# Patient Record
Sex: Female | Born: 1990 | Race: White | Hispanic: No | Marital: Single | Smoking: Current every day smoker
Health system: Southern US, Community
[De-identification: ages and names within clinical notes are randomized; demographics above are authoritative.]

## PROBLEM LIST (undated history)

## (undated) DIAGNOSIS — Z8489 Family history of other specified conditions: Secondary | ICD-10-CM

## (undated) DIAGNOSIS — Z789 Other specified health status: Secondary | ICD-10-CM

---
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
## 2015-03-14 ENCOUNTER — Inpatient Hospital Stay (HOSPITAL_COMMUNITY)
Admission: AD | Admit: 2015-03-14 | Discharge: 2015-03-17 | DRG: 885 | Disposition: A | Discharge status: Home / Self Care | Payer: BLUE CROSS/BLUE SHIELD | Attending: Psychiatry | Admitting: Psychiatry

## 2015-03-14 DIAGNOSIS — F17210 Nicotine dependence, cigarettes, uncomplicated: Secondary | ICD-10-CM | POA: Diagnosis present

## 2015-03-14 DIAGNOSIS — Z818 Family history of other mental and behavioral disorders: Secondary | ICD-10-CM | POA: Diagnosis not present

## 2015-03-14 DIAGNOSIS — F339 Major depressive disorder, recurrent, unspecified: Principal | ICD-10-CM | POA: Diagnosis present

## 2015-03-14 DIAGNOSIS — R45851 Suicidal ideations: Secondary | ICD-10-CM | POA: Diagnosis present

## 2015-03-14 DIAGNOSIS — F329 Major depressive disorder, single episode, unspecified: Secondary | ICD-10-CM | POA: Diagnosis present

## 2015-03-15 ENCOUNTER — Encounter (HOSPITAL_COMMUNITY): Payer: Self-pay

## 2015-03-15 DIAGNOSIS — F339 Major depressive disorder, recurrent, unspecified: Secondary | ICD-10-CM | POA: Diagnosis present

## 2015-03-15 LAB — COMPREHENSIVE METABOLIC PANEL
ALBUMIN: 3.8 g/dL (ref 3.5–5.0)
ALT: 13 U/L — ABNORMAL LOW (ref 14–54)
ANION GAP: 9 (ref 5–15)
AST: 17 U/L (ref 15–41)
Alkaline Phosphatase: 59 U/L (ref 38–126)
BILIRUBIN TOTAL: 0.9 mg/dL (ref 0.3–1.2)
BUN: 13 mg/dL (ref 6–20)
CHLORIDE: 107 mmol/L (ref 101–111)
CO2: 27 mmol/L (ref 22–32)
Calcium: 8.9 mg/dL (ref 8.9–10.3)
Creatinine, Ser: 0.77 mg/dL (ref 0.44–1.00)
GFR calc Af Amer: >60 mL/min (ref >60)
GFR calc non Af Amer: >60 mL/min (ref >60)
GLUCOSE: 92 mg/dL (ref 65–99)
POTASSIUM: 3.9 mmol/L (ref 3.5–5.1)
SODIUM: 143 mmol/L (ref 135–145)
Total Protein: 6.4 g/dL — ABNORMAL LOW (ref 6.5–8.1)

## 2015-03-15 LAB — TSH: TSH: 1.411 u[IU]/mL (ref 0.350–4.500)

## 2015-03-15 LAB — CBC
HEMATOCRIT: 36.2 % (ref 36.0–46.0)
Hemoglobin: 12.2 g/dL (ref 12.0–15.0)
MCH: 30.6 pg (ref 26.0–34.0)
MCHC: 33.7 g/dL (ref 30.0–36.0)
MCV: 90.7 fL (ref 78.0–100.0)
Platelets: 241 10*3/uL (ref 150–400)
RBC: 3.99 MIL/uL (ref 3.87–5.11)
RDW: 12.7 % (ref 11.5–15.5)
WBC: 7.7 10*3/uL (ref 4.0–10.5)

## 2015-03-15 LAB — LIPID PANEL
CHOL/HDL RATIO: 2.7 ratio
Cholesterol: 126 mg/dL (ref 0–200)
HDL: 46 mg/dL (ref >40)
LDL CALC: 66 mg/dL (ref 0–99)
Triglycerides: 71 mg/dL (ref <150)
VLDL: 14 mg/dL (ref 0–40)

## 2015-03-15 LAB — PREGNANCY, URINE: PREG TEST UR: NEGATIVE

## 2015-03-15 LAB — RAPID URINE DRUG SCREEN, HOSP PERFORMED
AMPHETAMINES: NOT DETECTED
Barbiturates: NOT DETECTED
Benzodiazepines: NOT DETECTED
Cocaine: NOT DETECTED
OPIATES: NOT DETECTED
Tetrahydrocannabinol: NOT DETECTED

## 2015-03-15 MED ORDER — SERTRALINE HCL 25 MG PO TABS
25.0000 mg | ORAL_TABLET | Freq: Every day | ORAL | Status: DC
  Administered 2015-03-15: 25 mg via ORAL
  Filled 2015-03-15 (×4): qty 1

## 2015-03-15 MED ORDER — ACETAMINOPHEN 325 MG PO TABS
650.0000 mg | ORAL_TABLET | Freq: Four times a day (QID) | ORAL | Status: DC | PRN
Start: 2015-03-15 — End: 2015-03-17

## 2015-03-15 MED ORDER — NICOTINE 21 MG/24HR TD PT24
21.0000 mg | MEDICATED_PATCH | Freq: Every day | TRANSDERMAL | Status: DC
  Administered 2015-03-15: 21 mg via TRANSDERMAL
  Filled 2015-03-15 (×4): qty 1

## 2015-03-15 MED ORDER — TRAZODONE HCL 50 MG PO TABS
50.0000 mg | ORAL_TABLET | Freq: Every evening | ORAL | Status: DC | PRN
  Administered 2015-03-15 – 2015-03-16 (×2): 50 mg via ORAL
  Filled 2015-03-15 (×2): qty 1

## 2015-03-15 MED ORDER — MAGNESIUM HYDROXIDE 400 MG/5ML PO SUSP
30.0000 mL | Freq: Every day | ORAL | Status: DC | PRN
Start: 2015-03-15 — End: 2015-03-17

## 2015-03-15 MED ORDER — ALUM & MAG HYDROXIDE-SIMETH 200-200-20 MG/5ML PO SUSP: 30.0000 mL | ORAL | Status: DC | PRN

## 2015-03-15 MED ORDER — ARIPIPRAZOLE 2 MG PO TABS
2.0000 mg | ORAL_TABLET | Freq: Every day | ORAL | Status: DC
  Administered 2015-03-15 – 2015-03-16 (×2): 2 mg via ORAL
  Filled 2015-03-15 (×4): qty 1

## 2015-03-15 NOTE — [facility] Group Notes (Signed)
[facility] LCSW Group Therapy 03/15/2015  1:15 pm  Type of Therapy: Group Therapy Participation Level: Active  Participation Quality: Attentive, Sharing and Supportive  Affect: Appropriate  Cognitive: Alert and Oriented  Insight: Developing/Improving and Engaged  Engagement in Therapy: Developing/Improving and Engaged  Modes of Intervention: Clarification, Confrontation, Discussion, Education, Exploration,  Limit-setting, Orientation, Problem-solving, Rapport Building, Dance movement psychotherapist, Socialization and Support  Summary of Progress/Problems: Pt identified obstacles faced currently and processed barriers involved in overcoming these obstacles. Pt identified steps necessary for overcoming these obstacles and explored motivation (internal and external) for facing these difficulties head on. Pt further identified one area of concern in their lives and chose a goal to focus on for today. Patient entered group late. She discussed the importance of artistic expression in coping with obstacles but shared that she has lost interest recently due to her depression. CSW and other group members provided patient with emotional support and encouragement.   Samuella Bruin, LCSW Clinical Social Worker [facility] [phone]

## 2015-03-15 NOTE — Progress Notes (Signed)
Patient ID: Jennifer York, female   DOB: 1990-06-28, 25 y.o.   MRN: [id number] Admission note: D:Patient is a voluntary admission in no acute distress for depression, anxiety and suicide ideation. Pt is a bisexual, Dietitian reports she was prescribed Prozac 20 mg 4 months ago. Pt report she didn't think the medication has been effective because she has been having increase suicidal ideation for the past 2 weeks. Pt stopped taking her medication on 03/12/2015. Pt reports past hx of cutting. Pt endorses passive ideation without a plan, contracts to come to staff. A: Pt admitted to unit per protocol, skin assessment and belonging search done. Pt has Multiple tattoos and piercing. Consent signed by pt. Pt educated on therapeutic milieu rules. Pt was introduced to milieu by nursing staff. Fall risk safety plan explained to the patient. 15 minutes checks started for safety. R: Pt was receptive to education. Writer offered support.

## 2015-03-15 NOTE — H&P (Signed)
Psychiatric Admission Assessment Adult  Patient Identification: Jennifer York MRN:  [id number] Date of Evaluation:  03/15/2015 Chief Complaint:  mdd Principal Diagnosis: Major depressive disorder, recurrent, unspecified ([place]) Diagnosis:   Patient Active Problem List   Diagnosis Date Noted  . Major depressive disorder, recurrent, unspecified ([place]) [F33.9] 03/15/2015   History of Present Illness:Jennifer York is a Caucasian, employed, single 25 y.o. 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 as a walk-in accompanied by 2 friends. She c/o worsening depression with SI (no plan or intent), stating she found herself searching for a gun in her friend's home earlier today. She says she didn't know what she planned to do if she were to actually locate a gun, but it scared her that she had gotten to the point of even seeking one out. She says she wouldn't "rule out" the possibility that she would've ended her life, but she does say that she doesn't think she would've been able to do it. Luckily, the pt was unable to find a gun and instead came to [facility] for help. Pt reports struggling with depression since about age 11 and SI short after. She endorses a hx of self-harm via cutting, beginning at age 34. She reports the last time she cut was about 6 months ago. Pt endorses depressive sx including hopelessness, fluctuating appetite, lack of motivation, fatigue, insomnia, feelings of guilt, loss of interest in usual pleasures, increased irritability, and social isolation. She also c/o anxiety, feeling on edge, and nervous tics (e.g. muscle spasms, facial tics, etc). In addition, she endorses OCD-type sx including obsessive, ruminating thoughts about SI and self-harm, which she worries "may never go away". Compulsive bx include a desire to make things "even" or "symmetrical", but it is unclear at this point if the behaviors are severe enough to warrant OCD dx and will need further evaluation.  Pt  has been under the care of psychiatrist Rosezella Rumpf at [facility] and therapist Leretha Dykes at [facility] for the past year. She has been taking Prozac for the past 4-5 months and feels that it is not helping to control her sx. She has not taken it the past 2 days for this reason but is usually med compliant. In the past, she received outpatient counseling at age 51 for 7 months, but she cannot recall the name of the agency or clinicians. Pt is a Paramedic at [employer] and reports some stressors related to school but nothing out of the ordinary. Other stressors include her belief that her medications are not helping to control her mental health symptoms. She denies any hx of suicide attempt and reports no previous hospitalizations. No hx of violent behavior or legal problems. Pt denies A/VH, HI, and SA. She reports binge drinking at parties every 3-4 months on average with last use last night of an unknown quantity. Family hx is positive for depression, SA, and alcoholism. Pt denies hx of any abuse or trauma. Pt hesitated when asked if she can contract for safety and says she is unsure if she could promise not to act on her thoughts to harm herself. She does feel that inpt tx could be beneficial and is open to it. Pt presents with pleasant mood and level affect. She is smiling and polite throughout assessment and appears to be a good historian. She is oriented x4 and cooperative and forthcoming with information. She maintains good eye-contact and concentration, speech is of normal tone and rate, and thought process is logical and relevant  with no indication of delusional content. Pt does not appear to be responding to internal stimuli and no evidence of psychosis noted.  On Evaluation: Patient is awake, alert,and oriented *4 , found resting in bedroom.  Denies suicidal or homicidal ideation. Denies auditory or visual hallucination and does not appear to be responding to internal stimuli. Patient reports she was  medication compliant with Prozac, however this medication is no longer helping her.  States is has bouts of  Depression and feels like her depression  2/10. Patient reports her last inpatient hospitalization was a few years ago. Support, encouragement and reassurance was provided.   Associated Signs/Symptoms: Depression Symptoms:  depressed mood, difficulty concentrating, hopelessness, suicidal thoughts with specific plan, anxiety, (Hypo) Manic Symptoms:  Irritable Mood, Anxiety Symptoms:  Excessive Worry, Psychotic Symptoms:  Hallucinations: None PTSD Symptoms: Avoidance:  Decreased Interest/Participation Total Time spent with patient: 30 minutes  Past Psychiatric History: See Above  Is the patient at risk to self? No.  Has the patient been a risk to self in the past 6 months? No.  Has the patient been a risk to self within the distant past? No.  Is the patient a risk to others? No.  Has the patient been a risk to others in the past 6 months? No.  Has the patient been a risk to others within the distant past? No.   Prior Inpatient Therapy: Prior Inpatient Therapy: No Prior Therapy Dates: na Prior Therapy Facilty/Provider(s): na Reason for Treatment: na Prior Outpatient Therapy: Prior Outpatient Therapy: Yes Prior Therapy Dates: 2012/2013 (7 months) Prior Therapy Facilty/Provider(s): Unable to recall name(s) - Received counseling Reason for Treatment: Depression Does patient have an ACCT team?: No Does patient have Intensive In-House Services?  : No Does patient have [facility] services? : No Does patient have P4CC services?: No  Alcohol Screening: 1. How often do you have a drink containing alcohol?: Never 9. Have you or someone else been injured as a result of your drinking?: No 10. Has a relative or friend or a doctor or another health worker been concerned about your drinking or suggested you cut down?: No Alcohol Use Disorder Identification Test Final Score (AUDIT): 0 Brief  Intervention: AUDIT score less than 7 or less-screening does not suggest unhealthy drinking-brief intervention not indicated Substance Abuse History in the last 12 months:  No. Consequences of Substance Abuse: NA Previous Psychotropic Medications: NO Psychological Evaluations: YES Past Medical History: History reviewed. No pertinent past medical history. History reviewed. No pertinent past surgical history. Family History:  Family History  Problem Relation Age of Onset  . Depression Mother   . Depression Father   . Depression Brother    Family Psychiatric  History:Unknown Tobacco Screening: @FLOW [phone]::1)@ Social History:  History  Alcohol Use: Not on file     History  Drug Use Not on file    Additional Social History: Marital status: Single    Pain Medications: None, per pt Prescriptions: Prozac Over the Counter: None, per pt History of alcohol / drug use?: No history of alcohol / drug abuse Longest period of sobriety (when/how long): Pt drinking socially once every 3-4 months                    Allergies:  Not on File Lab Results:  Results for orders placed or performed during the hospital encounter of 03/14/15 (from the past 48 hour(s))  CBC     Status: None   Collection Time: 03/15/15  6:41 AM  Result  Value Ref Range   WBC 7.7 4.0 - 10.5 K/uL   RBC 3.99 3.87 - 5.11 MIL/uL   Hemoglobin 12.2 12.0 - 15.0 g/dL   HCT 36.2 36.0 - 46.0 %   MCV 90.7 78.0 - 100.0 fL   MCH 30.6 26.0 - 34.0 pg   MCHC 33.7 30.0 - 36.0 g/dL   RDW 12.7 11.5 - 15.5 %   Platelets 241 150 - 400 K/uL    Comment: Performed at [facility]  Comprehensive metabolic panel     Status: Abnormal   Collection Time: 03/15/15  6:41 AM  Result Value Ref Range   Sodium 143 135 - 145 mmol/L   Potassium 3.9 3.5 - 5.1 mmol/L   Chloride 107 101 - 111 mmol/L   CO2 27 22 - 32 mmol/L   Glucose, Bld 92 65 - 99 mg/dL   BUN 13 6 - 20 mg/dL   Creatinine, Ser 0.77 0.44 - 1.00  mg/dL   Calcium 8.9 8.9 - 10.3 mg/dL   Total Protein 6.4 (L) 6.5 - 8.1 g/dL   Albumin 3.8 3.5 - 5.0 g/dL   AST 17 15 - 41 U/L   ALT 13 (L) 14 - 54 U/L   Alkaline Phosphatase 59 38 - 126 U/L   Total Bilirubin 0.9 0.3 - 1.2 mg/dL   GFR calc non Af Amer >60 >60 mL/min   GFR calc Af Amer >60 >60 mL/min    Comment: (NOTE) The eGFR has been calculated using the CKD EPI equation. This calculation has not been validated in all clinical situations. eGFR's persistently <60 mL/min signify possible Chronic Kidney Disease.    Anion gap 9 5 - 15    Comment: Performed at [facility]  Lipid panel, fasting     Status: None   Collection Time: 03/15/15  6:41 AM  Result Value Ref Range   Cholesterol 126 0 - 200 mg/dL   Triglycerides 71 <150 mg/dL   HDL 46 >40 mg/dL   Total CHOL/HDL Ratio 2.7 RATIO   VLDL 14 0 - 40 mg/dL   LDL Cholesterol 66 0 - 99 mg/dL    Comment:        Total Cholesterol/HDL:CHD Risk Coronary Heart Disease Risk Table                     Men   Women  1/2 Average Risk   3.4   3.3  Average Risk       5.0   4.4  2 X Average Risk   9.6   7.1  3 X Average Risk  23.4   11.0        Use the calculated Patient Ratio above and the CHD Risk Table to determine the patient's CHD Risk.        ATP III CLASSIFICATION (LDL):  <100     mg/dL   Optimal  100-129  mg/dL   Near or Above                    Optimal  130-159  mg/dL   Borderline  160-189  mg/dL   High  >190     mg/dL   Very High Performed at [facility]   TSH     Status: None   Collection Time: 03/15/15  6:41 AM  Result Value Ref Range   TSH 1.411 0.350 - 4.500 uIU/mL    Comment: Performed at [facility]  Blood Alcohol level:  No results found for: [facility]  Metabolic Disorder Labs:  No results found for: HGBA1C, MPG No results found for: PROLACTIN Lab Results  Component Value Date   CHOL 126 03/15/2015   TRIG 71 03/15/2015   HDL 46 03/15/2015   CHOLHDL 2.7 03/15/2015    VLDL 14 03/15/2015   LDLCALC 66 03/15/2015    Current Medications: Current Facility-Administered Medications  Medication Dose Route Frequency Provider Last Rate Last Dose  . acetaminophen (TYLENOL) tablet 650 mg  650 mg Oral Q6H PRN Harriet Butte, NP      . alum & mag hydroxide-simeth (MAALOX/MYLANTA) 200-200-20 MG/5ML suspension 30 mL  30 mL Oral Q4H PRN Harriet Butte, NP      . ARIPiprazole (ABILIFY) tablet 2 mg  2 mg Oral QHS Saramma Eappen, MD      . magnesium hydroxide (MILK OF MAGNESIA) suspension 30 mL  30 mL Oral Daily PRN Harriet Butte, NP      . nicotine (NICODERM CQ - dosed in mg/24 hours) patch 21 mg  21 mg Transdermal Daily Encarnacion Slates, NP   21 mg at 03/15/15 1358  . sertraline (ZOLOFT) tablet 25 mg  25 mg Oral Daily Saramma Eappen, MD   25 mg at 03/15/15 1315  . traZODone (DESYREL) tablet 50 mg  50 mg Oral QHS PRN Harriet Butte, NP   50 mg at 03/15/15 0100   PTA Medications: Prescriptions prior to admission  Medication Sig Dispense Refill Last Dose  . FLUoxetine (PROZAC) 20 MG capsule Take 20 mg by mouth daily.   03/12/2015    Musculoskeletal: Strength & Muscle Tone: within normal limits Gait & Station: normal Patient leans: N/A  Psychiatric Specialty Exam: Physical Exam  Vitals reviewed. Constitutional: She is oriented to person, place, and time. She appears well-developed.  Cardiovascular: Normal rate.   Musculoskeletal: Normal range of motion.  Neurological: She is alert and oriented to person, place, and time.  Skin: Skin is dry.  Psychiatric: She has a normal mood and affect. Her behavior is normal.    Review of Systems  Psychiatric/Behavioral: Positive for depression and suicidal ideas. The patient is nervous/anxious.   All other systems reviewed and are negative.   Blood pressure 95/62, pulse 90, temperature 98.6 F (37 C), temperature source Oral, resp. rate 16, height 5' 5"  (1.651 m), weight 66.225 kg (146 lb).Body mass index is 24.3  kg/(m^2).  General Appearance: Casual  Eye Contact::  Good  Speech:  Clear and Coherent  Volume:  Normal  Mood:  Anxious  Affect:  Congruent  Thought Process:  Coherent, Intact and Logical  Orientation:  Full (Time, Place, and Person)  Thought Content:  Hallucinations: None  Suicidal Thoughts:  No denies ideations today  Homicidal Thoughts:  No  Memory:  Immediate;   Fair Recent;   Fair Remote;   Fair  Judgement:  Fair  Insight:  Fair  Psychomotor Activity:  Normal  Concentration:  Fair  Recall:  [employer] of Knowledge:Fair  Language: Fair  Akathisia:  No  Handed:  Right  AIMS (if indicated):     Assets:  Desire for Improvement Social Support  ADL's:  Intact  Cognition: WNL  Sleep:  Number of Hours: 3.25     MDTreatment Plan Summary: Daily contact with patient to assess and evaluate symptoms and progress in treatment and Medication management  Patient will benefit from inpatient treatment and stabilization.  Estimated length of stay is 5-7 days.  Reviewed past medical records,treatment plan.  Will DC Prozac for lack of efficacy as well as patient feels it made her more suicidal. Will start a trial of Zoloft 25 mg po daily for affective sx. Will add Abilify 2 mg po qhs to augment the effect of Zoloft as well as for mood sx. Could titrate higher as needed. Will make available PRN medications for anxiety /agitation.  Observation Level/Precautions:  15 minute checks  Laboratory:  CBC Chemistry Profile UDS UA- Lipid, pregnancy, prolactin , TSH, EKG pending results  Psychotherapy:  Individual and group session  Medications:  See above  Consultations:  Psychiatry  Discharge Concerns:  Safety, stabilization, and risk of access to medication and medication stabilization   Estimated LOS: 5-7 days  Other:     I certify that inpatient services furnished can reasonably be expected to improve the patient's condition.    Derrill Center, NP 2/20/20173:37 PM

## 2015-03-15 NOTE — [facility] Suicide Risk Assessment (Signed)
[facility] Admission Suicide Risk Assessment   Nursing information obtained from:  Patient, Review of record Demographic factors:  Adolescent or young adult, Caucasian, Gay, lesbian, or bisexual orientation Current Mental Status:  Self-harm thoughts, Suicidal ideation indicated by patient, Intention to act on suicide plan Loss Factors:  NA Historical Factors:  Prior suicide attempts Risk Reduction Factors:  Employed  Total Time spent with patient: 30 minutes Principal Problem: Major depressive disorder, recurrent, unspecified (HCC); R/O Bipolar type II versus Borderline personality do  Diagnosis:   Patient Active Problem List   Diagnosis Date Noted  . Major depressive disorder, recurrent, unspecified (HCC) [F33.9] 03/15/2015   Subjective Data: Patient states " I am always on the edge , I have a lot of anxiety sx. I also have periods when I am having a burst of energy and I am more functional and also periods when I feel down and depressed. I am chronically suicidal all the time , but that has been worsening to the point that I felt I needed to find a gun and shoot myself. I think Prozac was making my depression worse , but I was on the same dose for a long time.'   Continued Clinical Symptoms:  Alcohol Use Disorder Identification Test Final Score (AUDIT): 0 The "Alcohol Use Disorders Identification Test", Guidelines for Use in Primary Care, Second Edition.  World Science writer [facility]). Score between 0-7:  no or low risk or alcohol related problems. Score between 8-15:  moderate risk of alcohol related problems. Score between 16-19:  high risk of alcohol related problems. Score 20 or above:  warrants further diagnostic evaluation for alcohol dependence and treatment.   CLINICAL FACTORS:   Severe Anxiety and/or Agitation Alcohol/Substance Abuse/Dependencies Unstable or Poor Therapeutic Relationship   Musculoskeletal: Strength & Muscle Tone: within normal limits Gait & Station:  normal Patient leans: N/A  Psychiatric Specialty Exam: Review of Systems  Psychiatric/Behavioral: Positive for depression. The patient is nervous/anxious.   All other systems reviewed and are negative.   Blood pressure 95/62, pulse 90, temperature 98.6 F (37 C), temperature source Oral, resp. rate 16, height  (1.651 m), weight 66.225 kg (146 lb).Body mass index is 24.3 kg/(m^2).  General Appearance: Casual  Eye Contact::  Fair  Speech:  Clear and Coherent  Volume:  Normal  Mood:  Anxious and Depressed  Affect:  Appropriate  Thought Process:  Coherent  Orientation:  Full (Time, Place, and Person)  Thought Content:  Rumination  Suicidal Thoughts:  yes , chronic SI , had plan to shoot self prior to admission  Homicidal Thoughts:  No  Memory:  Immediate;   Fair Recent;   Fair Remote;   Fair  Judgement:  Fair  Insight:  Fair  Psychomotor Activity:  Normal  Concentration:  Fair  Recall:  [employer] of Knowledge:Fair  Language: Fair  Akathisia:  No    AIMS (if indicated):     Assets:  Desire for Improvement  Sleep:  Number of Hours: 3.25  Cognition: WNL  ADL's:  Intact    COGNITIVE FEATURES THAT CONTRIBUTE TO RISK:  Closed-mindedness, Polarized thinking and Thought constriction (tunnel vision)    SUICIDE RISK:   Moderate:  Frequent suicidal ideation with limited intensity, and duration, some specificity in terms of plans, no associated intent, good self-control, limited dysphoria/symptomatology, some risk factors present, and identifiable protective factors, including available and accessible social support.  PLAN OF CARE: Patient with chronic suicidality , some self injurious behavior in the past ,  mood sx - has periods of depression and ? Hypomania ( she is able to function at school and work during those times) - binges on alcohol on and off , and has anxiety sx, always feels as though she is on edge.   Hence will R/O Bipolar disorder type II, Versus Borderline  personality do/traits.    Patient will benefit from inpatient treatment and stabilization.  Estimated length of stay is 5-7 days.  Reviewed past medical records,treatment plan.  Will DC Prozac for lack of efficacy as well as patient feels it made her more suicidal. Will start a trial of Zoloft 25 mg po daily for affective sx. Will add Abilify 2 mg po qhs to augment the effect of Zoloft as well as for mood sx. Could titrate higher as needed. Will make available PRN medications for anxiety /agitation. Case discussed with Alcario Drought NP - please also see H&P for plan.    I certify that inpatient services furnished can reasonably be expected to improve the patient's condition.   Amelda Hapke, MD 03/15/2015, 12:45 PM

## 2015-03-15 NOTE — Plan of Care (Signed)
Problem: Ineffective individual coping Goal: STG: Patient will remain free from self harm Outcome: Progressing Remains free at this time, denies SI

## 2015-03-15 NOTE — B[facility] Counselor (Signed)
Disposition: Per Hulan Fess, NP, Pt meet inpt tx criteria. Per Binnie Rail, AC, Pt accepted to [facility] bed 404-2 under the care of Dr Jama Flavors. Pt informed of disposition and is in agreement. Voluntary paperwork completed.

## 2015-03-15 NOTE — Tx Team (Signed)
Initial Interdisciplinary Treatment Plan   PATIENT STRESSORS: Educational concerns Medication change or noncompliance   PATIENT STRENGTHS: Ability for insight Average or above average intelligence Capable of independent living Communication skills General fund of knowledge Motivation for treatment/growth   PROBLEM LIST: Problem List/Patient Goals Date to be addressed Date deferred Reason deferred Estimated date of resolution  depression 03/15/2015     Risk for suicide 03/15/2015     Medication noncompliance 03/15/2015     anxiety 03/15/2015     "learn better coping skills" 03/15/2015                              DISCHARGE CRITERIA:  Ability to meet basic life and health needs Improved stabilization in mood, thinking, and/or behavior Need for constant or close observation no longer present  PRELIMINARY DISCHARGE PLAN: Return to previous living arrangement Return to previous work or school arrangements  PATIENT/FAMIILY INVOLVEMENT: This treatment plan has been presented to and reviewed with the patient, Jennifer York,  The patient and family have been given the opportunity to ask questions and make suggestions.  [name], [name] 03/15/2015, 2:29 AM

## 2015-03-15 NOTE — [facility] Group Notes (Signed)
[facility] LCSW Aftercare Discharge Planning Group Note  03/15/2015  8:45 AM  Participation Quality: Did Not Attend. Patient invited to participate but declined.  Samuella Bruin, MSW, LCSW Clinical Social Worker [facility] [phone]

## 2015-03-15 NOTE — BH Assessment (Addendum)
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 She c/o worsening depression with SI (no plan or intent), stating she found herself searching for a gun in her friend's home earlier today. She says she didn't know what she planned to do if she were to actually locate a gun, but it scared her that she had gotten to the point of even seeking one out. She says she wouldn't "rule out" the possibility that she would've ended her life, but she does say that she doesn't think she would've been able to do it. Luckily, the pt was unable to find a gun and instead came to [facility] for help. Pt reports struggling with depression since about age 17 and SI short after. She endorses a hx of self-harm via cutting, beginning at age 31. She reports the last time she cut was about 6 months ago. Pt endorses depressive sx including hopelessness, fluctuating appetite, lack of motivation, fatigue, insomnia, feelings of guilt, loss of interest in usual pleasures, increased irritability, and social isolation. She also c/o anxiety, feeling on edge, and nervous tics (e.g. muscle spasms, facial tics, etc). In addition, she endorses OCD-type sx including obsessive, ruminating thoughts about SI and self-harm, which she worries "may never go away". Compulsive bx include a desire to make things "even" or "symmetrical", but it is unclear at this point if the behaviors are severe enough to warrant OCD dx and will need further evaluation.  Pt has been under the care of psychiatrist Bertis Ruddy at [facility] and therapist Ernest Haber at [facility] for the past year. She has been taking Prozac for the past 4-5 months and feels that it is not helping to control her sx. She has not taken it the past 2 days for this reason but is usually med compliant. In the past, she received outpatient counseling at age 36 for 7  months, but she cannot recall the name of the agency or clinicians. Pt is a Holiday representative at [employer] and reports some stressors related to school but nothing out of the ordinary. Other stressors include her belief that her medications are not helping to control her mental health symptoms. She denies any hx of suicide attempt and reports no previous hospitalizations. No hx of violent behavior or legal problems. Pt denies A/VH, HI, and SA. She reports binge drinking at parties every 3-4 months on average with last use last night of an unknown quantity. Family hx is positive for depression, SA, and alcoholism. Pt denies hx of any abuse or trauma. Pt hesitated when asked if she can contract for safety and says she is unsure if she could promise not to act on her thoughts to harm herself. She does feel that inpt tx could be beneficial and is open to it. Pt presents with pleasant mood and level affect. She is smiling and polite throughout assessment and appears to be a good historian. She is oriented x4 and cooperative and forthcoming with information. She maintains good eye-contact and concentration, speech is of normal tone and rate, and thought process is logical and relevant with no indication of delusional content. Pt does not appear to be responding to internal stimuli and no evidence of psychosis noted.  - Disposition: Per Hulan Fess, NP, Pt meet inpt tx criteria. Per Binnie Rail, AC, Pt accepted to [facility] bed 404-2 under the care of Dr Jama Flavors.  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  No past surgical history on file.  Family History: No family history on file.  Social History:  has no tobacco, alcohol, and drug history on file.  Additional Social History:  Alcohol / Drug Use Pain Medications: None, per pt Prescriptions: Prozac Over the Counter: None, per  pt History of alcohol / drug use?: No history of alcohol / drug abuse Longest period of sobriety (when/how long): Pt drinking socially once every 3-4 months  CIWA:   COWS:    PATIENT STRENGTHS: (choose at least two) Ability for insight Active sense of humor Average or above average intelligence Capable of independent living Communication skills Motivation for treatment/growth Physical Health Supportive family/friends Work skills  Allergies: Allergies not on file  Home Medications:  No prescriptions prior to admission    OB/GYN Status:  No LMP recorded.  General Assessment Data Location of Assessment: [facility] Assessment Services TTS Assessment: In system Is this a Tele or Face-to-Face Assessment?: Face-to-Face Is this an Initial Assessment or a Re-assessment for this encounter?: Initial Assessment Marital status: Single Maiden name: n/a Is patient pregnant?: No Pregnancy Status: No Living Arrangements: Non-relatives/Friends, Other (Comment) (Alternates b/t living w/family and friends) Can pt return to current living arrangement?: Yes Admission Status: Voluntary Is patient capable of signing voluntary admission?: Yes Referral Source: Self/Family/Friend Insurance type: BCBS-Student  Medical Screening Exam [facility] Walk-in ONLY) Medical Exam completed: No Reason for MSE not completed: Other:  Crisis Care Plan Living Arrangements: Non-relatives/Friends, Other (Comment) (Alternates b/t living w/family and friends) Legal Guardian:  (N/A) Name of Psychiatrist: [facility] Bertis Ruddy) Name of Therapist: Haroldine Laws Cristino Martes)  Education Status Is patient currently in school?: Yes Current Grade: Junior Highest grade of school patient has completed: Sophomore yr of college Name of school: Haematologist person: Patient  Risk to self with the past 6 months Suicidal Ideation: Yes-Currently Present Has patient been a risk to self within the past 6 months prior to admission? :  Yes Suicidal Intent: No Has patient had any suicidal intent within the past 6 months prior to admission? : No Is patient at risk for suicide?: Yes Suicidal Plan?: Yes-Currently Present Has patient had any suicidal plan within the past 6 months prior to admission? : No Specify Current Suicidal Plan: Pt was looking for gun in her friend's house earlier today Access to Means: No Specify Access to Suicidal Means: Friends have firearms/guns but pt has none of own What has been your use of drugs/alcohol within the last 12 months?: Social drinking at parties once every 3-4 months Previous Attempts/Gestures: No How many times?: 0 Other Self Harm Risks: None known Triggers for Past Attempts: None known Intentional Self Injurious Behavior: None Family Suicide History: No Recent stressful life event(s): Financial Problems, Other (Comment) (School and financial stressors, Feels meds not helping) Persecutory voices/beliefs?: No Depression: Yes Depression Symptoms: Despondent, Insomnia, Isolating, Fatigue, Guilt, Loss of interest in usual pleasures, Feeling angry/irritable (+ fluctuating appetite, lack of motivation, hopelessness) Substance abuse history and/or treatment for substance abuse?: No Suicide prevention information given to non-admitted patients: Not applicable  Risk to Others within the past 6 months Homicidal Ideation: No Does patient have any lifetime risk of violence toward others beyond the six months prior to admission? : No Thoughts of Harm to Others: No Current Homicidal Intent: No Current Homicidal Plan: No Access to Homicidal Means: No Identified Victim:  n/a History of harm to others?: No Assessment of Violence: None Noted Violent Behavior Description: No hx of violence; Pt very calm and cooperative Does patient have access to weapons?: No Criminal Charges Pending?: No Does patient have a court date: No Is patient on probation?: No  Psychosis Hallucinations: None  noted Delusions: None noted  Mental Status Report Appearance/Hygiene: Other (Comment) (Tank top, shorts, multiple tattoos; Unique) Eye Contact: Good Motor Activity: Freedom of movement Speech: Logical/coherent Level of Consciousness: Alert Mood: Pleasant Affect: Inconsistent with thought content, Other (Comment) (Smiling, pleasant, level affect) Anxiety Level: Minimal Thought Processes: Coherent, Relevant Judgement: Partial Orientation: Person, Place, Time, Situation Obsessive Compulsive Thoughts/Behaviors: Moderate (Rumination on morbid thoughts, thoughts of death/self-harm)  Cognitive Functioning Concentration: Normal Memory: Recent Intact, Remote Intact IQ: Average Insight: Fair Impulse Control: Fair Appetite: Fair (Fluctuates b/t lack of appetite and overeating) Weight Loss: 0 Weight Gain: 0 Sleep: Decreased Total Hours of Sleep: 6 Vegetative Symptoms: None  ADLScreening [facility] Assessment Services) Patient's cognitive ability adequate to safely complete daily activities?: Yes Patient able to express need for assistance with ADLs?: Yes Independently performs ADLs?: Yes (appropriate for developmental age)  Prior Inpatient Therapy Prior Inpatient Therapy: No Prior Therapy Dates: na Prior Therapy Facilty/Provider(s): na Reason for Treatment: na  Prior Outpatient Therapy Prior Outpatient Therapy: Yes Prior Therapy Dates: 2012/2013 (7 months) Prior Therapy Facilty/Provider(s): Unable to recall name(s) - Received counseling Reason for Treatment: Depression Does patient have an ACCT team?: No Does patient have Intensive In-House Services?  : No Does patient have [facility] services? : No Does patient have P4CC services?: No  ADL Screening (condition at time of admission) Patient's cognitive ability adequate to safely complete daily activities?: Yes Is the patient deaf or have difficulty hearing?: No Does the patient have difficulty seeing, even when wearing  glasses/contacts?: No Does the patient have difficulty concentrating, remembering, or making decisions?: No Patient able to express need for assistance with ADLs?: Yes Does the patient have difficulty dressing or bathing?: No Independently performs ADLs?: Yes (appropriate for developmental age) Does the patient have difficulty walking or climbing stairs?: No Weakness of Legs: None Weakness of Arms/Hands: None  Home Assistive Devices/Equipment Home Assistive Devices/Equipment: None    Abuse/Neglect Assessment (Assessment to be complete while patient is alone) Physical Abuse: Denies Verbal Abuse: Denies Sexual Abuse: Denies Exploitation of patient/patient's resources: Denies Self-Neglect: Denies Values / Beliefs Cultural Requests During Hospitalization: None Spiritual Requests During Hospitalization: None   Advance Directives (For Healthcare) Does patient have an advance directive?: No Would patient like information on creating an advanced directive?: No - patient declined information    Additional Information 1:1 In Past 12 Months?: No CIRT Risk: No Elopement Risk: No Does patient have medical clearance?: No     Disposition: Per Hulan Fess, NP, Pt meet inpt tx criteria. Per Binnie Rail, AC, Pt accepted to [facility] bed 404-2 under the care of Dr Jama Flavors. Disposition Initial Assessment Completed for this Encounter: Yes Disposition of Patient: Inpatient treatment program Type of inpatient treatment program: Adult (Per Hulan Fess, NP, Inpt tx, 400 hall recommended.)  Cyndie Mull, [facility]  [facility] - TTS 03/15/2015 1:25 AM

## 2015-03-15 NOTE — BHH Counselor (Signed)
Jennifer York is a white, single 25 y.o. 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 Pt c/o worsening depression with SI and ruminating thoughts about self-harm. No plan or intent. She says she found herself searching for a gun in her friend's home earlier today. She didn't know what she had planned to do if she were to actually locate a gun, but it scared her that she was even seeking one out. Luckily, the pt was unable to find a gun and instead came to [facility] for help. She has no hx of suicide attempt or prior hospitalization. She is pleasant, open, and oriented x4. Normal thought process and speech. No indication of psychosis.  Pt wants inpatient treatment but is concerned about her snoring and that it will upset her roommate. Pt was reassured that staff would do whatever they could to address these concerns if they were to arise (e.g. give roommate ear plugs, etc). Pt voiced understanding and thanked counselor for her help.  Pt assigned to bed [facility] 404-2 under Dr Jama Flavors.   - Cyndie Mull, [facility]   Therapeutic Triage    [facility]

## 2015-03-15 NOTE — Progress Notes (Signed)
Patient ID: Jennifer York, female   DOB: [birth year], 25 y.o.   MRN: [id number]  DAR: Writer first met patient this morning when patient was in her bed laying down. She is very pleasant and polite with Probation officer. Her affect is bright and mood is euthymic. Pt. Denies SI/HI and A/V Hallucinations. She reports sleep last night was fair, energy level low, appetite is fair, and concentration is good. She rates depression, anxiety, and hopelessness 6/10. Patient does not report any pain or discomfort at this time. Support and encouragement provided to the patient to come to writer with any questions or concerns. No scheduled medications at this time and PRN medications not needed at this time either. Patient is receptive and cooperative. Q15 minute checks are maintained for safety.

## 2015-03-16 MED ORDER — SERTRALINE HCL 50 MG PO TABS
50.0000 mg | ORAL_TABLET | Freq: Every day | ORAL | Status: DC
  Administered 2015-03-17: 50 mg via ORAL
  Filled 2015-03-16 (×3): qty 1

## 2015-03-16 NOTE — Progress Notes (Signed)
D: Patient observed interacting with staff and peers. Patient states goal for the day was to " learn about medication and what facility has to offer her. " Patient in pleasant mood. A: Patient offered support and encouragement and questions answered. Q 15 minute checks in progress and maintained. R: Patient remains safe on unit and monitoring continues.

## 2015-03-16 NOTE — Progress Notes (Signed)
Writer visualized patient hugging another patient.  Writer waited for patient to go in her room, spoke to her alone and informed her that touching another patient is prohibited.  Patient stated, "What If I am using it for my comfort?"  Writer educated patient on why is is not allowed.  Patient voiced understanding.   

## 2015-03-16 NOTE — Progress Notes (Signed)
   03/16/15 0645  What Happened  Was fall witnessed? Yes  Who witnessed fall? Jerrel Ivory RN  Patients activity before fall ambulating-unassisted  Point of contact other (comment) (fall assisted by staff)  Was patient injured? No  Follow Up  MD notified Karleen Hampshire PA  Time MD notified [phone]  Family notified No- patient refusal  Additional tests No  Adult Fall Risk Assessment  Risk Factor Category (scoring not indicated) High fall risk per protocol (document High fall risk)  Patient's Fall Risk High Fall Risk (>13 points)  Adult Fall Risk Interventions  Required Bundle Interventions *See Row Information* High fall risk - low, moderate, and high requirements implemented  Additional Interventions Assess orthostatic BP;Fall risk signage;Specialty bed:  Low bed  Fall with Injury Screening  Risk For Fall Injury- See Row Information  Nurse judgement  Intervention(s) for 2 or more risk criteria identified Low Bed  Vitals  Temp 97.8 F (36.6 C)  Temp Source Oral  BP (!) 100/48 mmHg  BP Location Left Arm  BP Method Automatic  Patient Position (if appropriate) Sitting  Pulse Rate 64  Pulse Rate Source Dinamap  Resp 16  Oxygen Therapy  SpO2 99 %  O2 Device Room Air  Neurological  Neuro (WDL) WDL  Musculoskeletal  Musculoskeletal (WDL) WDL  Integumentary  Integumentary (WDL) WDL

## 2015-03-16 NOTE — Tx Team (Signed)
Interdisciplinary Treatment Plan Update (Adult) Date: 03/16/2015    Time Reviewed: 9:30 AM  Progress in Treatment: Attending groups: Continuing to assess, patient new to milieu Participating in groups: Continuing to assess, patient new to milieu Taking medication as prescribed: Yes Tolerating medication: Yes Family/Significant other contact made: No, CSW assessing for appropriate contacts Patient understands diagnosis: Yes Discussing patient identified problems/goals with staff: Yes Medical problems stabilized or resolved: Yes Denies suicidal/homicidal ideation: Yes Issues/concerns per patient self-inventory: Yes Other:  New problem(s) identified: N/A  Discharge Plan or Barriers: CSW continuing to assess, patient new to milieu.  Reason for Continuation of Hospitalization:  Depression Anxiety Medication Stabilization   Comments: N/A  Estimated length of stay: 3-5 days    Patient is a 25 year old female with a diagnosis of Major Depressive Disorder. Pt presented to the hospital with suicidal ideations. No primary trigger(s) for admission was identified. Patient will benefit from crisis stabilization, medication evaluation, group therapy and psycho education in addition to case management for discharge planning. At discharge, it is recommended that Pt remain compliant with established discharge plan and continued treatment.   Review of initial/current patient goals per problem list:  1. Goal(s): Patient will participate in aftercare plan   Met: Yes   Target date: 3-5 days post admission date   As evidenced by: Patient will participate within aftercare plan AEB aftercare provider and housing plan at discharge being identified.   2/21: Goal met. Patient will return home to follow up with outpatient services.    2. Goal (s): Patient will exhibit decreased depressive symptoms and suicidal ideations.   Met: No   Target date: 3-5 days post admission date   As  evidenced by: Patient will utilize self rating of depression at 3 or below and demonstrate decreased signs of depression or be deemed stable for discharge by MD.   2/21: Goal not met: Pt presents with flat affect and depressed mood.  Pt admitted with depression rating of 10.  Pt to show decreased sign of depression and a rating of 3 or less before d/c.      Attendees: Patient:    Family:    Physician: Dr. Parke Poisson; Dr. Sabra Heck 03/16/2015 9:30 AM  Nursing: Mayra Neer, Loletta Specter, [address] [place], [place] 03/16/2015 9:30 AM  Clinical Social Worker: Tilden Fossa, LCSW 03/16/2015 9:30 AM  Other: Peri Maris, LCSWA; [place], LCSW  03/16/2015 9:30 AM  Other:  03/16/2015 9:30 AM  Other: Lars Pinks, Case Manager 03/16/2015 9:30 AM  Other: Agustina Caroli, NP 03/16/2015 9:30 AM  Other:    Other:      Scribe for Treatment Team:  Tilden Fossa, [place]

## 2015-03-16 NOTE — Progress Notes (Addendum)
Patient was ambulating in hall unassisted to have labs collected and prior to entering room became light headed and began to fall and fall was assisted by staff . Patient sitting on floor with feeling of nausea states she feels fall and light headedness is related to lab collection and not wanting to get them collected. Patient with no noted injuries. Patient with ROM to all extremities and no complaints of pain or discomfort noted with movement. Patient denies pain at this time. Patient grips are equal, PERLA, ROM  (WNL), patient alert and oriented. Karleen Hampshire PA notified and [facility] Tori notified. No new orders received. Patient assisted to room and bathroom and back to bed. Patient educated on fall prevention and changing in position slowly. Monitoring continues. Vitals 100/48-64-16-99% 97.8 and denies pain.

## 2015-03-16 NOTE — Progress Notes (Signed)
Recreation Therapy Notes  Animal-Assisted Activity (AAA) Program Checklist/Progress Notes Patient Eligibility Criteria Checklist & Daily Group note for Rec Tx Intervention  Date: 02.21.2017 Time: 2:45pm Location: [address]   AAA/T Program Assumption of Risk Form signed by Patient/ or Parent Legal Guardian yes  Patient is free of allergies or sever asthma yes  Patient reports no fear of animals yes  Patient reports no history of cruelty to animals yes  Patient understands his/her participation is voluntary yes  Behavioral Response: Did not attend.   Jennifer York, LRT/CTRS  Jennifer York 03/16/2015 3:15 PM

## 2015-03-16 NOTE — Plan of Care (Signed)
Problem: Alteration in mood Goal: STG-Patient reports thoughts of self-harm to staff Outcome: Progressing Jennifer York denies SI or thoughts of self harm

## 2015-03-16 NOTE — Progress Notes (Signed)
Patient ID: Jennifer York, female   DOB: 07-Sep-1990, 24 y.o.   MRN: [id number]  DAR: Pt. Denies SI/HI and A/V Hallucinations. She reports sleep is fair, appetite is good, energy level is normal, and concentration is good. She rates depression 2/10, hopelessness 1/10, and anxiety 2/10. Patient does not report any pain however did have an episode of vomitus during lunch. Patient reported feeling better afterwards. She reported some lightheadedness and BP has remained low. Writer encouraged patient to drink plenty of fluids and reminded her of safety when standing and walking. Patient verbalized understanding and reported feeling better in the afternoon. Support and encouragement provided to the patient. Patient did not take medications this morning. Patient is minimal and mostly staying in her room today. She was encouraged to come into the milieu. She reports, "you all are nice and everything but I think I am ready to leave." Voluntary commitment was explained and patient was notified that she could sign a 72 hour request for discharge. However has not at this time. Q15 minute checks are maintained for safety.

## 2015-03-16 NOTE — [facility] Counselor (Signed)
Adult Comprehensive Assessment  Patient ID: Jennifer York, female   DOB: 12-09-1990, 25 y.o.   MRN: [id number]  Information Source: Information source: Patient  Current Stressors:  Educational / Learning stressors: junior/senior at [employer] [place], reports that school can be stressful Employment / Job issues: works at [employer] for 1 month Family Relationships: Reports a good relationship with her family Surveyor, quantity / Lack of resources (include bankruptcy): receives financial support from family and significant others Housing / Lack of housing: lives between parents home and with her 2 partners Physical health (include injuries & life threatening diseases): Denies Social relationships: Denies Substance abuse: infrequent/social drinker  Bereavement / Loss: still dealing with break up from 2 years ago  Living/Environment/Situation:  Living Arrangements: Spouse/significant other, Parent Living conditions (as described by patient or guardian): lives between parents home and with her 2 partners How long has patient lived in current situation?: 7 months What is atmosphere in current home: Comfortable  Family History:  Marital status: Long term relationship Long term relationship, how long?: 7 months What types of issues is patient dealing with in the relationship?: in an open relationship with a boyfriend and girlfriend Does patient have children?: No  Childhood History:  By whom was/is the patient raised?: Both parents Description of patient's relationship with caregiver when they were a child: parents are conservative Christians, butted heads with them when a teenager Patient's description of current relationship with people who raised him/her: Parents are supportive  Does patient have siblings?: Yes Number of Siblings: 1 Description of patient's current relationship with siblings: Close with brother Did patient suffer any verbal/emotional/physical/sexual abuse as  a child?: No Did patient suffer from severe childhood neglect?: No Has patient ever been sexually abused/assaulted/raped as an adolescent or adult?: No Was the patient ever a victim of a crime or a disaster?: No Witnessed domestic violence?: No Has patient been effected by domestic violence as an adult?: No  Education:  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 person: Patient Learning disability?: No  Employment/Work Situation:   Employment situation: Employed Where is patient currently employed?: [employer] long has patient been employed?: 1 month Patient's job has been impacted by current illness: No What is the longest time patient has a held a job?: 8 months 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 Has patient ever been in the [employer]?: No  Financial Resources:   Financial resources: Income from employment, Support from parents / caregiver Does patient have a Lawyer or guardian?: No  Alcohol/Substance Abuse:   What has been your use of drugs/alcohol within the last 12 months?: infrequent/social drinker  If attempted suicide, did drugs/alcohol play a role in this?: No Alcohol/Substance Abuse Treatment Hx: Denies past history Has alcohol/substance abuse ever caused legal problems?: No  Social Support System:   Conservation officer, nature Support System: Good Describe Community Support System: friends, family, significant others Type of faith/religion: Spiritual How does patient's faith help to cope with current illness?: Doesn't find it helpful at this time  Leisure/Recreation:   Leisure and Hobbies: Company secretary  Strengths/Needs:   What things does the patient do well?: sociable, helping others In what areas does patient struggle / problems for patient: negative self-talk  Discharge Plan:   Does patient have access to transportation?: Yes Will  patient be returning to same living situation after discharge?: Yes Currently receiving community mental health services: Yes (From  Whom) [facility]) If no, would patient like referral for services when discharged?: No Does patient have financial barriers related to discharge medications?: No  Summary/Recommendations:     Patient is a 25 year old female with a diagnosis of Major Depressive Disorder . Pt presented to the hospital with suicidal ideations. Pt denies primary trigger(s) for admission. Patient will benefit from crisis stabilization, medication evaluation, group therapy and psycho education in addition to case management for discharge planning. At discharge, it is recommended that Pt remain compliant with established discharge plan and continued treatment.   Jennifer York, [place] 03/16/2015

## 2015-03-16 NOTE — [facility] Group Notes (Signed)
[facility] Group Notes:  (Nursing/MHT/Case Management/Adjunct)  Date:  03/16/2015  Time:  11:32 AM  Type of Therapy:  Psychoeducational Skills  Participation Level:  Did Not Attend  Participation Quality:  N/A  Affect:  N/A  Cognitive:  N/A  Insight:  None  Engagement in Group:  None  Modes of Intervention:  Discussion and Education  Summary of Progress/Problems: Patient was invited to group but chose not to attend.   Arik Husmann E 03/16/2015, 11:32 AM 

## 2015-03-16 NOTE — [facility] Group Notes (Signed)
[facility] LCSW Group Therapy 03/16/2015  1:15 PM   Type of Therapy: Group Therapy  Participation Level: Did Not Attend. Patient invited to participate but declined.   Abbye Lao, MSW, LCSW Clinical Social Worker [facility] [phone]   

## 2015-03-16 NOTE — Progress Notes (Signed)
[facility] MD Progress Note  03/16/2015 8:35 PM Jennifer York  MRN:  [id number] Subjective:   Patient reports she is feeling better  Today. Reports history of mood disorder, mainly depression, but also reports short lived mood swings, with  Short episodes of increased energy, increased activity, feeling more productive and efficient. These episodes are usually of short duration. Does not endorse clear history of manic episodes . Denies medication side effects thus far.  Objective : I have discussed case with treatment team and have met with patient . Patient is a 25 year old female, college student, who presented to ED due to suicidal ideations, depression, and recent episode of looking for a gun at a friend's house  , although without any specific plan to shoot self. Reports history of depression and brief but sometimes intense mood swings. Has been on Prozac x several weeks, states initially helpful but at this time concerned that Prozac may have increased impulsivity and suicidal ruminations . Currently presents calm, does not report current  symptoms of mania or hypomania, although does report poor sleep- no pressured speech, no irritability. Visible on unit, limited group participation and limited  Interaction with  peers .  Episode of dizziness this AM , but later improved . At this time not dizzy or lightheaded, and gait steady. Denies any current or ongoing SI.  Principal Problem: Major depressive disorder, recurrent, unspecified ([place]) Diagnosis:   Patient Active Problem List   Diagnosis Date Noted  . Major depressive disorder, recurrent, unspecified ([place]) [F33.9] 03/15/2015   Total Time spent with patient: 25 minutes     Past Medical History: History reviewed. No pertinent past medical history. History reviewed. No pertinent past surgical history. Family History:  Family History  Problem Relation Age of Onset  . Depression Mother   . Depression Father   . Depression Brother      Social History:  History  Alcohol Use: Not on file     History  Drug Use Not on file    Social History   Social History  . Marital Status: Single    Spouse Name: N/A  . Number of Children: N/A  . Years of Education: N/A   Social History Main Topics  . Smoking status: Current Every Day Smoker -- 1.00 packs/day  . Smokeless tobacco: None  . Alcohol Use: None  . Drug Use: None  . Sexual Activity: Yes    Birth Control/ Protection: None   Other Topics Concern  . None   Social History Narrative  . None   Additional Social History:    Pain Medications: None, per pt Prescriptions: Prozac Over the Counter: None, per pt History of alcohol / drug use?: No history of alcohol / drug abuse Longest period of sobriety (when/how long): Pt drinking socially once every 3-4 months                    Sleep: Fair  Appetite:  Good  Current Medications: Current Facility-Administered Medications  Medication Dose Route Frequency Provider Last Rate Last Dose  . acetaminophen (TYLENOL) tablet 650 mg  650 mg Oral Q6H PRN Harriet Butte, NP      . alum & mag hydroxide-simeth (MAALOX/MYLANTA) 200-200-20 MG/5ML suspension 30 mL  30 mL Oral Q4H PRN Harriet Butte, NP      . ARIPiprazole (ABILIFY) tablet 2 mg  2 mg Oral QHS Ursula Alert, MD   2 mg at 03/15/15 2230  . magnesium hydroxide (MILK OF MAGNESIA) suspension  30 mL  30 mL Oral Daily PRN Harriet Butte, NP      . nicotine (NICODERM CQ - dosed in mg/24 hours) patch 21 mg  21 mg Transdermal Daily Encarnacion Slates, NP   21 mg at 03/15/15 1358  . [START ON 03/17/2015] sertraline (ZOLOFT) tablet 50 mg  50 mg Oral Daily Myer Peer Persais Ethridge, MD      . traZODone (DESYREL) tablet 50 mg  50 mg Oral QHS PRN Harriet Butte, NP   50 mg at 03/15/15 0100    Lab Results:  Results for orders placed or performed during the hospital encounter of 03/14/15 (from the past 48 hour(s))  CBC     Status: None   Collection Time: 03/15/15  6:41 AM   Result Value Ref Range   WBC 7.7 4.0 - 10.5 K/uL   RBC 3.99 3.87 - 5.11 MIL/uL   Hemoglobin 12.2 12.0 - 15.0 g/dL   HCT 36.2 36.0 - 46.0 %   MCV 90.7 78.0 - 100.0 fL   MCH 30.6 26.0 - 34.0 pg   MCHC 33.7 30.0 - 36.0 g/dL   RDW 12.7 11.5 - 15.5 %   Platelets 241 150 - 400 K/uL    Comment: Performed at [facility]  Comprehensive metabolic panel     Status: Abnormal   Collection Time: 03/15/15  6:41 AM  Result Value Ref Range   Sodium 143 135 - 145 mmol/L   Potassium 3.9 3.5 - 5.1 mmol/L   Chloride 107 101 - 111 mmol/L   CO2 27 22 - 32 mmol/L   Glucose, Bld 92 65 - 99 mg/dL   BUN 13 6 - 20 mg/dL   Creatinine, Ser 0.77 0.44 - 1.00 mg/dL   Calcium 8.9 8.9 - 10.3 mg/dL   Total Protein 6.4 (L) 6.5 - 8.1 g/dL   Albumin 3.8 3.5 - 5.0 g/dL   AST 17 15 - 41 U/L   ALT 13 (L) 14 - 54 U/L   Alkaline Phosphatase 59 38 - 126 U/L   Total Bilirubin 0.9 0.3 - 1.2 mg/dL   GFR calc non Af Amer >60 >60 mL/min   GFR calc Af Amer >60 >60 mL/min    Comment: (NOTE) The eGFR has been calculated using the CKD EPI equation. This calculation has not been validated in all clinical situations. eGFR's persistently <60 mL/min signify possible Chronic Kidney Disease.    Anion gap 9 5 - 15    Comment: Performed at [facility]  Lipid panel, fasting     Status: None   Collection Time: 03/15/15  6:41 AM  Result Value Ref Range   Cholesterol 126 0 - 200 mg/dL   Triglycerides 71 <150 mg/dL   HDL 46 >40 mg/dL   Total CHOL/HDL Ratio 2.7 RATIO   VLDL 14 0 - 40 mg/dL   LDL Cholesterol 66 0 - 99 mg/dL    Comment:        Total Cholesterol/HDL:CHD Risk Coronary Heart Disease Risk Table                     Men   Women  1/2 Average Risk   3.4   3.3  Average Risk       5.0   4.4  2 X Average Risk   9.6   7.1  3 X Average Risk  23.4   11.0        Use the calculated Patient Ratio above  and the CHD Risk Table to determine the patient's CHD Risk.        ATP III  CLASSIFICATION (LDL):  <100     mg/dL   Optimal  100-129  mg/dL   Near or Above                    Optimal  130-159  mg/dL   Borderline  160-189  mg/dL   High  >190     mg/dL   Very High Performed at [facility]   TSH     Status: None   Collection Time: 03/15/15  6:41 AM  Result Value Ref Range   TSH 1.411 0.350 - 4.500 uIU/mL    Comment: Performed at [facility]  Urine rapid drug screen (hosp performed)not at [facility]     Status: None   Collection Time: 03/15/15 12:48 PM  Result Value Ref Range   Opiates NONE DETECTED NONE DETECTED   Cocaine NONE DETECTED NONE DETECTED   Benzodiazepines NONE DETECTED NONE DETECTED   Amphetamines NONE DETECTED NONE DETECTED   Tetrahydrocannabinol NONE DETECTED NONE DETECTED   Barbiturates NONE DETECTED NONE DETECTED    Comment:        DRUG SCREEN FOR MEDICAL PURPOSES ONLY.  IF CONFIRMATION IS NEEDED FOR ANY PURPOSE, NOTIFY LAB WITHIN 5 DAYS.        LOWEST DETECTABLE LIMITS FOR URINE DRUG SCREEN Drug Class       Cutoff (ng/mL) Amphetamine      1000 Barbiturate      200 Benzodiazepine   127 Tricyclics       517 Opiates          300 Cocaine          300 THC              50 Performed at [facility]   Pregnancy, urine     Status: None   Collection Time: 03/15/15 12:48 PM  Result Value Ref Range   Preg Test, Ur NEGATIVE NEGATIVE    Comment:        THE SENSITIVITY OF THIS METHODOLOGY IS >20 mIU/mL. Performed at [facility]     Blood Alcohol level:  No results found for: [facility]  Physical Findings: AIMS: Facial and Oral Movements Muscles of Facial Expression: None, normal Lips and Perioral Area: None, normal Jaw: None, normal Tongue: None, normal,Extremity Movements Upper (arms, wrists, hands, fingers): None, normal Lower (legs, knees, ankles, toes): None, normal, Trunk Movements Neck, shoulders, hips: None, normal, Overall Severity Severity of abnormal movements (highest  score from questions above): None, normal Incapacitation due to abnormal movements: None, normal Patient's awareness of abnormal movements (rate only patient's report): No Awareness, Dental Status Current problems with teeth and/or dentures?: No Does patient usually wear dentures?: No  CIWA:    COWS:     Musculoskeletal: Strength & Muscle Tone: within normal limits Gait & Station: normal Patient leans: N/A  Psychiatric Specialty Exam: ROS dizziness this AM, no chest pain, no SOB. No rash   Blood pressure 96/46, pulse 62, temperature 98.8 F (37.1 C), temperature source Oral, resp. rate 16, height 5' 5"  (1.651 m), weight 146 lb (66.225 kg), SpO2 100 %.Body mass index is 24.3 kg/(m^2).  General Appearance: Fairly Groomed  Engineer, water::  Good  Speech:  Normal Rate  Volume:  Normal  Mood:  improved , currently minimizes depression  Affect:  Appropriate and somewhat anxious   Thought Process:  Linear  Orientation:  Full (Time, Place, and Person)  Thought Content:  denies hallucinations, no delusions  Suicidal Thoughts:  No- currently denies suicidal ideations, denies any self injurious ideations   Homicidal Thoughts:  No  Memory:  recent and remote grossly intact   Judgement:   Improving   Insight:  Improving   Psychomotor Activity:  Normal  Concentration:  Good  Recall:  Good  Fund of Knowledge:Good  Language: Good  Akathisia:  Negative  Handed:  Right  AIMS (if indicated):     Assets:  Communication Skills Desire for Improvement Physical Health Resilience  ADL's:  Intact  Cognition: WNL  Sleep:  Number of Hours: 3.75  Assessment - patient reports partially improved mood and at this time denies SI. Reports history of depression, and expresses concern that recent Prozac trial may have  Possibly contributed to suicidal ruminations . Reports brief episodes of increased energy and frequent short lived  mood swings , but no clear history of hypomania, mania  At this time on low  dose  Abilify/Zoloft - denies side effects.  Treatment Plan Summary: Daily contact with patient to assess and evaluate symptoms and progress in treatment, Medication management, Plan inpatient treatment  and medications as below  Encourage group participation and milieu participation to work on coping skills and symptom reduction Increase Zoloft to 50 mgrs QDAY for depression- side effects discussed  Continue Abilify 2 mgrs QDAY for mood disorder , augmentation - side effects discussed  Continue Trazodone 50 mgrs QHS for insomnia, as needed  Neita Garnet, MD 03/16/2015, 8:35 PM

## 2015-03-16 NOTE — Progress Notes (Signed)
[facility] Group Notes:  (Nursing/MHT/Case Management/Adjunct)  Date:  03/16/2015  Time:  2100  Type of Therapy:  wrap up group  Participation Level:  Active  Participation Quality:  Appropriate, Attentive, Sharing and Supportive  Affect:  Appropriate  Cognitive:  Appropriate  Insight:  Improving  Engagement in Group:  Engaged  Modes of Intervention:  Clarification, Education and Support  Summary of Progress/Problems: Pt reports having experienced depression since she was [age] years old. Pt returns to return home and back to school. Pt shares that she has a supportive boyfriend and many friends. Pt plans on continuing recovery through medication and seeing a psychiatric therapist. Pts affect is not congruent with reported mood. Pt is very social,expressive, talkative, pleasant, and reports being hopeful the depression will end.   Jennifer York 03/16/2015, 10:13 PM

## 2015-03-17 MED ORDER — ARIPIPRAZOLE 2 MG PO TABS: 2.0000 mg | ORAL_TABLET | Freq: Every day | ORAL | Status: DC

## 2015-03-17 MED ORDER — TRAZODONE HCL 50 MG PO TABS: 50.0000 mg | ORAL_TABLET | Freq: Every evening | ORAL | Status: DC | PRN

## 2015-03-17 MED ORDER — NICOTINE 21 MG/24HR TD PT24: 21.0000 mg | MEDICATED_PATCH | Freq: Every day | TRANSDERMAL | Status: DC

## 2015-03-17 MED ORDER — SERTRALINE HCL 50 MG PO TABS: 50.0000 mg | ORAL_TABLET | Freq: Every day | ORAL | Status: DC

## 2015-03-17 NOTE — Tx Team (Signed)
Interdisciplinary Treatment Plan Update (Adult) Date: 03/17/2015    Time Reviewed: 9:30 AM  Progress in Treatment: Attending groups: Yes Participating in groups: Yes Taking medication as prescribed: Yes Tolerating medication: Yes Family/Significant other contact made: Yes, with mother  Patient understands diagnosis: Yes Discussing patient identified problems/goals with staff: Yes Medical problems stabilized or resolved: Yes Denies suicidal/homicidal ideation: Yes Issues/concerns per patient self-inventory: Yes Other:  New problem(s) identified: N/A  Discharge Plan or Barriers: Pt will return home and follow-up with [facility]  Reason for Continuation of Hospitalization:  Depression Anxiety Medication Stabilization   Comments: N/A  Estimated length of stay: 0 days    Patient is a 25 year old female with a diagnosis of Major Depressive Disorder. Pt presented to the hospital with suicidal ideations. No primary trigger(s) for admission was identified. Patient will benefit from crisis stabilization, medication evaluation, group therapy and psycho education in addition to case management for discharge planning. At discharge, it is recommended that Pt remain compliant with established discharge plan and continued treatment.   Review of initial/current patient goals per problem list:  1. Goal(s): Patient will participate in aftercare plan   Met: Yes   Target date: 3-5 days post admission date   As evidenced by: Patient will participate within aftercare plan AEB aftercare provider and housing plan at discharge being identified.   2/21: Goal met. Patient will return home to follow up with outpatient services.    2. Goal (s): Patient will exhibit decreased depressive symptoms and suicidal ideations.   Met: Yes   Target date: 3-5 days post admission date   As evidenced by: Patient will utilize self rating of depression at 3 or below and demonstrate  decreased signs of depression or be deemed stable for discharge by MD.   2/21: Goal not met: Pt presents with flat affect and depressed mood.  Pt admitted with depression rating of 10.  Pt to show decreased sign of depression and a rating of 3 or less before d/c.    2/22: Pt rates depression at 2/10; denies SI    Attendees: Patient:    Family:    Physician: Dr. Parke Poisson 03/16/2015 9:30 AM  Nursing: Darrol Angel, RN; Hughes Better 03/16/2015 9:30 AM  Clinical Social Worker: Erasmo Downer Drinkard, LCSW 03/16/2015 9:30 AM  Other: Peri Maris, LCSWA; [place], LCSW  03/16/2015 9:30 AM  Other:  03/16/2015 9:30 AM  Other: Lars Pinks, Case Manager 03/16/2015 9:30 AM  Other: Agustina Caroli, NP 03/16/2015 9:30 AM  Other:    Other:      Scribe for Treatment Team:  Peri Maris, [place] [facility] [phone]

## 2015-03-17 NOTE — Discharge Summary (Signed)
Physician Discharge Summary Note  Patient:  Jennifer York is an 25 y.o., female MRN:  [id number] DOB:  Oct 08, 1990 Patient phone:  [phone] (home)  Patient address:   [address] [place] [address],  Total Time spent with patient: 45 minutes  Date of Admission:  03/14/2015 Date of Discharge: 03/17/2015   Reason for Admission:  worsening depression, suicidal ideation  Principal Problem: Major depressive disorder, recurrent, unspecified (HCC) Discharge Diagnoses: Patient Active Problem List   Diagnosis Date Noted  . Major depressive disorder, recurrent, unspecified (HCC) [F33.9] 03/15/2015    Past Psychiatric History:  See above noted  Past Medical History: History reviewed. No pertinent past medical history. History reviewed. No pertinent past surgical history. Family History:  Family History  Problem Relation Age of Onset  . Depression Mother   . Depression Father   . Depression Brother    Family Psychiatric  History:  denied Social History:  History  Alcohol Use: Not on file     History  Drug Use Not on file    Social History   Social History  . Marital Status: Single    Spouse Name: N/A  . Number of Children: N/A  . Years of Education: N/A   Social History Main Topics  . Smoking status: Current Every Day Smoker -- 1.00 packs/day  . Smokeless tobacco: None  . Alcohol Use: None  . Drug Use: None  . Sexual Activity: Yes    Birth Control/ Protection: None   Other Topics Concern  . None   Social History Narrative  . 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 She c/o worsening depression with SI (no plan or intent), stated that she found herself searching for a gun in her friend's home earlier today.  Jennifer Krahn was admitted for Major depressive disorder, recurrent, unspecified (HCC) and crisis  management.  She was treated with the following medications listed below.  Jennifer Gillson was discharged with current medication and was instructed on how to take medications as prescribed; (details listed below under Medication List).  Medical problems were identified and treated as needed.  Home medications were restarted as appropriate.  Improvement was monitored by observation and Jennifer Knowlton daily report of symptom reduction.  Emotional and mental status was monitored by daily self-inventory reports completed by Jennifer Erbe and clinical staff.         Jennifer Stayer was evaluated by the treatment team for stability and plans for continued recovery upon discharge.  Jennifer Baillie motivation was an integral factor for scheduling further treatment.  Employment, transportation, bed availability, health status, family support, and any pending legal issues were also considered during her hospital stay.  She was offered further treatment options upon discharge including but not limited to Residential, Intensive Outpatient, and Outpatient treatment.  Jennifer Deatley will follow up with the services as listed below under Follow Up Information.     Upon completion of this admission the Jennifer Bishop was both mentally and medically stable for discharge denying suicidal/homicidal ideation, auditory/visual/tactile hallucinations, delusional thoughts and paranoia.     Physical Findings: AIMS: Facial and Oral Movements Muscles of Facial Expression: None, normal Lips and Perioral Area: None, normal Jaw: None, normal Tongue: None, normal,Extremity Movements Upper (arms, wrists, hands, fingers): None, normal Lower (legs, knees, ankles, toes): None, normal, Trunk Movements Neck, shoulders, hips: None, normal, Overall  Severity Severity of abnormal movements (highest score from questions above): None, normal Incapacitation due to abnormal movements: None, normal Patient's awareness of abnormal movements (rate only  patient's report): No Awareness, Dental Status Current problems with teeth and/or dentures?: No Does patient usually wear dentures?: No  CIWA:    COWS:     Musculoskeletal: Strength & Muscle Tone: within normal limits Gait & Station: normal Patient leans: N/A  Psychiatric Specialty Exam:  See MD SRA Review of Systems  All other systems reviewed and are negative.   Blood pressure 100/55, pulse 59, temperature 98.5 F (36.9 C), temperature source Oral, resp. rate 16, height  (1.651 m), weight 66.225 kg (146 lb), SpO2 98 %.Body mass index is 24.3 kg/(m^2).  Have you used any form of tobacco in the last 30 days? (Cigarettes, Smokeless Tobacco, Cigars, and/or Pipes): Yes  Has this patient used any form of tobacco in the last 30 days? (Cigarettes, Smokeless Tobacco, Cigars, and/or Pipes) Yes, Rx given  Blood Alcohol level:  No results found for: [facility]  Metabolic Disorder Labs:  No results found for: HGBA1C, MPG No results found for: PROLACTIN Lab Results  Component Value Date   CHOL 126 03/15/2015   TRIG 71 03/15/2015   HDL 46 03/15/2015   CHOLHDL 2.7 03/15/2015   VLDL 14 03/15/2015   LDLCALC 66 03/15/2015    See Psychiatric Specialty Exam and Suicide Risk Assessment completed by Attending Physician prior to discharge.  Discharge destination:  Home  Is patient on multiple antipsychotic therapies at discharge:  No   Has Patient had three or more failed trials of antipsychotic monotherapy by history:  No  Recommended Plan for Multiple Antipsychotic Therapies: NA     Medication List    STOP taking these medications        FLUoxetine 20 MG capsule  Commonly known as:  PROZAC      TAKE these medications      Indication   ARIPiprazole 2 MG tablet  Commonly known as:  ABILIFY  Take 1 tablet (2 mg total) by mouth at bedtime.   Indication:  mood stabilization     nicotine 21 mg/24hr patch  Commonly known as:  NICODERM CQ - dosed in mg/24 hours  Place 1 patch (21  mg total) onto the skin daily.   Indication:  Nicotine Addiction     sertraline 50 MG tablet  Commonly known as:  ZOLOFT  Take 1 tablet (50 mg total) by mouth daily.   Indication:  Major Depressive Disorder     traZODone 50 MG tablet  Commonly known as:  DESYREL  Take 1 tablet (50 mg total) by mouth at bedtime as needed for sleep (May repeat x1).   Indication:  Trouble Sleeping       Follow-up Information    Follow up with [facility].   Why:  Medication management appointment on Friday February 24th at 3pm. Therapy appt on Tuesday February 28th at 2pm. Crisis management appt with Genia Del at [facility] Office on Weds March 1st at 2pm. Call office if you need to reschedule.   Contact information:   [address] [place], [place] [address] [phone]      Follow-up recommendations:  Activity:  as tol Diet:  as tol  Comments:  1.  Take all your medications as prescribed.              2.  Report any adverse side effects to outpatient provider.  3.  Patient instructed to not use alcohol or illegal drugs while on prescription medicines.            4.  In the event of worsening symptoms, instructed patient to call 911, the crisis hotline or go to nearest emergency room for evaluation of symptoms.  Signed: Lindwood Qua, NP [facility] 03/17/2015, 2:45 PM   Patient seen, Suicide Assessment Completed.  Disposition Plan Reviewed

## 2015-03-17 NOTE — [facility] Suicide Risk Assessment (Addendum)
[facility] Discharge Suicide Risk Assessment   Principal Problem: Major depressive disorder, recurrent, unspecified (HCC) Discharge Diagnoses:  Patient Active Problem List   Diagnosis Date Noted  . Major depressive disorder, recurrent, unspecified (HCC) [F33.9] 03/15/2015    Total Time spent with patient: 30 minutes  Musculoskeletal: Strength & Muscle Tone: within normal limits Gait & Station: normal Patient leans: N/A  Psychiatric Specialty Exam: ROS no current dizziness or lightheadedness, no nausea, no vomiting   Blood pressure 97/47, pulse 56, temperature 98.5 F (36.9 C), temperature source Oral, resp. rate 16, height  (1.651 m), weight 146 lb (66.225 kg), SpO2 98 %.Body mass index is 24.3 kg/(m^2).  General Appearance: Well Groomed  Eye Contact::  Good  Speech:  Normal Rate409  Volume:  Normal  Mood:  Euthymic- at  This time denies depression, reports mood as improved   Affect:  Appropriate- fuller  range of affect   Thought Process:  Linear  Orientation:  Full (Time, Place, and Person)  Thought Content:  no hallucinations, no delusions,not internally preoccupied   Suicidal Thoughts:  denies any suicidal ideations - denies any self injurious ideations   Homicidal Thoughts:  No  Memory:  recent and remote grossly intact   Judgement:  Other:  improved  Insight:  Present  Psychomotor Activity:  Normal  Concentration:  Good  Recall:  Good  Fund of Knowledge:Good  Language: Good  Akathisia:  Negative  Handed:  Right  AIMS (if indicated):     Assets:  Communication Skills Desire for Improvement Resilience  Sleep:  Number of Hours: 4.75  Cognition: WNL  ADL's:  Intact   Mental Status Per Nursing Assessment::   On Admission:  Self-harm thoughts, Suicidal ideation indicated by patient, Intention to act on suicide plan  Demographic Factors:  25 year old single female, no children, college student   Loss Factors: Reports Prozac trial was no longer helping    Historical Factors: History of depression,  Reports history of short lived episodes of increased energy- short duration ( minutes- hours ) , no history of suicide attempts, history of self cutting  Risk Reduction Factors:   Sense of responsibility to family, Living with another person, especially a relative, Positive social support and Positive coping skills or problem solving skills  Continued Clinical Symptoms:  At this time patient reports improvement- presents euthymic, full range of affect, no thought disorder, no SI , no HI, no psychotic symptoms, future oriented , denies any current medication side effects * we reviewed medication side effects.   Cognitive Features That Contribute To Risk:  No gross cognitive deficits noted upon discharge. Is alert , attentive, and oriented x 3     Suicide Risk:  Mild:  Suicidal ideation of limited frequency, intensity, duration, and specificity.  There are no identifiable plans, no associated intent, mild dysphoria and related symptoms, good self-control (both objective and subjective assessment), few other risk factors, and identifiable protective factors, including available and accessible social support.  Follow-up Information    Follow up with [facility]   Why:  Medication management appointment on Friday February 24th at 3pm. Therapy appt on Tuesday February 28th at 2pm. Crisis management appt with Genia Del at [facility] Office on Weds March 1st at 2pm. Call office if you need to reschedule.   Contact information:   [address] [place], [place] [address] [phone]      Plan Of Care/Follow-up recommendations:  Activity:  as tolerated  Diet:  Regular Tests:  NA Other:  see below   Patient is requesting discharge today- no current grounds for involuntary commitment  Leaving unit  in good spirits Plans to go back home  Follow up as above- has established therapist and psychiatrist at [facility]     Nehemiah Massed, MD 03/17/2015, 12:06 PM

## 2015-03-17 NOTE — [facility] Suicide Risk Assessment (Signed)
[facility] INPATIENT:  Family/Significant Other Suicide Prevention Education  Suicide Prevention Education:  Education Completed; Suella Grove, Pt's mother [phone],  has been identified by the patient as the family member/significant other with whom the patient will be residing, and identified as the person(s) who will aid the patient in the event of a mental health crisis (suicidal ideations/suicide attempt).  With written consent from the patient, the family member/significant other has been provided the following suicide prevention education, prior to the and/or following the discharge of the patient.  The suicide prevention education provided includes the following:  Suicide risk factors  Suicide prevention and interventions  National Suicide Hotline telephone number  [facility] assessment telephone number  [facility] Emergency Assistance 911  [facility] and/or Residential Mobile Crisis Unit telephone number  Request made of family/significant other to:  Remove weapons (e.g., guns, rifles, knives), all items previously/currently identified as safety concern.    Remove drugs/medications (over-the-counter, prescriptions, illicit drugs), all items previously/currently identified as a safety concern.  The family member/significant other verbalizes understanding of the suicide prevention education information provided.  The family member/significant other agrees to remove the items of safety concern listed above.  Elaina Hoops 03/17/2015, 4:15 PM

## 2015-03-17 NOTE — Progress Notes (Signed)
Recreation Therapy Notes  Date: 02.22.2017 Time: 9:30am Location: 300 [address] Group Room   Group Topic: Stress Management  Goal Area(s) Addresses:  Patient will actively participate in stress management techniques presented during session.   Behavioral Response: Did not attend.   Odalys Win L Keiasha Diep, LRT/CTRS        Katrinka Herbison L 03/17/2015 10:21 AM 

## 2015-03-17 NOTE — Progress Notes (Signed)
D: Patient alert and oriented x 4. Patient denies pain/SI/HI/AVH. Patient reports, "I feel a lot better. My medications and being switched, and I think that is all that I needed. I just couldn't wait three weeks to see my regular doctor at [facility]. But by me staying here I feel like my depression is just chemical imbalance and not mental because I have never had the things happen to me like they have to other people."  This writer advised patient to focus on herself.  A: Staff to monitor Q 15 mins for safety. Encouragement and support offered. Scheduled medications administered per orders. R: Patient remains safe on the unit. Patient attended group tonight. Patient visible on the unit and interacting with peers. Patient taking administered medications.

## 2015-03-17 NOTE — Progress Notes (Signed)
  [facility] Adult Case Management Discharge Plan :  Will you be returning to the same living situation after discharge:  Yes,  Pt returning home At discharge, do you have transportation home?: Yes,  Pt friend to pick up Do you have the ability to pay for your medications: Yes,  Pt provided with prescriptions  Release of information consent forms completed and in the chart;  Patient's signature needed at discharge.  Patient to Follow up at: Follow-up Information    Follow up with [facility].   Why:  Medication management appointment on Friday February 24th at 3pm. Therapy appt on Tuesday February 28th at 2pm. Crisis management appt with Genia Del at [facility] Office on Weds March 1st at 2pm. Call office if you need to reschedule.   Contact information:   [address] [place], [place] [address] [phone]      Next level of care provider has access to [facility] Link:no  Safety Planning and Suicide Prevention discussed: Yes,  with mother; see SPE note for further details  Have you used any form of tobacco in the last 30 days? (Cigarettes, Smokeless Tobacco, Cigars, and/or Pipes): Yes  Has patient been referred to the Quitline?: Patient refused referral  Patient has been referred for addiction treatment: N/A  Elaina Hoops 03/17/2015, 3:58 PM

## 2015-03-17 NOTE — Progress Notes (Signed)
Discharge note: Pt received both written and verbal discharge instructions. Pt verbalized understanding of discharge instructions. Pt agreed to f/u appt and med regimen. Pt received AVS, SRA, Trans report and prescriptions. Pt received belongings from room and locker.

## 2019-12-21 ENCOUNTER — Emergency Department (HOSPITAL_COMMUNITY)
Admission: EM | Admit: 2019-12-21 | Discharge: 2019-12-22 | Disposition: A | Discharge status: Home / Self Care | Payer: Medicaid - Out of State | Attending: Emergency Medicine | Admitting: Emergency Medicine

## 2019-12-21 ENCOUNTER — Encounter (HOSPITAL_COMMUNITY): Payer: Self-pay | Admitting: Emergency Medicine

## 2019-12-21 ENCOUNTER — Emergency Department (HOSPITAL_COMMUNITY): Payer: Medicaid - Out of State

## 2019-12-21 DIAGNOSIS — O034 Incomplete spontaneous abortion without complication: Secondary | ICD-10-CM | POA: Diagnosis present

## 2019-12-21 DIAGNOSIS — N939 Abnormal uterine and vaginal bleeding, unspecified: Secondary | ICD-10-CM | POA: Diagnosis not present

## 2019-12-21 DIAGNOSIS — Z20822 Contact with and (suspected) exposure to covid-19: Secondary | ICD-10-CM | POA: Insufficient documentation

## 2019-12-21 LAB — TYPE AND SCREEN
ABO/RH(D): O POS
Antibody Screen: NEGATIVE

## 2019-12-21 LAB — BASIC METABOLIC PANEL
Anion gap: 8 (ref 5–15)
BUN: 8 mg/dL (ref 6–20)
CO2: 25 mmol/L (ref 22–32)
Calcium: 8.6 mg/dL — ABNORMAL LOW (ref 8.9–10.3)
Chloride: 106 mmol/L (ref 98–111)
Creatinine, Ser: 0.6 mg/dL (ref 0.44–1.00)
GFR, Estimated: >60 mL/min (ref >60)
Glucose, Bld: 109 mg/dL — ABNORMAL HIGH (ref 70–99)
Potassium: 4 mmol/L (ref 3.5–5.1)
Sodium: 139 mmol/L (ref 135–145)

## 2019-12-21 LAB — CBC
HCT: 36 % (ref 36.0–46.0)
Hemoglobin: 11.8 g/dL — ABNORMAL LOW (ref 12.0–15.0)
MCH: 30.4 pg (ref 26.0–34.0)
MCHC: 32.8 g/dL (ref 30.0–36.0)
MCV: 92.8 fL (ref 80.0–100.0)
Platelets: 230 10*3/uL (ref 150–400)
RBC: 3.88 MIL/uL (ref 3.87–5.11)
RDW: 12.1 % (ref 11.5–15.5)
WBC: 8.2 10*3/uL (ref 4.0–10.5)
nRBC: 0 % (ref 0.0–0.2)

## 2019-12-21 LAB — I-STAT BETA HCG BLOOD, ED (MC, WL, AP ONLY): I-stat hCG, quantitative: 967.6 m[IU]/mL — ABNORMAL HIGH (ref <5)

## 2019-12-21 NOTE — ED Triage Notes (Signed)
Pt c/o heavy vaginal bleeding x 1 hour, pt reports using 10+ pads and tampons in that time. Pt had TAB about 4 weeks ago with minor bleeding on/off since that time but heavy, watery with some clotting x 1 hour.

## 2019-12-21 NOTE — ED Triage Notes (Signed)
Emergency Medicine Provider OB Triage Evaluation Note  Jennifer York is a 29 y.o. female, No obstetric history on file., at Unknown gestation who presents to the emergency department with complaints of heavy vaginal bleeding.  Patient reportedly had a medical abortion approximately 4 weeks ago and since that has been going through approximately 5 pads per day with mild intermittent bleeding.  However, in the past couple of days she has been experiencing increased vaginal bleeding.  She states in a period of 1 hours, she went through several pads.  She is hemodynamically stable, but complaining of lightheadedness.  No chest pain, abdominal pain, nausea or vomiting, or other symptoms.  Review of  Systems  Positive: Lightheadedness Negative: Chest pain, abdominal or pelvic pain.  Physical Exam  BP 127/63   Pulse 98   Temp 98.5 F (36.9 C)   Resp 18   Ht 5\' 7"  (1.702 m)   Wt 72.6 kg   LMP 10/05/2019   SpO2 99%   BMI 25.06 kg/m  General: Awake, no distress  HEENT: Atraumatic  Resp: Normal effort  Cardiac: Normal rate Abd: Nondistended, nontender  MSK: Moves all extremities without difficulty Neuro: Speech clear  Medical Decision Making  Pt evaluated for pregnancy concern and is stable for transfer to [facility]. Pt is in agreement with plan for transfer.  8:07 PM Discussed with [facility], Dr. 12/05/2019, who states that they are no longer accepting GYN patients.    Clinical Impression  No diagnosis found.     [name], PA-C 12/21/19 2011

## 2019-12-21 NOTE — ED Provider Notes (Signed)
[facility] EMERGENCY DEPARTMENT Provider Note   CSN: [id number] Arrival date & time: 12/21/19  1914     History Chief Complaint  Patient presents with  . Vaginal Bleeding    Jennifer York is a 29 y.o. female.  29 y/o G1P0 female presents to the emergency department for evaluation of vaginal bleeding.  She reports a medical abortion approximately 4 weeks ago; took 2 tablets of methotrexate.  Had follow-up with OB/GYN ([facility]) on Monday with an ultrasound which did not show any retained products of conception.  Has been taking OCPs x 2-3 weeks to help with bleeding control.  States that her vaginal bleeding has been pretty mild from the time of her abortion, intermittent.  Was using approximately 5 pads per day.  Approximately 4 hours ago, bleeding became much heavier.  She required the use of 3 pads every 15 minutes.  She is feeling slightly lightheaded, but has not experienced any syncope.  Denies chest pain, abdominal or pelvic pain, nausea, vomiting, fevers.  The history is provided by the patient. No language interpreter was used.  Vaginal Bleeding      History reviewed. No pertinent past medical history.  Patient Active Problem List   Diagnosis Date Noted  . Major depressive disorder, recurrent, unspecified (HCC) 03/15/2015    History reviewed. No pertinent surgical history.   OB History   No obstetric history on file.     Family History  Problem Relation Age of Onset  . Depression Mother   . Depression Father   . Depression Brother     Social History   Tobacco Use  . Smoking status: Never Smoker  . Smokeless tobacco: Never Used  Substance Use Topics  . Alcohol use: Yes  . Drug use: Not Currently    Home Medications Prior to Admission medications   Medication Sig Start Date End Date Taking? Authorizing Provider  ARIPiprazole (ABILIFY) 2 MG tablet Take 1 tablet (2 mg total) by mouth at bedtime. 03/17/15   Adonis Brook, NP    nicotine (NICODERM CQ - DOSED IN MG/24 HOURS) 21 mg/24hr patch Place 1 patch (21 mg total) onto the skin daily. 03/17/15   Adonis Brook, NP  sertraline (ZOLOFT) 50 MG tablet Take 1 tablet (50 mg total) by mouth daily. 03/17/15   Adonis Brook, NP  traZODone (DESYREL) 50 MG tablet Take 1 tablet (50 mg total) by mouth at bedtime as needed for sleep (May repeat x1). 03/17/15   Adonis Brook, NP    Allergies    Patient has no known allergies.  Review of Systems   Review of Systems  Genitourinary: Positive for vaginal bleeding.  Ten systems reviewed and are negative for acute change, except as noted in the HPI.    Physical Exam Updated Vital Signs BP (!) 117/46   Pulse 87   Temp 98.5 F (36.9 C)   Resp 16   Ht 5\' 7"  (1.702 m)   Wt 72.6 kg   LMP 10/05/2019   SpO2 99%   BMI 25.06 kg/m   Physical Exam Vitals and nursing note reviewed.  Constitutional:      General: She is not in acute distress.    Appearance: She is well-developed. She is not diaphoretic.     Comments: Nontoxic appearing and in NAD  HENT:     Head: Normocephalic and atraumatic.  Eyes:     General: No scleral icterus.    Conjunctiva/sclera: Conjunctivae normal.  Cardiovascular:     Rate  and Rhythm: Normal rate and regular rhythm.     Pulses: Normal pulses.  Pulmonary:     Effort: Pulmonary effort is normal. No respiratory distress.     Comments: Respirations even and unlabored Abdominal:     Palpations: Abdomen is soft.     Comments: Soft, nondistended, nontender.  Musculoskeletal:        General: Normal range of motion.     Cervical back: Normal range of motion.  Skin:    General: Skin is warm and dry.     Coloration: Skin is not pale.     Findings: No erythema or rash.  Neurological:     Mental Status: She is alert and oriented to person, place, and time.  Psychiatric:        Behavior: Behavior normal.     ED Results / Procedures / Treatments   Labs (all labs ordered are listed, but only  abnormal results are displayed) Labs Reviewed  CBC - Abnormal; Notable for the following components:      Result Value   Hemoglobin 11.8 (*)    All other components within normal limits  BASIC METABOLIC PANEL - Abnormal; Notable for the following components:   Glucose, Bld 109 (*)    Calcium 8.6 (*)    All other components within normal limits  I-STAT BETA HCG BLOOD, ED ([facility], [facility], [facility] ONLY) - Abnormal; Notable for the following components:   I-stat hCG, quantitative 967.6 (*)    All other components within normal limits  TYPE AND SCREEN  ABO/RH  GC/CHLAMYDIA PROBE AMP ([place]) NOT AT [facility]    EKG None  Radiology US OB LESS THAN 14 WEEKS W/ OB TRANSVAGINAL AND DOPPLER  Result Date: 12/22/2019 CLINICAL DATA:  Vaginal bleeding for several weeks following recent medical abortion EXAM: OBSTETRIC <14 WK [place] AND TRANSVAGINAL OB US DOPPLER ULTRASOUND OF OVARIES TECHNIQUE: Both transabdominal and transvaginal ultrasound examinations were performed for complete evaluation of the gestation as well as the maternal uterus, adnexal regions, and pelvic cul-de-sac. Transvaginal technique was performed to assess early pregnancy. Color and duplex Doppler ultrasound was utilized to evaluate blood flow to the ovaries. COMPARISON:  None. FINDINGS: Intrauterine gestational sac: Absent Maternal uterus/adnexae: Heterogeneous collection is noted within the endometrial canal measuring 5.5 x 1.2 x 2.4 cm. This demonstrates significant increased vascularity consistent with retained products of conception. Right ovary is within normal limits. Complex area is noted within the left ovary measuring 1.4 cm with mild increased vascularity. Pulsed Doppler evaluation of both ovaries demonstrates normal appearing low-resistance arterial and venous waveforms. IMPRESSION: Changes consistent with retained products of conception within the endometrial canal. Complex area within the left ovary with mild increased vascularity of  uncertain significance. This may simply represent a functional cyst. Electronically Signed   By: Alcide Clever M.D.   On: 12/22/2019 01:20    Procedures Procedures (including critical care time)  Medications Ordered in ED Medications - No data to display  ED Course  I have reviewed the triage vital signs and the nursing notes.  Pertinent labs & imaging results that were available during my care of the patient were reviewed by me and considered in my medical decision making (see chart for details).  Clinical Course as of Dec 21 233  Mon Dec 22, 2019  0212 Spoke with Dr. [name] of OBGYN who will schedule patient for outpatient [facility] for retained POC. Advises patient be given appropriate return precautions. Patient updated and agreeable to plan.   [KH]  Clinical Course User Index [KH] Antony Madura, PA-C   MDM Rules/Calculators/A&P                          29 year old female with history of medical abortion 4 weeks ago presents to the emergency department for heavy vaginal bleeding which began around 1900.  This has spontaneously improved without intervention.  She has been in the emergency department for approximately 7 hours without clinical decompensation.  Vitals have remained stable without tachycardia, hypotension.  Her hemoglobin is reassuring at 11.8.  Evaluation concerning for retained products of conception given hCG of 967 and heterogeneous collection within the endometrial canal on ultrasound.  These findings have been discussed with Dr. [name] of faculty practice.  Given that the patient's clinical course has improved, plan for outpatient D&C for management.  Patient advised to continue her oral contraceptive pills.  Discussed strict return precautions.  Patient verbalizes understanding.  Discharged in stable condition with no unaddressed concerns.   Final Clinical Impression(s) / ED Diagnoses Final diagnoses:  Retained products of conception following abortion    Rx / DC  Orders ED Discharge Orders    None       Antony Madura, PA-C 12/22/19 0240    Dione Booze, MD 12/22/19 [phone]

## 2019-12-22 ENCOUNTER — Other Ambulatory Visit (HOSPITAL_COMMUNITY)
Admission: RE | Admit: 2019-12-22 | Discharge: 2019-12-22 | Disposition: A | Discharge status: Home / Self Care | Payer: Medicaid - Out of State | Source: Ambulatory Visit | Attending: Family Medicine | Admitting: Family Medicine

## 2019-12-22 ENCOUNTER — Other Ambulatory Visit: Payer: Self-pay

## 2019-12-22 ENCOUNTER — Encounter (HOSPITAL_BASED_OUTPATIENT_CLINIC_OR_DEPARTMENT_OTHER): Payer: Self-pay | Admitting: Family Medicine

## 2019-12-22 ENCOUNTER — Telehealth: Payer: Self-pay | Admitting: Family Medicine

## 2019-12-22 NOTE — Discharge Instructions (Signed)
You were found to have retained products of conception in your uterus which can account for your heavy bleeding. You will be scheduled for a D&C by [facility]; call the office by Monday evening if you do not hear back about scheduling of this procedure. Continue your oral contraceptives. Return to Maternal Admissions Unit at [facility] if you experience worsening pain, fever over 100.34F, or recurrent heavy bleeding (more than 1 pad per hour for 3+ consecutive hours).

## 2019-12-22 NOTE — ED Notes (Signed)
E-signature pad unavailable at time of pt discharge. This RN discussed discharge materials with pt and answered all pt questions. Pt stated understanding of discharge material. ? ?

## 2019-12-22 NOTE — Telephone Encounter (Signed)
Telephone call to patient regarding surgery instructions.  Patient has been scheduled for 12/24/19 at 2:45pm with Dr. Tinnie Gens.  This is for D&E for retained products of conception.  Procedure is scheduled for [facility].    Left message for patient to call for full instructions.

## 2019-12-23 ENCOUNTER — Other Ambulatory Visit (HOSPITAL_COMMUNITY): Payer: Medicaid - Out of State

## 2019-12-23 DIAGNOSIS — O034 Incomplete spontaneous abortion without complication: Secondary | ICD-10-CM | POA: Diagnosis present

## 2019-12-23 LAB — GC/CHLAMYDIA PROBE AMP (~~LOC~~) NOT AT ARMC
Chlamydia: NEGATIVE
Comment: NEGATIVE
Comment: NORMAL
Neisseria Gonorrhea: NEGATIVE

## 2019-12-23 LAB — SARS CORONAVIRUS 2 (TAT 6-24 HRS): SARS Coronavirus 2: NEGATIVE

## 2019-12-23 NOTE — H&P (Signed)
Jennifer York is an 29 y.o. No obstetric history on file. female.   Chief Complaint: bleeding HPI: h/o TAB 4.5 weeks ago. Continued spotting on OCs. Heavier bleeding in last 3 days. U/S reveals retained POC > 5 cm.  Past Medical History:  Diagnosis Date  . Medical history non-contributory     History reviewed. No pertinent surgical history.  Family History  Problem Relation Age of Onset  . Depression Mother   . Depression Father   . Depression Brother    Social History:  reports that she has never smoked. She has never used smokeless tobacco. She reports current alcohol use. She reports previous drug use.  Allergies: No Known Allergies  No medications prior to admission.    A comprehensive review of systems was negative.  Height 5\' 7"  (1.702 m), weight 72.6 kg. General appearance: alert, cooperative and appears stated age Head: Normocephalic, without obvious abnormality, atraumatic Neck: supple, symmetrical, trachea midline Lungs: normal effort Heart: regular rate and rhythm Abdomen: soft, non-tender; bowel sounds normal; no masses,  no organomegaly Extremities: extremities normal, atraumatic, no cyanosis or edema Skin: Skin color, texture, turgor normal. No rashes or lesions Neurologic: Grossly normal   Lab Results  Component Value Date   WBC 8.2 12/21/2019   HGB 11.8 (L) 12/21/2019   HCT 36.0 12/21/2019   MCV 92.8 12/21/2019   PLT 230 12/21/2019   Lab Results  Component Value Date   PREGTESTUR NEGATIVE 03/15/2015   HCG 967.6 (H) 12/21/2019     Assessment/Plan Principal Problem:   Retained products of conception following abortion  For Dilation and Curettage with suction. Risks include but are not limited to bleeding, infection, injury to surrounding structures, including bowel, bladder and ureters, blood clots, and death.  Likelihood of success is high.    12/23/2019 12/23/2019, 11:26 AM

## 2019-12-24 ENCOUNTER — Ambulatory Visit (HOSPITAL_BASED_OUTPATIENT_CLINIC_OR_DEPARTMENT_OTHER)
Admission: RE | Admit: 2019-12-24 | Discharge: 2019-12-24 | Disposition: A | Discharge status: Home / Self Care | Payer: Medicaid - Out of State | Attending: Family Medicine | Admitting: Family Medicine

## 2019-12-24 ENCOUNTER — Encounter (HOSPITAL_BASED_OUTPATIENT_CLINIC_OR_DEPARTMENT_OTHER)
Admission: RE | Disposition: A | Discharge status: Home / Self Care | Payer: Self-pay | Source: Home / Self Care | Attending: Family Medicine

## 2019-12-24 ENCOUNTER — Ambulatory Visit (HOSPITAL_BASED_OUTPATIENT_CLINIC_OR_DEPARTMENT_OTHER): Payer: Medicaid - Out of State | Admitting: Anesthesiology

## 2019-12-24 ENCOUNTER — Other Ambulatory Visit: Payer: Self-pay

## 2019-12-24 ENCOUNTER — Encounter (HOSPITAL_BASED_OUTPATIENT_CLINIC_OR_DEPARTMENT_OTHER): Payer: Self-pay | Admitting: Family Medicine

## 2019-12-24 DIAGNOSIS — O034 Incomplete spontaneous abortion without complication: Secondary | ICD-10-CM | POA: Diagnosis present

## 2019-12-24 DIAGNOSIS — Z818 Family history of other mental and behavioral disorders: Secondary | ICD-10-CM | POA: Insufficient documentation

## 2019-12-24 DIAGNOSIS — O046 Delayed or excessive hemorrhage following (induced) termination of pregnancy: Secondary | ICD-10-CM | POA: Insufficient documentation

## 2019-12-24 HISTORY — DX: Other specified health status: Z78.9

## 2019-12-24 HISTORY — DX: Family history of other specified conditions: Z84.89

## 2019-12-24 HISTORY — PX: DILATION AND EVACUATION: SHX1459

## 2019-12-24 LAB — CBC
HCT: 32.1 % — ABNORMAL LOW (ref 36.0–46.0)
Hemoglobin: 11.2 g/dL — ABNORMAL LOW (ref 12.0–15.0)
MCH: 31.7 pg (ref 26.0–34.0)
MCHC: 34.9 g/dL (ref 30.0–36.0)
MCV: 90.9 fL (ref 80.0–100.0)
Platelets: 217 10*3/uL (ref 150–400)
RBC: 3.53 MIL/uL — ABNORMAL LOW (ref 3.87–5.11)
RDW: 12.1 % (ref 11.5–15.5)
WBC: 6.9 10*3/uL (ref 4.0–10.5)
nRBC: 0 % (ref 0.0–0.2)

## 2019-12-24 SURGERY — DILATION AND EVACUATION, UTERUS
Anesthesia: General | Site: Vagina

## 2019-12-24 MED ORDER — PROPOFOL 10 MG/ML IV BOLUS: INTRAVENOUS | Status: DC | PRN

## 2019-12-24 MED ORDER — CARBOPROST TROMETHAMINE 250 MCG/ML IM SOLN
INTRAMUSCULAR | Status: AC
  Filled 2019-12-24: qty 1

## 2019-12-24 MED ORDER — LIDOCAINE 2% (20 MG/ML) 5 ML SYRINGE
INTRAMUSCULAR | Status: DC | PRN
  Administered 2019-12-24: 60 mg via INTRAVENOUS

## 2019-12-24 MED ORDER — METHYLERGONOVINE MALEATE 0.2 MG/ML IJ SOLN
INTRAMUSCULAR | Status: AC
  Filled 2019-12-24: qty 1

## 2019-12-24 MED ORDER — SODIUM CHLORIDE 0.9 % IV SOLN
INTRAVENOUS | Status: AC
  Filled 2019-12-24: qty 100

## 2019-12-24 MED ORDER — FENTANYL CITRATE (PF) 100 MCG/2ML IJ SOLN
INTRAMUSCULAR | Status: AC
  Filled 2019-12-24: qty 2

## 2019-12-24 MED ORDER — ACETAMINOPHEN 500 MG PO TABS
ORAL_TABLET | ORAL | Status: AC
  Filled 2019-12-24: qty 2

## 2019-12-24 MED ORDER — GABAPENTIN 300 MG PO CAPS
ORAL_CAPSULE | ORAL | Status: AC
  Filled 2019-12-24: qty 1

## 2019-12-24 MED ORDER — SODIUM CHLORIDE 0.9 % IV SOLN
100.0000 mg | INTRAVENOUS | Status: AC
  Administered 2019-12-24: 100 mg via INTRAVENOUS
  Filled 2019-12-24: qty 100

## 2019-12-24 MED ORDER — GABAPENTIN 300 MG PO CAPS
300.0000 mg | ORAL_CAPSULE | ORAL | Status: AC
  Administered 2019-12-24: 300 mg via ORAL

## 2019-12-24 MED ORDER — OXYCODONE HCL 5 MG/5ML PO SOLN: 5.0000 mg | Freq: Once | ORAL | Status: DC | PRN

## 2019-12-24 MED ORDER — MIDAZOLAM HCL 2 MG/2ML IJ SOLN
INTRAMUSCULAR | Status: AC
  Filled 2019-12-24: qty 2

## 2019-12-24 MED ORDER — LACTATED RINGERS IV SOLN: INTRAVENOUS | Status: DC

## 2019-12-24 MED ORDER — ACETAMINOPHEN 500 MG PO TABS
1000.0000 mg | ORAL_TABLET | ORAL | Status: AC
  Administered 2019-12-24: 1000 mg via ORAL

## 2019-12-24 MED ORDER — PROPOFOL 10 MG/ML IV BOLUS
INTRAVENOUS | Status: DC | PRN
  Administered 2019-12-24: 200 mg via INTRAVENOUS

## 2019-12-24 MED ORDER — LIDOCAINE-EPINEPHRINE 1 %-1:100000 IJ SOLN
INTRAMUSCULAR | Status: DC | PRN
  Administered 2019-12-24: 20 mL

## 2019-12-24 MED ORDER — OXYTOCIN 10 UNIT/ML IJ SOLN
INTRAMUSCULAR | Status: AC
  Filled 2019-12-24: qty 2

## 2019-12-24 MED ORDER — HYDROMORPHONE HCL 1 MG/ML IJ SOLN: 0.2500 mg | INTRAMUSCULAR | Status: DC | PRN

## 2019-12-24 MED ORDER — OXYCODONE HCL 5 MG PO TABS: 5.0000 mg | ORAL_TABLET | Freq: Once | ORAL | Status: DC | PRN

## 2019-12-24 MED ORDER — MEPERIDINE HCL 25 MG/ML IJ SOLN: 6.2500 mg | INTRAMUSCULAR | Status: DC | PRN

## 2019-12-24 MED ORDER — DEXMEDETOMIDINE (PRECEDEX) IN NS 20 MCG/5ML (4 MCG/ML) IV SYRINGE
PREFILLED_SYRINGE | INTRAVENOUS | Status: AC
  Filled 2019-12-24: qty 5

## 2019-12-24 MED ORDER — FENTANYL CITRATE (PF) 100 MCG/2ML IJ SOLN
INTRAMUSCULAR | Status: DC | PRN
  Administered 2019-12-24: 100 ug via INTRAVENOUS

## 2019-12-24 MED ORDER — MISOPROSTOL 200 MCG PO TABS
ORAL_TABLET | ORAL | Status: AC
  Filled 2019-12-24: qty 5

## 2019-12-24 MED ORDER — DEXAMETHASONE SODIUM PHOSPHATE 4 MG/ML IJ SOLN
INTRAMUSCULAR | Status: DC | PRN
  Administered 2019-12-24: 5 mg via INTRAVENOUS

## 2019-12-24 MED ORDER — PROMETHAZINE HCL 25 MG/ML IJ SOLN: 6.2500 mg | INTRAMUSCULAR | Status: DC | PRN

## 2019-12-24 MED ORDER — AMISULPRIDE (ANTIEMETIC) 5 MG/2ML IV SOLN: 10.0000 mg | Freq: Once | INTRAVENOUS | Status: DC | PRN

## 2019-12-24 MED ORDER — POVIDONE-IODINE 10 % EX SWAB: 2.0000 "application " | Freq: Once | CUTANEOUS | Status: DC

## 2019-12-24 MED ORDER — MIDAZOLAM HCL 5 MG/5ML IJ SOLN
INTRAMUSCULAR | Status: DC | PRN
  Administered 2019-12-24: 2 mg via INTRAVENOUS

## 2019-12-24 MED ORDER — ONDANSETRON HCL 4 MG/2ML IJ SOLN
INTRAMUSCULAR | Status: DC | PRN
  Administered 2019-12-24: 4 mg via INTRAVENOUS

## 2019-12-24 SURGICAL SUPPLY — 23 items
CATH ROBINSON RED A/P 14FR (CATHETERS) IMPLANT
DECANTER SPIKE VIAL GLASS SM (MISCELLANEOUS) IMPLANT
FILTER UTR ASPR ASSEMBLY (MISCELLANEOUS) IMPLANT
GAUZE 4X4 16PLY RFD (DISPOSABLE) ×3 IMPLANT
GLOVE BIOGEL PI IND STRL 7.0 (GLOVE) ×2 IMPLANT
GLOVE BIOGEL PI INDICATOR 7.0 (GLOVE) ×4
GLOVE ECLIPSE 7.0 STRL STRAW (GLOVE) ×6 IMPLANT
GOWN STRL REUS W/TWL LRG LVL3 (GOWN DISPOSABLE) ×6 IMPLANT
HOSE CONNECTING 18IN BERKELEY (TUBING) IMPLANT
KIT BERKELEY 1ST TRI 3/8 NO TR (MISCELLANEOUS) ×3 IMPLANT
KIT BERKELEY 1ST TRIMESTER 3/8 (MISCELLANEOUS) ×3 IMPLANT
NS IRRIG 1000ML POUR BTL (IV SOLUTION) ×3 IMPLANT
PACK VAGINAL MINOR WOMEN LF (CUSTOM PROCEDURE TRAY) ×3 IMPLANT
PAD OB MATERNITY 4.3X12.25 (PERSONAL CARE ITEMS) ×3 IMPLANT
PAD PREP 24X48 CUFFED NSTRL (MISCELLANEOUS) ×3 IMPLANT
SET BERKELEY SUCTION TUBING (SUCTIONS) ×3 IMPLANT
SLEEVE SCD COMPRESS KNEE MED (MISCELLANEOUS) ×3 IMPLANT
TOWEL GREEN STERILE FF (TOWEL DISPOSABLE) ×6 IMPLANT
UNDERPAD 30X36 HEAVY ABSORB (UNDERPADS AND DIAPERS) ×3 IMPLANT
VACURETTE 10 RIGID CVD (CANNULA) ×3 IMPLANT
VACURETTE 7MM CVD STRL WRAP (CANNULA) IMPLANT
VACURETTE 8 RIGID CVD (CANNULA) IMPLANT
VACURETTE 9 RIGID CVD (CANNULA) IMPLANT

## 2019-12-24 NOTE — Anesthesia Preprocedure Evaluation (Signed)
Anesthesia Evaluation  Patient identified by MRN, date of birth, ID band Patient awake    Reviewed: Allergy & Precautions, NPO status , Patient's Chart, lab work & pertinent test results  Airway Mallampati: II  TM Distance: >3 FB Neck ROM: Full    Dental no notable dental hx.    Pulmonary neg pulmonary ROS,    Pulmonary exam normal breath sounds clear to auscultation       Cardiovascular negative cardio ROS Normal cardiovascular exam Rhythm:Regular Rate:Normal     Neuro/Psych Depression negative neurological ROS  negative psych ROS   GI/Hepatic negative GI ROS, Neg liver ROS,   Endo/Other  negative endocrine ROS  Renal/GU negative Renal ROS  negative genitourinary   Musculoskeletal negative musculoskeletal ROS (+)   Abdominal   Peds negative pediatric ROS (+)  Hematology negative hematology ROS (+)   Anesthesia Other Findings   Reproductive/Obstetrics negative OB ROS                             Anesthesia Physical Anesthesia Plan  ASA: II  Anesthesia Plan: General   Post-op Pain Management:    Induction: Intravenous  PONV Risk Score and Plan: 3 and Ondansetron, Dexamethasone, Midazolam and Treatment may vary due to age or medical condition  Airway Management Planned: LMA  Additional Equipment:   Intra-op Plan:   Post-operative Plan: Extubation in OR  Informed Consent: I have reviewed the patients History and Physical, chart, labs and discussed the procedure including the risks, benefits and alternatives for the proposed anesthesia with the patient or authorized representative who has indicated his/her understanding and acceptance.     Dental advisory given  Plan Discussed with: CRNA  Anesthesia Plan Comments:         Anesthesia Quick Evaluation

## 2019-12-24 NOTE — Discharge Instructions (Signed)
Dilation and Curettage or Vacuum Curettage, Care After These instructions give you information about caring for yourself after your procedure. Your doctor may also give you more specific instructions. Call your doctor if you have any problems or questions after your procedure. Follow these instructions at home: Activity  Do not drive or use heavy machinery while taking prescription pain medicine.  For 24 hours after your procedure, avoid driving.  Take short walks often, followed by rest periods. Ask your doctor what activities are safe for you. After one or two days, you may be able to return to your normal activities.  Do not lift anything that is heavier than 10 lb (4.5 kg) until your doctor approves.  For at least 2 weeks, or as long as told by your doctor: ? Do not douche. ? Do not use tampons. ? Do not have sex. General instructions   Take over-the-counter and prescription medicines only as told by your doctor. This is very important if you take blood thinning medicine.  Do not take baths, swim, or use a hot tub until your doctor approves. Take showers instead of baths.  Wear compression stockings as told by your doctor.  It is up to you to get the results of your procedure. Ask your doctor when your results will be ready.  Keep all follow-up visits as told by your doctor. This is important. Contact a doctor if:  You have very bad cramps that get worse or do not get better with medicine.  You have very bad pain in your belly (abdomen).  You cannot drink fluids without throwing up (vomiting).  You get pain in a different part of the area between your belly and thighs (pelvis).  You have bad-smelling discharge from your vagina.  You have a rash. Get help right away if:  You are bleeding a lot from your vagina. A lot of bleeding means soaking more than one sanitary pad in an hour, for 2 hours in a row.  You have clumps of blood (blood clots) coming from your  vagina.  You have a fever or chills.  Your belly feels very tender or hard.  You have chest pain.  You have trouble breathing.  You cough up blood.  You feel dizzy.  You feel light-headed.  You pass out (faint).  You have pain in your neck or shoulder area. Summary  Take short walks often, followed by rest periods. Ask your doctor what activities are safe for you. After one or two days, you may be able to return to your normal activities.  Do not lift anything that is heavier than 10 lb (4.5 kg) until your doctor approves.  Do not take baths, swim, or use a hot tub until your doctor approves. Take showers instead of baths.  Contact your doctor if you have any symptoms of infection, like bad-smelling discharge from your vagina. This information is not intended to replace advice given to you by your health care provider. Make sure you discuss any questions you have with your health care provider. Document Revised: 12/22/2016 Document Reviewed: 09/27/2015 Elsevier Patient Education  [place] Instructions  Activity: Get plenty of rest for the remainder of the day. A responsible individual must stay with you for 24 hours following the procedure.  For the next 24 hours, DO NOT: -Drive a car -Paediatric nurse -Drink alcoholic beverages -Take any medication unless instructed by your physician -Make any legal decisions or sign important papers.  Meals: Start with liquid foods such as gelatin or soup. Progress to regular foods as tolerated. Avoid greasy, spicy, heavy foods. If nausea and/or vomiting occur, drink only clear liquids until the nausea and/or vomiting subsides. Call your physician if vomiting continues.  Special Instructions/Symptoms: Your throat may feel dry or sore from the anesthesia or the breathing tube placed in your throat during surgery. If this causes discomfort, gargle with warm salt water. The discomfort should  disappear within 24 hours.  If you had a scopolamine patch placed behind your ear for the management of post- operative nausea and/or vomiting:  1. The medication in the patch is effective for 72 hours, after which it should be removed.  Wrap patch in a tissue and discard in the trash. Wash hands thoroughly with soap and water. 2. You may remove the patch earlier than 72 hours if you experience unpleasant side effects which may include dry mouth, dizziness or visual disturbances. 3. Avoid touching the patch. Wash your hands with soap and water after contact with the patch.     Post Anesthesia Home Care Instructions  Activity: Get plenty of rest for the remainder of the day. A responsible individual must stay with you for 24 hours following the procedure.  For the next 24 hours, DO NOT: -Drive a car -Advertising copywriter -Drink alcoholic beverages -Take any medication unless instructed by your physician -Make any legal decisions or sign important papers.  Meals: Start with liquid foods such as gelatin or soup. Progress to regular foods as tolerated. Avoid greasy, spicy, heavy foods. If nausea and/or vomiting occur, drink only clear liquids until the nausea and/or vomiting subsides. Call your physician if vomiting continues.  Special Instructions/Symptoms: Your throat may feel dry or sore from the anesthesia or the breathing tube placed in your throat during surgery. If this causes discomfort, gargle with warm salt water. The discomfort should disappear within 24 hours.  If you had a scopolamine patch placed behind your ear for the management of post- operative nausea and/or vomiting:  1. The medication in the patch is effective for 72 hours, after which it should be removed.  Wrap patch in a tissue and discard in the trash. Wash hands thoroughly with soap and water. 2. You may remove the patch earlier than 72 hours if you experience unpleasant side effects which may include dry mouth,  dizziness or visual disturbances. 3. Avoid touching the patch. Wash your hands with soap and water after contact with the patch.     Post Anesthesia Home Care Instructions  Activity: Get plenty of rest for the remainder of the day. A responsible individual must stay with you for 24 hours following the procedure.  For the next 24 hours, DO NOT: -Drive a car -Advertising copywriter -Drink alcoholic beverages -Take any medication unless instructed by your physician -Make any legal decisions or sign important papers.  Meals: Start with liquid foods such as gelatin or soup. Progress to regular foods as tolerated. Avoid greasy, spicy, heavy foods. If nausea and/or vomiting occur, drink only clear liquids until the nausea and/or vomiting subsides. Call your physician if vomiting continues.  Special Instructions/Symptoms: Your throat may feel dry or sore from the anesthesia or the breathing tube placed in your throat during surgery. If this causes discomfort, gargle with warm salt water. The discomfort should disappear within 24 hours.  If you had a scopolamine patch placed behind your ear for the management of post- operative nausea and/or vomiting:  1. The medication in  the patch is effective for 72 hours, after which it should be removed.  Wrap patch in a tissue and discard in the trash. Wash hands thoroughly with soap and water. 2. You may remove the patch earlier than 72 hours if you experience unpleasant side effects which may include dry mouth, dizziness or visual disturbances. 3. Avoid touching the patch. Wash your hands with soap and water after contact with the patch.    May take Tylenol after 8pm, if needed.

## 2019-12-24 NOTE — Op Note (Signed)
Jennifer York  PROCEDURE DATE: 12/24/2019  PREOPERATIVE DIAGNOSIS: retained products of conception following TAB  POSTOPERATIVE DIAGNOSIS: The same.  PROCEDURE:  Suction Dilation and Evacuation.  SURGEON:  Reva Bores  ANESTHESIA: Lowella Curb, MD  INDICATIONS: 29 y.o. who underwent medical TAB 4 wks ago with continued bleeding and u/s findings of retained POC, needing surgical completion.  Risks of surgery were discussed with the patient including but not limited to: bleeding which may require transfusion; infection which may require antibiotics; injury to uterus or surrounding organs;need for additional procedures including laparotomy or laparoscopy; possibility of intrauterine scarring which may impair future fertility; and other postoperative/anesthesia complications. Written informed consent was obtained.    FINDINGS:  A 10 wk size anteverted uterus, moderate amounts of products of conception, specimen sent to pathology.  ANESTHESIA:    Monitored intravenous sedation, paracervical block.  ESTIMATED BLOOD LOSS:  Less than 20 ml.  SPECIMENS:  Products of conception sent to pathology  COMPLICATIONS:  None immediate.  PROCEDURE DETAILS:  The patient received intravenous antibiotics while in the preoperative area.  She was then taken to the operating room where general anesthesia was administered and was found to be adequate.  After an adequate timeout was performed, she was placed in the dorsal lithotomy position and examined; then prepped and draped in the sterile manner.   . A vaginal speculum was then placed in the patient's vagina and a single tooth tenaculum was applied to the anterior lip of the cervix.  A paracervical block using 1% Lidocaine with Epinephrine was administered. The cervix was gently dilated to accommodate a 10 mm suction curette that was gently advanced to the uterine fundus.  The suction device was then activated and curette slowly rotated to clear the uterus  of products of conception.  A sharp curettage was then performed to confirm complete emptying of the uterus.There was minimal bleeding noted and the tenaculum removed with good hemostasis noted.  All insturmnent, needle and lap counts were correct x 2.The patient tolerated the procedure well.  The patient was taken to the recovery area in stable condition.  Reva Bores 12/24/2019 3:22 PM

## 2019-12-24 NOTE — Transfer of Care (Signed)
Immediate Anesthesia Transfer of Care Note  Patient: Jennifer York  Procedure(s) Performed: DILATATION AND EVACUATION (N/A Vagina )  Patient Location: PACU  Anesthesia Type:General  Level of Consciousness: drowsy and patient cooperative  Airway & Oxygen Therapy: Patient Spontanous Breathing and Patient connected to face mask oxygen  Post-op Assessment: Report given to RN and Post -op Vital signs reviewed and stable  Post vital signs: Reviewed and stable  Last Vitals:  Vitals Value Taken Time  BP 114/55 12/24/19 1528  Temp 36.6 C 12/24/19 1528  Pulse 72 12/24/19 1528  Resp 28 12/24/19 1528  SpO2 100 % 12/24/19 1528  Vitals shown include unvalidated device data.  Last Pain:  Vitals:   12/24/19 1344  TempSrc: Oral  PainSc: 4       Patients Stated Pain Goal: 5 (12/24/19 1344)  Complications: No complications documented.

## 2019-12-24 NOTE — Interval H&P Note (Signed)
History and Physical Interval Note:  12/24/2019 2:31 PM  Jennifer York  has presented today for surgery, with the diagnosis of retained products of conceptions.  The various methods of treatment have been discussed with the patient and family. After consideration of risks, benefits and other options for treatment, the patient has consented to  Procedure(s): DILATATION AND EVACUATION (N/A) as a surgical intervention.  The patient's history has been reviewed, patient examined, no change in status, stable for surgery.  I have reviewed the patient's chart and labs.  Questions were answered to the patient's satisfaction.     Reva Bores

## 2019-12-24 NOTE — Anesthesia Procedure Notes (Signed)
Procedure Name: LMA Insertion Date/Time: 12/24/2019 1:05 PM Performed by: Sheryn Bison, CRNA Pre-anesthesia Checklist: Patient identified, Emergency Drugs available, Suction available and Patient being monitored Patient Re-evaluated:Patient Re-evaluated prior to induction Oxygen Delivery Method: Circle System Utilized Preoxygenation: Pre-oxygenation with 100% oxygen Induction Type: IV induction Ventilation: Mask ventilation without difficulty LMA: LMA inserted LMA Size: 4.0 Number of attempts: 1 Airway Equipment and Method: bite block Placement Confirmation: positive ETCO2 Tube secured with: Tape Dental Injury: Teeth and Oropharynx as per pre-operative assessment

## 2019-12-24 NOTE — Anesthesia Postprocedure Evaluation (Signed)
Anesthesia Post Note  Patient: Jennifer York  Procedure(s) Performed: DILATATION AND CURETTAGE WITH SUCTION (N/A Vagina )     Patient location during evaluation: PACU Anesthesia Type: General Level of consciousness: awake and alert Pain management: pain level controlled Vital Signs Assessment: post-procedure vital signs reviewed and stable Respiratory status: spontaneous breathing, nonlabored ventilation and respiratory function stable Cardiovascular status: blood pressure returned to baseline and stable Postop Assessment: no apparent nausea or vomiting Anesthetic complications: no   No complications documented.  Last Vitals:  Vitals:   12/24/19 1545 12/24/19 1600  BP: 104/87 113/65  Pulse: 74 76  Resp: 15 16  Temp:  (!) 36.4 C  SpO2: 100% 100%    Last Pain:  Vitals:   12/24/19 1600  TempSrc:   PainSc: 0-No pain                 Lowella Curb

## 2019-12-25 ENCOUNTER — Encounter (HOSPITAL_BASED_OUTPATIENT_CLINIC_OR_DEPARTMENT_OTHER): Payer: Self-pay | Admitting: Family Medicine

## 2019-12-26 LAB — SURGICAL PATHOLOGY

## 2019-12-31 ENCOUNTER — Telehealth: Payer: Self-pay | Admitting: Family Medicine

## 2019-12-31 ENCOUNTER — Encounter (HOSPITAL_BASED_OUTPATIENT_CLINIC_OR_DEPARTMENT_OTHER): Payer: Self-pay | Admitting: Family Medicine

## 2019-12-31 NOTE — Telephone Encounter (Signed)
I left a voicemail with pt to call back to schedule Virtual Post OP 2-3 weeks with [name].  Per [name] schedule on... 01-12-20 between 9am--11am or 01-13-20 between 9-11am or 1pm-4pm or 01-14-20 between 9am--11am.. thanks

## 2020-01-01 ENCOUNTER — Telehealth: Payer: Self-pay | Admitting: Family Medicine

## 2020-01-01 NOTE — Telephone Encounter (Signed)
I left 2 voicemail's for pt to call back to  schedule virtual post op visit with [name] per [name] request.  [name] states that she can do virtual during admin on 12-20,12-21 or 12,22

## 2022-01-15 IMAGING — US US OB < 14 WKS - US OB TV - US DOPPLER
1 series · 13 of 28 positions shown · non-contrast
Comparison: None.

CLINICAL DATA: Vaginal bleeding for several weeks following recent
medical abortion



[Series 1: us ob comp less 14 wks · 13 of 161 slices shown]
[im 6/161]
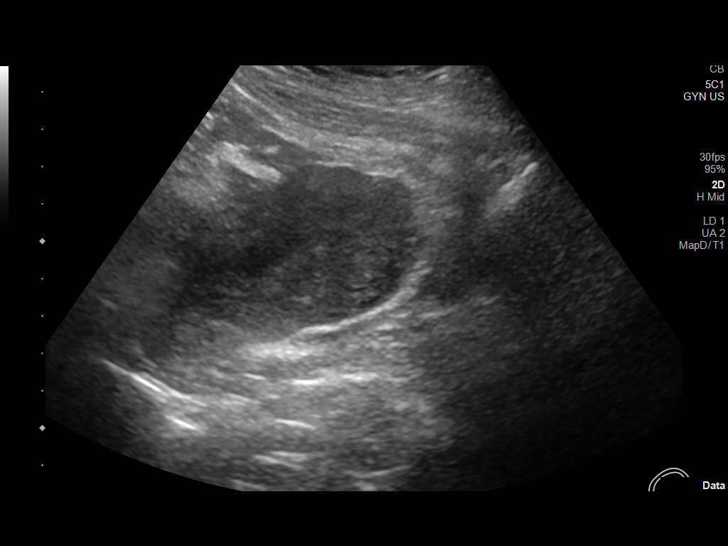
[im 18/161]
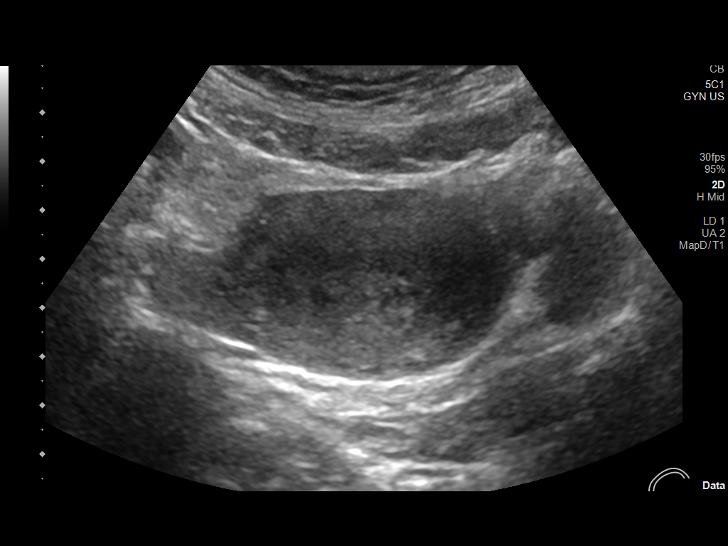
[im 30/161]
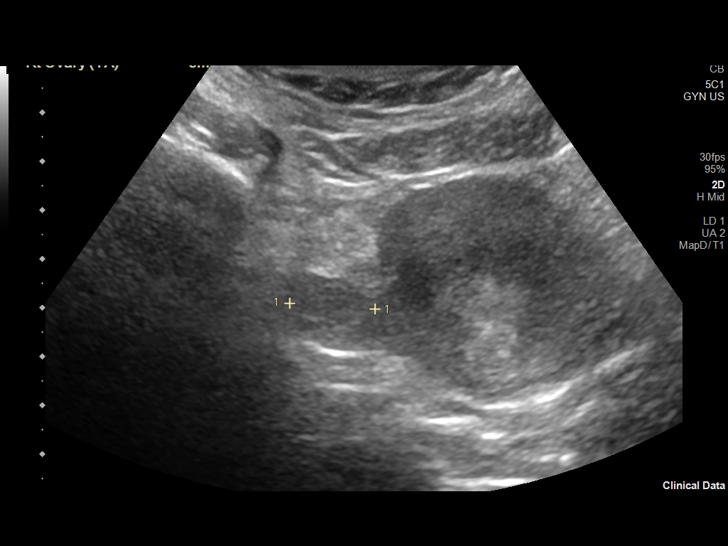
[im 42/161]
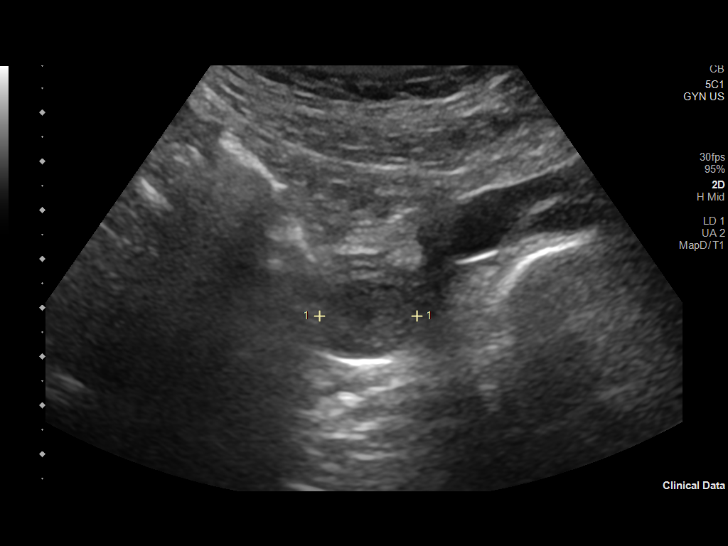
[im 54/161]
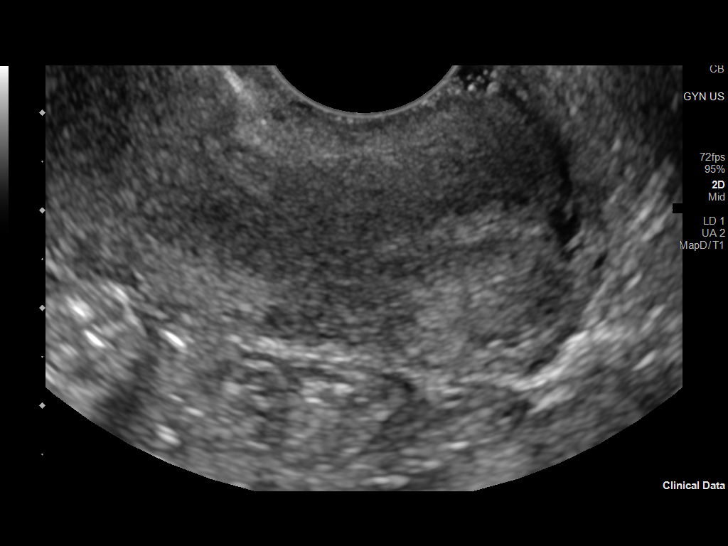
[im 66/161]
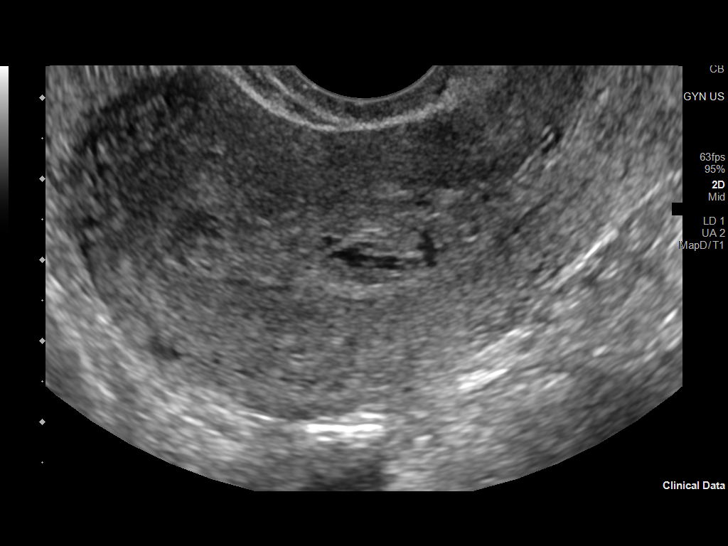
[im 83/161]
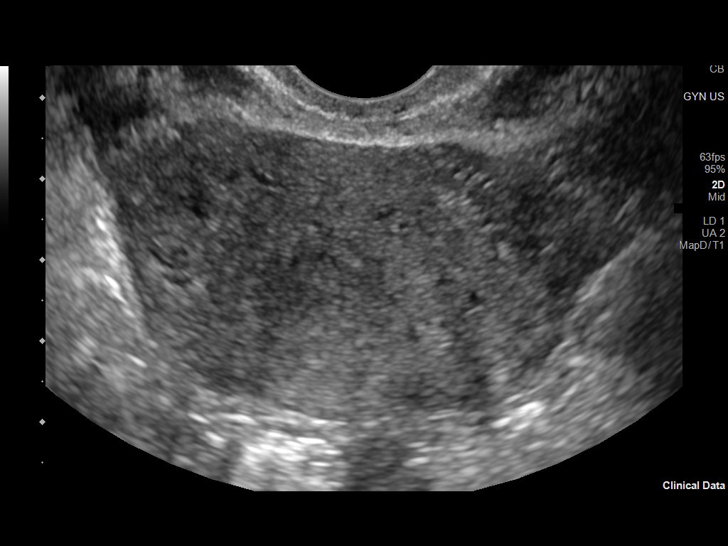
[im 95/161]
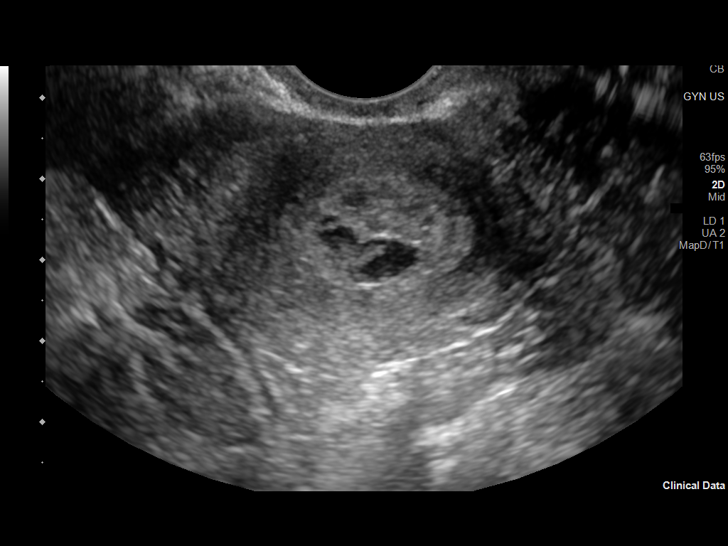
[im 107/161]
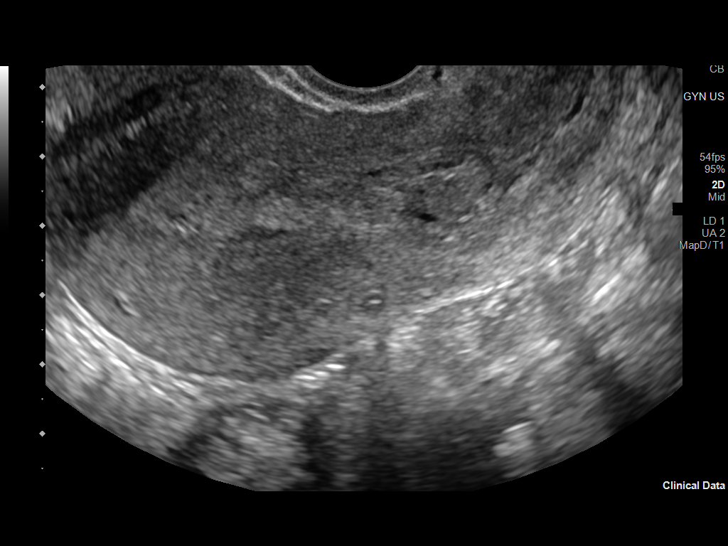
[im 119/161]
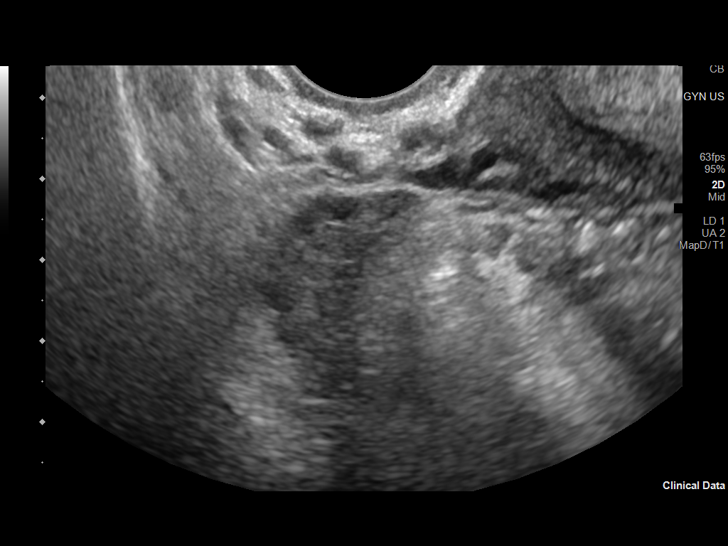
[im 131/161]
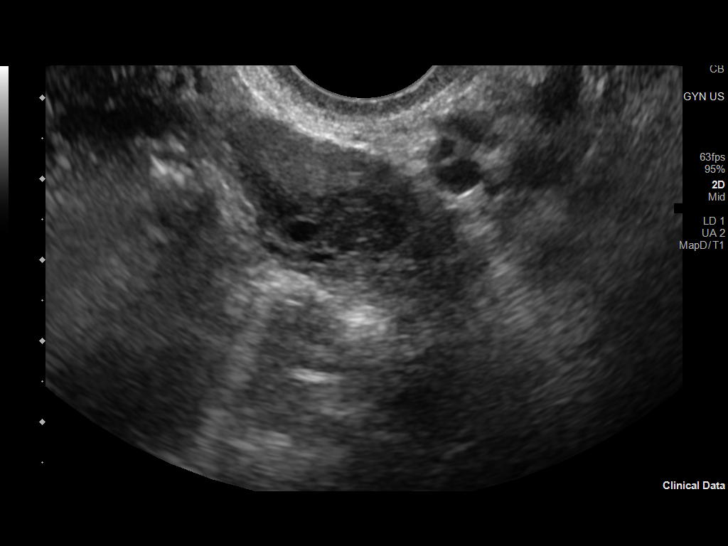
[im 143/161]
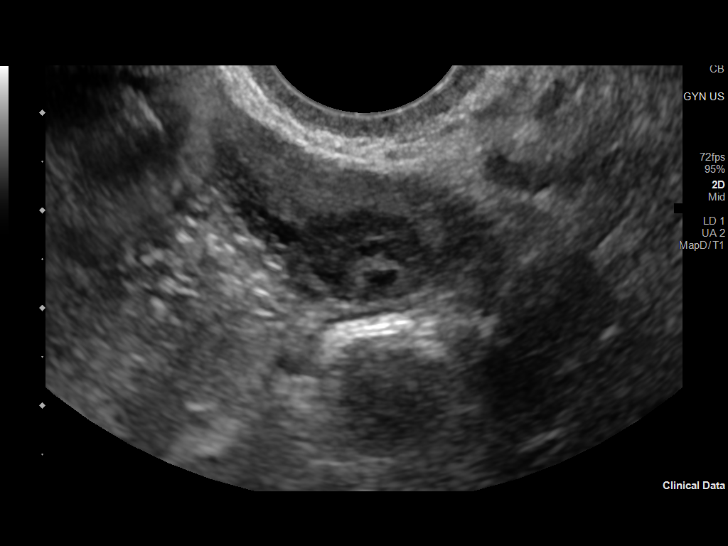
[im 155/161]
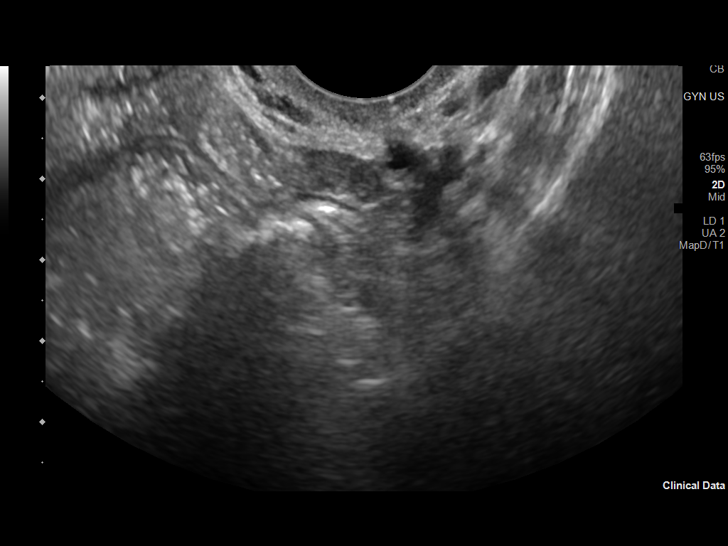

[13 of 28 positions shown; findings below may reference images not displayed]

FINDINGS: Intrauterine gestational sac: Absent

Maternal uterus/adnexae: Heterogeneous collection is noted within
the endometrial canal measuring 5.5 x 1.2 x 2.4 cm. This
demonstrates significant increased vascularity consistent with
retained products of conception. Right ovary is within normal
limits. Complex area is noted within the left ovary measuring 1.4 cm
with mild increased vascularity.

Pulsed Doppler evaluation of both ovaries demonstrates normal
appearing low-resistance arterial and venous waveforms.
IMPRESSION: Changes consistent with retained products of conception within the
endometrial canal.

Complex area within the left ovary with mild increased vascularity
of uncertain significance. This may simply represent a functional
cyst.

## 2022-04-11 ENCOUNTER — Ambulatory Visit (HOSPITAL_COMMUNITY)
Admission: EM | Admit: 2022-04-11 | Discharge: 2022-04-11 | Disposition: A | Discharge status: Home / Self Care | Payer: Medicaid Other

## 2022-04-11 ENCOUNTER — Encounter (HOSPITAL_COMMUNITY): Payer: Self-pay | Admitting: Registered Nurse

## 2022-04-11 DIAGNOSIS — F16959 Hallucinogen use, unspecified with hallucinogen-induced psychotic disorder, unspecified: Secondary | ICD-10-CM

## 2022-04-11 DIAGNOSIS — F339 Major depressive disorder, recurrent, unspecified: Secondary | ICD-10-CM | POA: Diagnosis present

## 2022-04-11 DIAGNOSIS — Z79890 Hormone replacement therapy (postmenopausal): Secondary | ICD-10-CM | POA: Insufficient documentation

## 2022-04-11 DIAGNOSIS — Z79899 Other long term (current) drug therapy: Secondary | ICD-10-CM | POA: Insufficient documentation

## 2022-04-11 DIAGNOSIS — F333 Major depressive disorder, recurrent, severe with psychotic symptoms: Secondary | ICD-10-CM | POA: Insufficient documentation

## 2022-04-11 DIAGNOSIS — Z91148 Patient's other noncompliance with medication regimen for other reason: Secondary | ICD-10-CM | POA: Insufficient documentation

## 2022-04-11 DIAGNOSIS — E063 Autoimmune thyroiditis: Secondary | ICD-10-CM | POA: Insufficient documentation

## 2022-04-11 NOTE — Discharge Instructions (Addendum)
Delta-8  and Delta-9  Tetrahydrocannabinol (THC) has psychoactive and intoxicating effect. THC is a cannabinoid, a category of chemicals that interact with the body's endocannabinoid system. By attaching to cannabinoid receptors in the brain, THC activates neurons that influence pleasure, memory, thinking, coordination, and time perception.  Delta-8/9 THC products have not been evaluated or approved by the FDA for safe use and may be marketed in ways that put the public health at risk.  Some concerns include variability in product formulations and product labeling, other cannabinoid and terpene content, and variable Delta-8/9 THC concentrations.    Side effects or adverse reactions to Delta 8 and Delta 9 Dry mouth. Vomiting. Trouble standing. Loss of consciousness. Memory loss. Hallucinations. Trouble with coordination. Changes in appetite and weight. Plus, there are other risks associated with getting high, drug tests, legality, and potential poisoning.

## 2022-04-11 NOTE — ED Provider Notes (Signed)
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 female patient presented to [facility] as a walk in accompanied by her boyfriend Jennifer York with complaints of not being herself and needing a psychiatric evaluation  Jennifer York, 31 y.o., female patient seen face to face by this provider, consulted with Dr. Hampton Abbot; and chart reviewed on 04/11/22.  On evaluation Jennifer York reports she came in today because " basically I have not been lucid as I would like to be.  I came in to get checked out because I am starting a new career.  I was admitted to [facility] emergency room yesterday.  I was told I had a psychotic break and that is why was taken to the emergency room.  A CT scan was done which came back normal and all of my blood work came back normal.  But things have been a little off for me for the last 12 days.  I have had little sleep over the last 12 days and I do not know if my behavior is related to not having any sleep."  Patient reports that her mother took her to [facility] emergency room because she took off into the woods.  Reports her mother called police.  States she had ran through the woods, broke into someone's home that was a friend of the family, but she was found naked in the woods by the police.  Reports charges are not being pressed.  Patient reports she ran into the woods because she and her family had an argument because they did not want her to leave and she just wanted to get away but family is telling a different story.  Patient reports that her mother and other family along with her boyfriend and has reported her having erratic behavior and not  being herself.  Patient reports that she been doing delta 8, 9, and marijuana.  Reports her last use was a few days prior to hospital admission.  Patient also reports that she has not been compliant with her medications.  Patient has outpatient psychiatric services with [facility] in  [place] and there have been recent medication changes that she never started.  Patient is supposed to be taking lamotrigine 100 mg daily Zyprexa 5 mg daily at bedtime, Effexor XR 37.5 mg daily benztropine 1 mg as needed guaifenesin 1 mg at bedtime.  Patient also has a diagnosis of Hashimoto's disease and is taking levothyroxine 100 mcg daily.  Went over medications with patient discussed primary use for medications and recommended starting medications.  Patient reports she is going to follow-up with [facility] in 3 weeks.  Informed if she needs to be seen sooner she can go to a walk-in hours.  At this time patient denies suicidal/self-harm/homicidal ideations, psychosis, paranoia.  Patient gave permission to speak to her boyfriend who is sitting at her side. Patient boyfriend states the patient only demonstrates at her baseline about 15 to 20% of the time.  At other times she is acting or erratic behaviors.  He reports that patient has not been taking her medication and has not started her new medication regimen.  During evaluation Jennifer York is sitting upright in chair with no noted distress.  She is alert/oriented x 4, calm, cooperative, attentive, and responses were relevant and appropriate to assessment questions.  She spoke in a clear tone at moderate volume, and normal pace, with good eye contact.   She denies suicidal/self-harm/homicidal ideation, psychosis, and paranoia.  Objectively:  there is no evidence of psychosis/mania or delusional thinking.  She conversed coherently, with goal directed thoughts, and no distractibility, or pre-occupation and she has denied suicidal/self-harm/homicidal ideation, psychosis, and paranoia.    At this time Jennifer York is educated and verbalizes understanding of mental health resources and other crisis services in the community. She is instructed to call 911 and present to the nearest emergency room should she experience any suicidal/homicidal ideation, auditory/visual/hallucinations, or detrimental worsening of her mental health condition.  She was a also advised by Probation officer that she could call the toll-free phone on back of  insurance card to assist with identifying counselors and agencies in network on back of Medicaid card to speak with care coordinator.  Patient encouraged to keep her follow-up appointment with [facility] for medication management.  Also encouraged to keep her therapy appointment.  Patient instructed to take her medications as ordered.   [place] ED from 04/11/2022 in [place] No Risk       Psychiatric Specialty Exam  Presentation  General Appearance:Appropriate for Environment  Eye Contact:Good  Speech:Clear and Coherent; Normal Rate  Speech Volume:Normal  Handedness:Right   Mood and Affect  Mood:Anxious  Affect:Congruent   Thought Process  Thought Processes:Coherent; Goal Directed  Descriptions of Associations:Circumstantial  Orientation:Full (Time, Place and Person)  Thought Content:Rumination; Logical    Hallucinations:None  Ideas of Reference:None  Suicidal Thoughts:No  Homicidal Thoughts:No   Sensorium  Memory:Immediate Good; Recent Good; Remote Fair  Judgment:Intact  Insight:Present   Executive Functions  Concentration:Fair  Attention Span:Good  [place] of Knowledge:Good  Language:Good   Psychomotor Activity  Psychomotor Activity:Normal   Assets  Assets:Communication Skills; Desire for Improvement; Housing; Leisure Time; Resilience; Social Support; Transportation   Sleep  Sleep:Fair  Number of hours: No data recorded  Physical  Exam: Physical Exam Vitals and nursing note reviewed. Exam conducted with a chaperone present.  Constitutional:      General: She is not in acute distress.    Appearance: Normal appearance. She is not ill-appearing.  HENT:     Head: Normocephalic.  Eyes:     Pupils: Pupils are equal, round, and reactive to light.  Cardiovascular:     Rate and Rhythm: Normal rate.  Pulmonary:     Effort: Pulmonary effort is normal.  Musculoskeletal:        General: Normal range of motion.     Cervical back: Normal range of motion.  Skin:    General: Skin is warm and dry.  Neurological:     Mental Status: She is alert and oriented to person, place, and time.  Psychiatric:        Attention and Perception: Attention and perception normal. She does not perceive auditory or visual hallucinations.        Mood and Affect: Affect normal. Mood is anxious.        Speech: Speech normal.        Behavior: Behavior normal. Behavior is cooperative.        Thought Content: Thought content normal. Thought content is not paranoid or delusional. Thought content does not include homicidal or suicidal ideation.        Cognition and Memory: Cognition  normal.        Judgment: Judgment is impulsive.    Review of Systems  Psychiatric/Behavioral:  Negative for suicidal ideas. Depression: Stable. Hallucinations: Denies at this time but states nervous break down last week. Substance abuse: Delt 8, 9 and THC.Insomnia: States hasn't had a good night sleep in last several days.   All other systems reviewed and are negative.  Blood pressure 107/69, pulse 65, temperature 97.6 F (36.4 C), temperature source Oral, resp. rate 18, SpO2 100 %. There is no height or weight on file to calculate BMI.  Musculoskeletal: Strength & Muscle Tone: within normal limits Gait & Station: normal Patient leans: N/A   [place] [facility] Discharge Disposition for Follow up and Recommendations: Based on my evaluation the patient does not appear to have  an emergency medical condition and can be discharged with resources and follow up care in outpatient services for Medication Management and [place], [place].   Why: Keep scheduled appointment for medication management.  Go during walk in hours if need to be seen sooner Contact information: [address] [place] [id number]                  Discharge Instructions      Delta-8  and Delta-9  Tetrahydrocannabinol [facility]) has psychoactive and intoxicating effect. THC is a cannabinoid, a category of chemicals that interact with the body's endocannabinoid system. By attaching to cannabinoid receptors in the brain, THC activates neurons that influence pleasure, memory, thinking, coordination, and time perception.  Delta-8/9 THC products have not been evaluated or approved by the FDA for safe use and may be marketed in ways that put the public health at risk.  Some concerns include variability in product formulations and product labeling, other cannabinoid and terpene content, and variable Delta-8/9 THC concentrations.    Side effects or adverse reactions to Delta 8 and Delta 9 Dry mouth. Vomiting. Trouble standing. Loss of consciousness. Memory loss. Hallucinations. Trouble with coordination. Changes in appetite and weight. Plus, there are other risks associated with getting high, drug tests, legality, and potential poisoning.     Kory Rains, NP 04/11/2022, 4:30 PM

## 2022-04-11 NOTE — Progress Notes (Signed)
   04/11/22 1355  [place] (Walk-ins at [facility] only)  How Did You Hear About [place]? Other (Comment) (boyfriend is here with patient today)  What Is the Reason for Your Visit/Call Today? ROUTINE: Jennifer York is a 32 y/o female presenting to [facility]. Accompanied by her boyfriend. She is diagnosed with MDD and GAD at the age of 32 years old.  Her complaint is "I was told that I had a psychotic break". She shares that her family has concerns that patient has not behaving her "normal self". Her mother took her [facility] yesterday to "get checked out". She had a CT scan and blood work. According to patient everything was normal. She was offered a psychiatric evaluation. However, refused and now presents here for a psychiatric evaluation. Patient states that she went 12 days w/o sleep and believes this is the reason she was not herself. Patient denies SI. No hx of suicide attempts/gestures. She has a hx of self injurious behaviors in the past (cutting, burning, pulling at skin, and clawing) herself. She last self harmed, March 2022. Denies AVH's. No alcohol or drug use. She lives with her boyfriend. States that she is "in between jobs". PAtient is cooperative/pleasant.  How Long Has This Been Causing You Problems? 1 wk - 1 month  Have You Recently Had Any Thoughts About Hurting Yourself? No  Are You Planning to Commit Suicide/Harm Yourself At This time? No  Have you Recently Had Thoughts About [place]? No  Are You Planning To Harm Someone At This Time? No  Are you currently experiencing any auditory, visual or other hallucinations? No  Have You Used Any Alcohol or Drugs in the Past 24 Hours? No  Do you have any current medical co-morbidities that require immediate attention? No  Clinician description of patient physical appearance/behavior: Calm and cooperative. Dressed casualy.  What Do You Feel Would Help You the Most Today? Treatment for Depression or other mood  problem;Medication(s)  If access to [facility] Urgent Care was not available, would you have sought care in the Emergency Department? No  Determination of Need Routine (7 days)  Options For Referral Medication Management;Outpatient Therapy

## 2022-05-18 ENCOUNTER — Ambulatory Visit (HOSPITAL_COMMUNITY)
Admission: EM | Admit: 2022-05-18 | Discharge: 2022-05-19 | Disposition: A | Discharge status: Home / Self Care | Payer: Medicaid Other | Attending: Family | Admitting: Family

## 2022-05-18 DIAGNOSIS — F333 Major depressive disorder, recurrent, severe with psychotic symptoms: Secondary | ICD-10-CM | POA: Insufficient documentation

## 2022-05-18 DIAGNOSIS — F1994 Other psychoactive substance use, unspecified with psychoactive substance-induced mood disorder: Secondary | ICD-10-CM | POA: Insufficient documentation

## 2022-05-18 DIAGNOSIS — Z79899 Other long term (current) drug therapy: Secondary | ICD-10-CM | POA: Insufficient documentation

## 2022-05-18 DIAGNOSIS — F1290 Cannabis use, unspecified, uncomplicated: Secondary | ICD-10-CM | POA: Insufficient documentation

## 2022-05-18 DIAGNOSIS — F1090 Alcohol use, unspecified, uncomplicated: Secondary | ICD-10-CM | POA: Insufficient documentation

## 2022-05-18 DIAGNOSIS — Z79890 Hormone replacement therapy (postmenopausal): Secondary | ICD-10-CM | POA: Insufficient documentation

## 2022-05-18 LAB — CBC WITH DIFFERENTIAL/PLATELET
Abs Immature Granulocytes: 0.02 10*3/uL (ref 0.00–0.07)
Basophils Absolute: 0 10*3/uL (ref 0.0–0.1)
Basophils Relative: 0 %
Eosinophils Absolute: 0.2 10*3/uL (ref 0.0–0.5)
Eosinophils Relative: 2 %
HCT: 37.2 % (ref 36.0–46.0)
Hemoglobin: 12.6 g/dL (ref 12.0–15.0)
Immature Granulocytes: 0 %
Lymphocytes Relative: 33 %
Lymphs Abs: 2.6 10*3/uL (ref 0.7–4.0)
MCH: 31.1 pg (ref 26.0–34.0)
MCHC: 33.9 g/dL (ref 30.0–36.0)
MCV: 91.9 fL (ref 80.0–100.0)
Monocytes Absolute: 0.4 10*3/uL (ref 0.1–1.0)
Monocytes Relative: 6 %
Neutro Abs: 4.6 10*3/uL (ref 1.7–7.7)
Neutrophils Relative %: 59 %
Platelets: 228 10*3/uL (ref 150–400)
RBC: 4.05 MIL/uL (ref 3.87–5.11)
RDW: 12.9 % (ref 11.5–15.5)
WBC: 7.9 10*3/uL (ref 4.0–10.5)
nRBC: 0 % (ref 0.0–0.2)

## 2022-05-18 LAB — COMPREHENSIVE METABOLIC PANEL
ALT: 16 U/L (ref 0–44)
AST: 19 U/L (ref 15–41)
Albumin: 4 g/dL (ref 3.5–5.0)
Alkaline Phosphatase: 59 U/L (ref 38–126)
Anion gap: 9 (ref 5–15)
BUN: 8 mg/dL (ref 6–20)
CO2: 26 mmol/L (ref 22–32)
Calcium: 9.2 mg/dL (ref 8.9–10.3)
Chloride: 105 mmol/L (ref 98–111)
Creatinine, Ser: 0.7 mg/dL (ref 0.44–1.00)
GFR, Estimated: >60 mL/min (ref >60)
Glucose, Bld: 93 mg/dL (ref 70–99)
Potassium: 3.9 mmol/L (ref 3.5–5.1)
Sodium: 140 mmol/L (ref 135–145)
Total Bilirubin: 0.9 mg/dL (ref 0.3–1.2)
Total Protein: 6.5 g/dL (ref 6.5–8.1)

## 2022-05-18 LAB — POCT URINE DRUG SCREEN - MANUAL ENTRY (I-SCREEN)
POC Amphetamine UR: NOT DETECTED
POC Buprenorphine (BUP): NOT DETECTED
POC Cocaine UR: NOT DETECTED
POC Marijuana UR: POSITIVE — AB
POC Methadone UR: NOT DETECTED
POC Methamphetamine UR: NOT DETECTED
POC Morphine: NOT DETECTED
POC Oxazepam (BZO): NOT DETECTED
POC Oxycodone UR: NOT DETECTED
POC Secobarbital (BAR): NOT DETECTED

## 2022-05-18 LAB — POCT PREGNANCY, URINE: Preg Test, Ur: NEGATIVE

## 2022-05-18 LAB — MAGNESIUM: Magnesium: 2.1 mg/dL (ref 1.7–2.4)

## 2022-05-18 LAB — ETHANOL: Alcohol, Ethyl (B): <10 mg/dL (ref <10)

## 2022-05-18 LAB — TSH: TSH: 0.915 u[IU]/mL (ref 0.350–4.500)

## 2022-05-18 MED ORDER — LAMOTRIGINE 100 MG PO TABS
100.0000 mg | ORAL_TABLET | Freq: Every day | ORAL | Status: DC
  Administered 2022-05-19: 100 mg via ORAL
  Filled 2022-05-18: qty 1

## 2022-05-18 MED ORDER — TRAZODONE HCL 50 MG PO TABS
50.0000 mg | ORAL_TABLET | Freq: Every evening | ORAL | Status: DC | PRN
  Administered 2022-05-18: 50 mg via ORAL
  Filled 2022-05-18: qty 1

## 2022-05-18 MED ORDER — MAGNESIUM HYDROXIDE 400 MG/5ML PO SUSP: 30.0000 mL | Freq: Every day | ORAL | Status: DC | PRN

## 2022-05-18 MED ORDER — GUANFACINE HCL 1 MG PO TABS
1.0000 mg | ORAL_TABLET | Freq: Every day | ORAL | Status: DC
  Administered 2022-05-18: 1 mg via ORAL
  Filled 2022-05-18: qty 1

## 2022-05-18 MED ORDER — LEVOTHYROXINE SODIUM 100 MCG PO TABS
100.0000 ug | ORAL_TABLET | Freq: Every day | ORAL | Status: DC
  Administered 2022-05-19: 100 ug via ORAL
  Filled 2022-05-18: qty 1

## 2022-05-18 MED ORDER — OLANZAPINE 5 MG PO TABS
5.0000 mg | ORAL_TABLET | Freq: Every day | ORAL | Status: DC
  Administered 2022-05-18: 5 mg via ORAL
  Filled 2022-05-18: qty 1

## 2022-05-18 MED ORDER — ALUM & MAG HYDROXIDE-SIMETH 200-200-20 MG/5ML PO SUSP: 30.0000 mL | ORAL | Status: DC | PRN

## 2022-05-18 MED ORDER — NICOTINE 14 MG/24HR TD PT24
14.0000 mg | MEDICATED_PATCH | Freq: Every day | TRANSDERMAL | Status: DC
  Administered 2022-05-18 – 2022-05-19 (×2): 14 mg via TRANSDERMAL
  Filled 2022-05-18 (×2): qty 1

## 2022-05-18 MED ORDER — ACETAMINOPHEN 325 MG PO TABS: 650.0000 mg | ORAL_TABLET | Freq: Four times a day (QID) | ORAL | Status: DC | PRN

## 2022-05-18 MED ORDER — LEVOTHYROXINE SODIUM 100 MCG PO TABS: 100.0000 ug | ORAL_TABLET | Freq: Every day | ORAL | Status: DC

## 2022-05-18 NOTE — Progress Notes (Signed)
05/18/22 0946  [facility] Triage Screening (Walk-ins at [facility] only)  How Did You Hear About [place]? Family/Friend (Boyfriend)  What Is the Reason for Your Visit/Call Today? Jennifer York is a 32 y/o female presenting to [facility]. Accompanied by her boyfriend Jennifer York). She was diagnosed with MDD and GAD at the age of 32 years old. Her complaint today is "I have delusions that I'm part of the [employer] and they are contacting me", "My grandiose delusions have increased over time, I'm going crazy", "I seem to think that I am [employer], now I think that I am the [facility]", "Now, I think I'm God". Patient continues to reports that she is prescribed Lamotrigine and 2 other psychiatric medications she is unable to remember. The medications are prescribed by a psychiatrist, George Hugh, at [facility] in [place], [place]. She missed her last appointment with her psychiatrist, therefore, doesn't have any more psychiatric medications. Patient w/o medications for several weeks. Patient states that she has not slept well and wakes up with manic like symptoms. Patient denies current SI. However, states that she has chronic intermittent suicidal ideations (daily) since childhood. She denies that ever has suicide plans/intent. No hx of suicide attempts/gestures. She has a hx of self injurious behaviors in the past (cutting, burning, pulling at skin, and clawing) herself. She last self harmed, 05/17/2022, by banging her head on a cement floor. States that she was trying to remove the delusions from her head. Denies current AVH's. Denies drug use. However, reports drinking [place] beer and "delta 8 drinks", 3x's per week, and when asked about the amount of use she does not provide a clear response. Patient reports last use of alcohol use was yesterday (#4 beers and #5 Delta 8 drinks). She and boyfriend living in hotels. States that she is "in between jobs". Her boyfriend states that she was let go from two jobs recently. Patient is  cooperative and grandiose. Euphoric Mood.  How Long Has This Been Causing You Problems? 1 wk - 1 month  Have You Recently Had Any Thoughts About Hurting Yourself? No  Are You Planning to Commit Suicide/Harm Yourself At This time? No  Have you Recently Had Thoughts About Hurting Someone [name]? No  Are You Planning To Harm Someone At This Time? No  Are you currently experiencing any auditory, visual or other hallucinations? No  Have You Used Any Alcohol or Drugs in the Past 24 Hours? Yes  How long ago did you use Drugs or Alcohol? Denies drug use. However, reports drinking [place] beer and "delta 8 drinks", 3x's per week, and when asked about the amount of use she does not provide a clear response. Patient reports last use of alcohol use was yesterday (#4 beers and #5 Delta 8 drinks).  What Did You Use and How Much? Denies drug use. However, reports drinking [place] beer and "delta 8 drinks", 3x's per week, and when asked about the amount of use she does not provide a clear response. Patient reports last use of alcohol use was yesterday (#4 beers and #5 Delta 8 drinks).  Do you have any current medical co-morbidities that require immediate attention? No  Clinician description of patient physical appearance/behavior: Calm and cooperative. Dressed in a bizarre manner. Patient wearing a fur coat. Appears disheveled.  What Do You Feel Would Help You the Most Today? Treatment for Depression or other mood problem;Medication(s)  If access to [facility] Urgent Care was not available, would you have sought care in the Emergency Department?  No  Determination of Need Urgent (48 hours)  Options For Referral Inpatient Hospitalization;Medication Management

## 2022-05-18 NOTE — ED Notes (Signed)
Pt is in the shower@ 9:30

## 2022-05-18 NOTE — BH Assessment (Signed)
Comprehensive Clinical Assessment (CCA) Note  05/18/2022 Jennifer York [id number]  Disposition: Patient remains voluntary. Per Jennifer Heater, NP, she will be admitted to [facility] behavioral health continuous observation for treatment and stabilization.  She will be reassessed on 05/19/2022, disposition will be determined at that time. Disposition Social Worker to seek appropriate placement.   Chief Complaint: Psychosis  Visit Diagnosis:  Severe episode of recurrent major depressive disorder, with psychotic features (HCC)   Jennifer York is a 32 y/o female presenting to [facility]. Accompanied by her boyfriend Jennifer York). She was diagnosed with MDD and GAD at the age of 32 years old. Her complaint today is "I have delusions that I'm part of the [employer] and they are contacting me", "My grandiose delusions have increased over time, I'm going crazy", "I seem to think that I am Christ, now I think that I am the [facility]", "Now, I think I'm God".   Patient prescribed Lamotrigine and 2 other psychiatric medications she is unable to remember. The medications are prescribed by a psychiatrist, Jennifer York, at [facility] in [place], [place]. She missed her last appointment with her psychiatrist, therefore, doesn't have any more psychiatric medications. Patient w/o medications for several weeks. Patient states that she has not slept well and wakes up with manic like symptoms.   Patient denies current SI. However, states that she has chronic intermittent suicidal ideations (daily) since childhood. She denies that ever has suicide plans/intent. No hx of suicide attempts/gestures. She has a hx of self injurious behaviors in the past (cutting, burning, pulling at skin, and clawing) herself. She last self harmed, 05/17/2022, by banging her head on a cement floor. States that she was trying to remove the delusions from her head. Denies current AVH's.   Denies drug use. However, reports drinking [place] beer and  "delta 8 drinks", 3x's per week, and when asked about the amount of use she does not provide a clear response. Patient reports last use of alcohol use was yesterday (#4 beers and #5 Delta 8 drinks). She and boyfriend living in hotels. States that she is "in between jobs". Her boyfriend states that she was let go from two jobs recently. Patient is cooperative and grandiose. Euphoric Mood.  During evaluation Jennifer York is sitting upright in chair with no noted distress.  She is alert/oriented x 4, calm, cooperative, attentive, and responses were relevant and appropriate to assessment questions.  She spoke in a clear tone at pressured/rapid volume, with good eye contact. There is no evidence of psychosis/mania or delusional thinking.  She conversed coherently, with goal directed thoughts, and exhibits distractibility/pre-occupation.    CCA Screening, Triage and Referral (STR)  Patient Reported Information How did you hear about [place]? Family/Friend  What Is the Reason for Your Visit/Call Today? Jennifer York is a 32 y/o female presenting to [facility]. Accompanied by her boyfriend Jennifer York). She was diagnosed with MDD and GAD at the age of 32 years old. Her complaint today is "I have delusions that I'm part of the [employer] and they are contacting me", "My grandiose delusions have increased over time, I'm going crazy", "I seem to think that I am Christ, now I think that I am the [facility]", "Now, I think I'm God". Patient continues to reports that she is prescribed Lamotrigine and 2 other psychiatric medications she is unable to remember. The medications are prescribed by a psychiatrist, Jennifer York, at [facility] in [place], [place]. She missed her last appointment with her psychiatrist, therefore, doesn't have any  more psychiatric medications. Patient w/o medications for several weeks. Patient states that she has not slept well and wakes up with manic like symptoms. Patient denies current SI. However, states  that she has chronic intermittent suicidal ideations (daily) since childhood. She denies that ever has suicide plans/intent. No hx of suicide attempts/gestures. She has a hx of self injurious behaviors in the past (cutting, burning, pulling at skin, and clawing) herself. She last self harmed, 05/17/2022, by banging her head on a cement floor. States that she was trying to remove the delusions from her head. Denies current AVH's. Denies drug use. However, reports drinking [place] beer and "delta 8 drinks", 3x's per week, and when asked about the amount of use she does not provide a clear response. Patient reports last use of alcohol use was yesterday (#4 beers and #5 Delta 8 drinks). She and boyfriend living in hotels. States that she is "in between jobs". Her boyfriend states that she was let go from two jobs recently. Patient is cooperative and grandiose. Euphoric Mood.  How Long Has This Been Causing You Problems? 1 wk - 1 month  What Do You Feel Would Help You the Most Today? Treatment for Depression or other mood problem; Medication(s)   Have You Recently Had Any Thoughts About Hurting Yourself? No  Are You Planning to Commit Suicide/Harm Yourself At This time? No   Flowsheet Row ED from 05/18/2022 in [facility] ED from 04/11/2022 in [facility]  C-SSRS RISK CATEGORY No Risk No Risk       Have you Recently Had Thoughts About Hurting Someone Jennifer York? No  Are You Planning to Harm Someone at This Time? No  Explanation: n/a   Have You Used Any Alcohol or Drugs in the Past 24 Hours? Yes  What Did You Use and How Much? Denies drug use. However, reports drinking [place] beer and "delta 8 drinks", 3x's per week, and when asked about the amount of use she does not provide a clear response. Patient reports last use of alcohol use was yesterday (#4 beers and #5 Delta 8 drinks).   Do You Currently Have a Therapist/Psychiatrist? No  Name of  Therapist/Psychiatrist: Name of Therapist/Psychiatrist: no therapist/psychiatrist.   Have You Been Recently Discharged From Any Office Practice or Programs? No  Explanation of Discharge From Practice/Program: n/a     CCA Screening Triage Referral Assessment Type of Contact: Tele-Assessment  Telemedicine Service Delivery: Telemedicine service delivery: This service was provided via telemedicine using a 2-way, interactive audio and video technology  Is this Initial or Reassessment? Is this Initial or Reassessment?: Initial Assessment  Date Telepsych consult ordered in [facility]:  Date Telepsych consult ordered in [facility]: 05/18/22  Time Telepsych consult ordered in [facility]:    Location of Assessment: [facility] Assessment Services  Provider Location: [facility] Assessment Services   Collateral Involvement: Jennifer York (Significant other)  [phone] (Mobile)   Does Patient Have a Automotive engineer Guardian? No  Legal Guardian Contact Information: no legal guardian  Copy of Legal Guardianship Form: No - copy requested  Legal Guardian Notified of Arrival: -- (n/a; no guardian)  Legal Guardian Notified of Pending Discharge: -- (n/a; no guardian)  If Minor and Not Living with Parent(s), Who has Custody? n/a  Is CPS involved or ever been involved? Never  Is APS involved or ever been involved? Never   Patient Determined To Be At Risk for Harm To Self or Others Based on Review of Patient  Reported Information or Presenting Complaint? Yes, for Self-Harm  Method: Plan without intent  Availability of Means: No access or NA  Intent: Vague intent or NA  Notification Required: No need or identified person  Additional Information for Danger to Others Potential: Active psychosis  Additional Comments for Danger to Others Potential: patient reporting delusional thoughts.  Are There Guns or Other Weapons in Your Home? No  Types of Guns/Weapons: no access to weapons or guns  Are These  Weapons Safely Secured?                            No  Who Could Verify You Are Able To Have These Secured: n/a  Do You Have any Outstanding Charges, Pending Court Dates, Parole/Probation? no  Contacted To Inform of Risk of Harm To Self or Others: No data recorded   Does Patient Present under Involuntary Commitment? No    [place] of Residence: [place]   Patient Currently Receiving the Following Services: Medication Management   Determination of Need: Urgent (48 hours)   Options For Referral: Inpatient Hospitalization; Medication Management     CCA Biopsychosocial Patient Reported Schizophrenia/Schizoaffective Diagnosis in Past: No   Strengths: n./a   Mental Health Symptoms Depression:   Difficulty Concentrating; Fatigue; Hopelessness; Irritability; Tearfulness; Worthlessness; Increase/decrease in appetite; Change in energy/activity   Duration of Depressive symptoms:  Duration of Depressive Symptoms: Greater than two weeks   Mania:   Change in energy/activity; Recklessness; Irritability; Racing thoughts   Anxiety:    Irritability; Difficulty concentrating; Tension; Restlessness; Worrying   Psychosis:   Grossly disorganized or catatonic behavior; Grossly disorganized speech; Hallucinations   Duration of Psychotic symptoms:  Duration of Psychotic Symptoms: Greater than six months   Trauma:   Hypervigilance; Irritability/anger   Obsessions:   Disrupts routine/functioning; Intrusive/time consuming   Compulsions:   Disrupts with routine/functioning; "Driven" to perform behaviors/acts; Intrusive/time consuming; Not connected to stressor   Inattention:   Avoids/dislikes activities that require focus; Poor follow-through on tasks   Hyperactivity/Impulsivity:   Difficulty waiting turn; Blurts out answers; Talks excessively   Oppositional/Defiant Behaviors:   None   Emotional Irregularity:   Mood lability   Other Mood/Personality Symptoms:   Patient is  labile with her mood.    Mental Status Exam Appearance and self-care  Stature:   Average   Weight:   Average weight   Clothing:   Neat/clean   Grooming:   Normal   Cosmetic use:   Age appropriate   Posture/gait:   Normal   Motor activity:   Not Remarkable   Sensorium  Attention:   Normal   Concentration:   Normal   Orientation:   Time; Situation; Place; Person; Object   Recall/memory:   Normal   Affect and Mood  Affect:   Depressed; Flat   Mood:   Anxious; Euthymic; Euphoric   Relating  Eye contact:   Staring   Facial expression:   Depressed; Tense; Anxious   Attitude toward examiner:   Cooperative; Dramatic   Thought and Language  Speech flow:  Pressured   Thought content:   Appropriate to Mood and Circumstances   Preoccupation:   None   Hallucinations:   None   Organization:   Disorganized; Loose   Company secretary of Knowledge:   Average   Intelligence:   Average   Abstraction:   Normal   Judgement:   Normal   Reality Testing:   Adequate  Insight:   Lacking   Decision Making:   Impulsive   Social Functioning  Social Maturity:   Impulsive   Social Judgement:   Naive   Stress  Stressors:   Housing   Coping Ability:   Overwhelmed   Skill Deficits:   Decision making; Interpersonal; Self-control   Supports:   Friends/Service system     Religion: Religion/Spirituality Are You A Religious Person?: No How Might This Affect Treatment?: n/a  Leisure/Recreation: Leisure / Recreation Do You Have Hobbies?: No  Exercise/Diet: Exercise/Diet Do You Exercise?: No Have You Gained or Lost A Significant Amount of Weight in the Past Six Months?: No Number of Pounds Lost?:  (n/a) Do You Follow a Special Diet?: No Do You Have Any Trouble Sleeping?: Yes Explanation of Sleeping Difficulties: Patient reports issues with sleeping   CCA Employment/Education Employment/Work Situation: Employment /  Work Situation Employment Situation: Unemployed Work Stressors: Recently quit 2 jobs due to her mental health symptoms Patient's Job has Been Impacted by Current Illness: No Has Patient ever Been in Equities trader?: No  Education: Education Is Patient Currently Attending School?: No Did Theme park manager?: No Did You Have An Individualized Education Program (IIEP): No Did You Have Any Difficulty At [employer]?: No Patient's Education Has Been Impacted by Current Illness: No   CCA Family/Childhood History Family and Relationship History:    Childhood History:          CCA Substance Use Alcohol/Drug Use: Alcohol / Drug Use Pain Medications: None, per pt Prescriptions: Prozac Over the Counter: None, per pt History of alcohol / drug use?: Yes Longest period of sobriety (when/how long): Denies drug use. However, reports drinking [place] beer and "delta 8 drinks", 3x's per week, and when asked about the amount of use she does not provide a clear response. Patient reports last use of alcohol use was yesterday (#4 beers and #5 Delta 8 drinks). Withdrawal Symptoms: None Substance #1 Name of Substance 1: Alcohol. 1 - Age of First Use: 32 years old 1 - Amount (size/oz): drinking [place] beer and "delta 8 drinks" 1 - Frequency: 3x's per week 1 - Duration: on-going 1 - Last Use / Amount: Patient reports last use of alcohol use was yesterday (#4 beers and #5 Delta 8 drinks). 1 - Method of Aquiring: varies 1- Route of Use: oral                       ASAM's:  Six Dimensions of Multidimensional Assessment  Dimension 1:  Acute Intoxication and/or Withdrawal Potential:      Dimension 2:  Biomedical Conditions and Complications:      Dimension 3:  Emotional, Behavioral, or Cognitive Conditions and Complications:     Dimension 4:  Readiness to Change:     Dimension 5:  Relapse, Continued use, or Continued Problem Potential:     Dimension 6:  Recovery/Living Environment:     ASAM  Severity Score:    ASAM Recommended Level of Treatment:     Substance use Disorder (SUD) Substance Use Disorder (SUD)  Checklist Symptoms of Substance Use: Continued use despite having a persistent/recurrent physical/psychological problem caused/exacerbated by use, Continued use despite persistent or recurrent social, interpersonal problems, caused or exacerbated by use, Large amounts of time spent to obtain, use or recover from the substance(s), Recurrent use that results in a failure to fulfill major role obligations (work, school, home)  Recommendations for Services/Supports/Treatments: Recommendations for Services/Supports/Treatments Recommendations For Services/Supports/Treatments: Inpatient Hospitalization, Medication Management, ACCTT (  Assertive Community Treatment)  Discharge Disposition:    DSM5 Diagnoses: Patient Active Problem List   Diagnosis Date Noted   Retained products of conception following abortion 12/23/2019   Major depressive disorder, recurrent, unspecified 03/15/2015     Referrals to Alternative Service(s): Referred to Alternative Service(s):   Place:   Date:   Time:    Referred to Alternative Service(s):   Place:   Date:   Time:    Referred to Alternative Service(s):   Place:   Date:   Time:    Referred to Alternative Service(s):   Place:   Date:   Time:     Melynda Ripple, Counselor

## 2022-05-18 NOTE — ED Notes (Signed)
Patient A&Ox4. Patient denies SI/Hi and AVH.  Patient denies any physical complaints when asked. No acute distress noted. Support and encouragement provided. Routine safety checks conducted according to facility protocol. Encouraged patient to notify staff if thoughts of harm toward self or others arise. Patient verbalize understanding and agreement. Will continue to monitor for safety.    

## 2022-05-18 NOTE — ED Notes (Signed)
Patient resting quietly in bed with eyes closed. Respirations equal and unlabored, skin warm and dry, NAD. Routine safety checks conducted according to facility protocol. Will continue to monitor for safety.  

## 2022-05-18 NOTE — ED Notes (Signed)
Patient A&Ox4. Patient denies SI/Hi and AVH. Denies any physical complaints when asked. No acute distress noted. Support and encouragement provided. Routine safety checks conducted according to facility protocol. Encouraged patient to notify staff if thoughts of harm toward self or others arise. Patient verbalize understanding and agreement. Will continue to monitor for safety. 

## 2022-05-18 NOTE — ED Provider Notes (Signed)
[facility] Urgent Care Continuous Assessment Admission H&P  Date: 05/18/22 Patient Name: Jennifer York MRN: [id number] Chief Complaint:   Diagnoses:  Final diagnoses:  MDD (major depressive disorder), recurrent, severe, with psychosis    HPI: Patient presents voluntarily to [facility] behavioral health for walk-in assessment.  Patient is accompanied by her boyfriend, [name].  Patient prefers that boyfriend remain present during assessment.  Patient is assessed by this nurse practitioner face-to-face.  She is seated in assessment area, no apparent distress.  She is alert and oriented, pleasant and cooperative during assessment.  She presents with anxious mood, congruent affect.  Jennifer states "I had a bad night last night because I am under copious amounts of stress, I was banging my head and pulling my hair.  I have continued to use delta 8, marijuana and nicotine even though my doctor has told me that it is a bad idea."  Intermittently tangential.  Patient states "it is foolish to self medicate with substances but for some safety is born in fear, it is the progression of [place]."  Recent stressors include arguing with her boyfriend related to infidelity.  Patient reports feeling verbally attacked by her boyfriend after he verbally argued with her because she cheated on him.  Argument occurred earlier this date.  Patient states "I cheated on him and slept with somebody else because I thought I was saving somebody, now I know that was not true."  Patient endorses daily use of delta 8 and marijuana.  Most recent marijuana use earlier this date.  Most recent delta 8 use on yesterday.  She uses alcohol an average of 3 times per week.  Typically consumes 4-5 drinks during each episode.  No history of alcohol-related seizure, no history of delirium tremens.  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  She is  followed by outpatient psychiatry, Dr. Marcelo Baldy with [facility].   Her medications include Lamictal, guanfacine and olanzapine.  She has been compliant with Lamictal and guanfacine.  She reports she has taken her olanzapine as needed.  She is insightful today states "I should be taking my medication the way it is ordered by the doctor, the doctor knows best."  She endorses history of 1 previous inpatient psychiatric hospitalization.  Admitted to [facility] behavioral health in February 2017.  No family mental health history reported.  Patient denies suicidal and homicidal ideations.  No previous suicide attempts.  She endorses 1 previous episode of suicidal ideations at age 48 when she thought about using a gun to end her life but did not follow through with plan.  She denies auditory and visual hallucinations currently.  There is no indication that patient is responding to internal stimuli at this time.  Jennifer reports she is currently "between homes."  She and her boyfriend are reviewing apartments this week.  They have recently resided with parents and are currently briefly in a hotel.  She denies access to weapons.  She is not currently employed, seeking employment.  She endorses decreased sleep and appetite.  Patient offered support and encouragement. Patient remains voluntary.  She verbalizes agreement with treatment plan to include admission to continuous observation unit.  Reviewed medications including olanzapine, guanfacine, Lamictal, levothyroxine and trazodone, discussed potential side effects.  Patient offered opportunity to ask questions.  Reviewed patient's desire to remain full CODE STATUS.  Total Time spent with patient: 45 minutes  Musculoskeletal  Strength & Muscle Tone: within normal limits  Gait & Station: normal Patient leans: N/A  Psychiatric Specialty Exam  Presentation General Appearance:  Appropriate for Environment; Casual  Eye Contact: Good  Speech: Clear  and Coherent; Normal Rate  Speech Volume: Normal  Handedness: Right   Mood and Affect  Mood: Anxious  Affect: Congruent   Thought Process  Thought Processes: Coherent; Goal Directed  Descriptions of Associations:Intact  Orientation:Full (Time, Place and Person)  Thought Content:Logical; Tangential    Hallucinations:Hallucinations: None  Ideas of Reference:None  Suicidal Thoughts:Suicidal Thoughts: No  Homicidal Thoughts:Homicidal Thoughts: No   Sensorium  Memory: Immediate Good; Recent Fair  Judgment: Intact  Insight: Fair   Chartered certified accountant: Fair  Attention Span: Fair  Recall: Fair  Fund of Knowledge: Good  Language: Good   Psychomotor Activity  Psychomotor Activity: Psychomotor Activity: Normal   Assets  Assets: Communication Skills; Desire for Improvement; Financial Resources/Insurance; Physical Health; Resilience; Social Support   Sleep  Sleep: Sleep: Poor   Nutritional Assessment (For OBS and FBC admissions only) Has the patient had a weight loss or gain of 10 pounds or more in the last 3 months?: No Has the patient had a decrease in food intake/or appetite?: No Does the patient have dental problems?: No Does the patient have eating habits or behaviors that may be indicators of an eating disorder including binging or inducing vomiting?: No Has the patient recently lost weight without trying?: 0 Has the patient been eating poorly because of a decreased appetite?: 0 Malnutrition Screening Tool Score: 0    Physical Exam Vitals and nursing note reviewed.  Constitutional:      Appearance: Normal appearance. She is well-developed.  HENT:     Head: Normocephalic and atraumatic.     Nose: Nose normal.  Cardiovascular:     Rate and Rhythm: Normal rate.  Pulmonary:     Effort: Pulmonary effort is normal.  Musculoskeletal:        General: Normal range of motion.     Cervical back: Normal range of  motion.  Skin:    General: Skin is warm and dry.  Neurological:     Mental Status: She is alert and oriented to person, place, and time.  Psychiatric:        Attention and Perception: Attention and perception normal.        Mood and Affect: Affect normal. Mood is anxious.        Speech: Speech is tangential.        Behavior: Behavior normal. Behavior is cooperative.        Thought Content: Thought content normal.        Cognition and Memory: Cognition normal.    Review of Systems  Constitutional: Negative.   HENT: Negative.    Eyes: Negative.   Respiratory: Negative.    Cardiovascular: Negative.   Gastrointestinal: Negative.   Genitourinary: Negative.   Musculoskeletal: Negative.   Skin: Negative.   Neurological: Negative.   Psychiatric/Behavioral:  Positive for substance abuse. The patient is nervous/anxious.     Blood pressure 127/78, pulse 82, temperature 98.2 F (36.8 C), temperature source Oral, resp. rate 18, height 5\' 7"  (1.702 m), weight 135 lb (61.2 kg), SpO2 100 %. Body mass index is 21.14 kg/m.  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 No  Has the patient been a risk to self in the past 6 months? No .    Has the  patient been a risk to self within the distant past? Yes   Is the patient a risk to others? No   Has the patient been a risk to others in the past 6 months? No   Has the patient been a risk to others within the distant past? No   Past Medical History: Retained products of conception following abortion, hypotension, hypothyroidism due to Hashimoto's thyroiditis  Family History: None reported  Social History: Unemployed, housing instability  Last Labs:  Admission on 05/18/2022  Component Date Value Ref Range Status   POC Amphetamine UR 05/18/2022 None Detected  NONE DETECTED (Cut Off Level 1000 ng/mL) Final   POC Secobarbital (BAR)  05/18/2022 None Detected  NONE DETECTED (Cut Off Level 300 ng/mL) Final   POC Buprenorphine (BUP) 05/18/2022 None Detected  NONE DETECTED (Cut Off Level 10 ng/mL) Final   POC Oxazepam (BZO) 05/18/2022 None Detected  NONE DETECTED (Cut Off Level 300 ng/mL) Final   POC Cocaine UR 05/18/2022 None Detected  NONE DETECTED (Cut Off Level 300 ng/mL) Final   POC Methamphetamine UR 05/18/2022 None Detected  NONE DETECTED (Cut Off Level 1000 ng/mL) Final   POC Morphine 05/18/2022 None Detected  NONE DETECTED (Cut Off Level 300 ng/mL) Final   POC Methadone UR 05/18/2022 None Detected  NONE DETECTED (Cut Off Level 300 ng/mL) Final   POC Oxycodone UR 05/18/2022 None Detected  NONE DETECTED (Cut Off Level 100 ng/mL) Final   POC Marijuana UR 05/18/2022 Positive (A)  NONE DETECTED (Cut Off Level 50 ng/mL) Final    Allergies: Gluten meal  Medications:  Facility Ordered Medications  Medication   acetaminophen (TYLENOL) tablet 650 mg   alum & mag hydroxide-simeth (MAALOX/MYLANTA) 200-200-20 MG/5ML suspension 30 mL   magnesium hydroxide (MILK OF MAGNESIA) suspension 30 mL   traZODone (DESYREL) tablet 50 mg   PTA Medications  Medication Sig   guanFACINE (TENEX) 1 MG tablet Take 1 mg by mouth at bedtime.   lamoTRIgine (LAMICTAL) 100 MG tablet Take 100 mg by mouth daily.   levothyroxine (SYNTHROID) 100 MCG tablet Take 100 mcg by mouth daily.   OLANZapine (ZYPREXA) 5 MG tablet Take 5 mg by mouth at bedtime.   traZODone (DESYREL) 50 MG tablet Take 50 mg by mouth at bedtime as needed for sleep.   venlafaxine (EFFEXOR) 37.5 MG tablet Take 37.5 mg by mouth daily.   benztropine (COGENTIN) 1 MG tablet Take 1 mg by mouth daily as needed for tremors.   etonogestrel (NEXPLANON) 68 MG IMPL implant Inject 68 mg into the skin once.      Medical Decision Making  Patient remains voluntary.  She will be admitted to [facility] behavioral health continuous observation for treatment and stabilization.  She will be  reassessed on 05/19/2022, disposition will be determined at that time.  Laboratory studies ordered including CBC, CMP, ethanol, magnesium, prolactin and TSH.  Urine pregnancy, urine drug screen ordered.  EKG order initiated.  Current medications: -Acetaminophen 650 mg every 6 as needed/mild pain -Maalox 30 mL oral every 4 as needed/digestion -Magnesium hydroxide 30 mL daily as needed/mild constipation -Trazodone 50 mg nightly as needed/sleep -Nicotine NicoDerm transdermal patch 14 mg daily/nicotine withdrawal  -Guanfacine 1 mg nightly -Lamotrigine 100 mg nightly -Levothyroxine 100 mcg daily at 6 AM -Olanzapine 5 mg nightly (include now)    Recommendations  Based on my evaluation the patient does not appear to have an emergency medical condition.  Lenard Lance, FNP 05/18/22  11:48 AM

## 2022-05-19 MED ORDER — OLANZAPINE 5 MG PO TABS: 5.0000 mg | ORAL_TABLET | Freq: Every day | ORAL | 0 refills | Status: DC

## 2022-05-19 NOTE — Discharge Instructions (Addendum)
Based on the information that you have provided and the presenting issues outpatient services and resources for have been recommended.  It is imperative that you follow through with treatment recommendations within 5-7 days from the of discharge to mitigate further risk to your safety and mental well-being. A list of referrals has been provided below to get you started.  You are not limited to the list provided.  In case of an urgent crisis, you may contact the Mobile Crisis Unit with [facility] at [phone].    [facility]          -(Please Call) Phone: [phone]   Provides housing and specialized case management focused on housing stability.    [facility] NIGHT [facility]LTER) [address] [place], [place] Phone: [phone]   [facility] has been providing emergency shelter to those in need of a permanent residence for over 35 years. The [employer] shelter plays an important role in our community.   There are many life events that can pull someone into a downward spiral towards poverty that is very difficult to get out of. Homelessness is a problem that can affect anyone of [place]. [employer] is a safe and comforting place to stay, especially if you have experienced the hardship of street life.   [employer] provides a single bed and bedding to 100 adult men and women. The shelter welcomes all who are in need of housing, no one in real need is turned away unless space is not available.   While staying at [facility], guests are offered more than just a bed for a night. Hot meals are provided and every guest has access to case management services. Case managers provide assistance with finding housing, employment, or other services that will help them gain stability. Continuous stay is based on availability, capacity, and progress towards goals.   To contact the front desk of [facility] please  call   [phone] ext [phone] or ext. [phone].   [facility] [address] [place], [place] [address] Phone: [phone]   [facility] (Women only) [address]Cyril Loosen [place], [place] [address] Phone: [phone]   [facility] Address: [address] [place], [place] [address] Hours:   Opens 9?AM Mon Phone: [phone]  The [facility] providers a continuum of housing services to those experiencing homelessness. They provide transitional [employer] and permanent supportive housing ([place]) to disabled veterans experiencing homelessness. There is a fast track Rapid Rehousing program couples housing stability services with temporary financial assistance to quickly house individuals and families experiencing homelessness.   [employer] [address], [place] [address] Phone: [phone]   [facility] [address] Mikle Bosworth [place], [place] [address] Phone: [phone]   [facility] [address], [place], [place] [address] Check in at 6:00PM for placement at a local shelter) Phone: [phone]   [facility] Eligibility:  Must be drug and alcohol free for at least 14 days or more at the time of application. This program serves males.  Houses Engineer, civil (consulting), Economist who serve six-month terms.  Houses are financially self-supporting; members split house expenses, which average $90.00 to $130.00 per person per week.  Any Resident who relapses must be immediately expelled. Call:  [phone]   [facility]  Address: [address], [place],  [place] [address]  Phone#: [phone] [facility] Address: [address] [place], [place] [address] Phone: [phone]  -The [facility] provides food, shelter and other programs and services to the homeless men of  [place] through our [employer] program.  By offering safe shelter, three meals a day, clean clothing, Biblical counseling, financial planning, vocational training, GED/education and employment assistance, we've helped mend the shattered lives of many homeless men since opening in 1974.  We have approximately 267 beds available, with a max of 312 beds including mats for emergency situations and currently house an average of 270 men a night.  Prospective Client Check-In Information Photo ID Required (State/ Out of State/ [facility]) - if photo ID is not available, clients are required to have a printout of a police/sheriff's criminal history report. Help out with chores around the Mission. No sex offender of any type (pending, charged, registered and/or any other sex related offenses) will be permitted to check in. Must be willing to abide by all rules, regulations, and policies established by the [employer]. The following will be provided - shelter, food, clothing, and biblical counseling. If you or someone you know is in need of assistance at our [facility] shelter in [place], [place], please call [phone] ext. [phone].  Women Shelter for [employer] hours are Monday-Friday only.     SUBSTANCE USE TREATMENT FACILITIES   [facility] [address], [place], [address] [phone] phone NOTE: [facility] is no longer offering IOP services.  Serves those who are low-income or have no insurance.  [facility] [address], [place], [place], [address] [phone] phone [phone] fax NOTE: Does have Substance Abuse-Intensive Outpatient Program [facility]) as well as transitional housing if eligible.  [facility] [address] [place], [place], [address] [phone] phone [phone] fax  [facility] [phone] [address] [place], [place], [address] [phone] phone [phone] fax  Patient is instructed prior  to discharge to:  Take all medications as prescribed by his/her mental healthcare provider. Report any adverse effects and or reactions from the medicines to his/her outpatient provider promptly. Keep all scheduled appointments, to ensure that you are getting refills on time and to avoid any interruption in your medication.  If you are unable to keep an appointment call to reschedule.  Be sure to follow-up with resources and follow-up appointments provided.  Patient has been instructed & cautioned: To not engage in alcohol and or illegal drug use while on prescription medicines. In the event of worsening symptoms, patient is instructed to call the crisis hotline, 911 and or go to the nearest ED for appropriate evaluation and treatment of symptoms. To follow-up with his/her primary care provider for your other medical issues, concerns and or health care needs.  Information: -National Suicide Prevention Lifeline 1-800-SUICIDE or (323)341-6300.  -988 offers 24/7 access to trained crisis counselors who can help people experiencing mental health-related distress. People can call or text 988 or chat 988lifeline.org for themselves or if they are worried about a loved one who may need crisis support.

## 2022-05-19 NOTE — ED Notes (Signed)
Patient A&O x 4, ambulatory. Patient discharged in no acute distress. Patient denied SI/HI, A/VH upon discharge. Patient verbalized understanding of all discharge instructions explained by staff, to include follow up appointments, RX's and safety plan. Patient reported mood 10/10.  Pt belongings returned to patient from locker #  intact. Patient escorted to lobby via staff for transport to destination. Safety maintained.   

## 2022-05-19 NOTE — ED Notes (Signed)
Patient a&o x3. Denies SI/HI/AVH,Denies withdrawal sx now..Denies intent or plan  to harm self or others. Routine conducted according to faculty protocol .Encourge patient to notify  staff with any needs or concerns. Patient verbalized agreement and understaffing.Will continue to monitor for safety. 
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
## 2022-05-20 LAB — PROLACTIN: Prolactin: 17.3 ng/mL (ref 4.8–33.4)

## 2022-07-01 ENCOUNTER — Other Ambulatory Visit: Payer: Self-pay

## 2022-07-01 ENCOUNTER — Inpatient Hospital Stay (HOSPITAL_COMMUNITY)
Admission: AD | Admit: 2022-07-01 | Discharge: 2022-07-11 | DRG: 885 | Disposition: A | Discharge status: Home / Self Care | Payer: Medicaid Other | Source: Intra-hospital | Attending: Psychiatry | Admitting: Psychiatry

## 2022-07-01 ENCOUNTER — Encounter (HOSPITAL_COMMUNITY): Payer: Self-pay | Admitting: Psychiatry

## 2022-07-01 DIAGNOSIS — Z62810 Personal history of physical and sexual abuse in childhood: Secondary | ICD-10-CM | POA: Diagnosis not present

## 2022-07-01 DIAGNOSIS — Z91411 Personal history of adult psychological abuse: Secondary | ICD-10-CM

## 2022-07-01 DIAGNOSIS — F250 Schizoaffective disorder, bipolar type: Secondary | ICD-10-CM | POA: Diagnosis present

## 2022-07-01 DIAGNOSIS — R45851 Suicidal ideations: Secondary | ICD-10-CM | POA: Diagnosis present

## 2022-07-01 DIAGNOSIS — F17220 Nicotine dependence, chewing tobacco, uncomplicated: Secondary | ICD-10-CM | POA: Diagnosis present

## 2022-07-01 DIAGNOSIS — G4700 Insomnia, unspecified: Secondary | ICD-10-CM | POA: Diagnosis present

## 2022-07-01 DIAGNOSIS — Z9151 Personal history of suicidal behavior: Secondary | ICD-10-CM | POA: Diagnosis not present

## 2022-07-01 DIAGNOSIS — Z79899 Other long term (current) drug therapy: Secondary | ICD-10-CM

## 2022-07-01 DIAGNOSIS — F332 Major depressive disorder, recurrent severe without psychotic features: Secondary | ICD-10-CM | POA: Diagnosis present

## 2022-07-01 DIAGNOSIS — F17210 Nicotine dependence, cigarettes, uncomplicated: Secondary | ICD-10-CM | POA: Diagnosis present

## 2022-07-01 DIAGNOSIS — E063 Autoimmune thyroiditis: Secondary | ICD-10-CM | POA: Diagnosis present

## 2022-07-01 DIAGNOSIS — F1220 Cannabis dependence, uncomplicated: Secondary | ICD-10-CM | POA: Diagnosis present

## 2022-07-01 DIAGNOSIS — F419 Anxiety disorder, unspecified: Secondary | ICD-10-CM | POA: Diagnosis present

## 2022-07-01 DIAGNOSIS — F17290 Nicotine dependence, other tobacco product, uncomplicated: Secondary | ICD-10-CM | POA: Diagnosis present

## 2022-07-01 DIAGNOSIS — Z91148 Patient's other noncompliance with medication regimen for other reason: Secondary | ICD-10-CM | POA: Diagnosis not present

## 2022-07-01 DIAGNOSIS — K047 Periapical abscess without sinus: Secondary | ICD-10-CM | POA: Diagnosis present

## 2022-07-01 DIAGNOSIS — F959 Tic disorder, unspecified: Secondary | ICD-10-CM | POA: Diagnosis present

## 2022-07-01 DIAGNOSIS — Z818 Family history of other mental and behavioral disorders: Secondary | ICD-10-CM | POA: Diagnosis not present

## 2022-07-01 DIAGNOSIS — Z91410 Personal history of adult physical and sexual abuse: Secondary | ICD-10-CM

## 2022-07-01 DIAGNOSIS — Z560 Unemployment, unspecified: Secondary | ICD-10-CM

## 2022-07-01 DIAGNOSIS — Z5900 Homelessness unspecified: Secondary | ICD-10-CM

## 2022-07-01 MED ORDER — MAGNESIUM HYDROXIDE 400 MG/5ML PO SUSP: 30.0000 mL | Freq: Every day | ORAL | Status: DC | PRN

## 2022-07-01 MED ORDER — TRAZODONE HCL 50 MG PO TABS
50.0000 mg | ORAL_TABLET | Freq: Every evening | ORAL | Status: DC | PRN
  Filled 2022-07-01: qty 1

## 2022-07-01 MED ORDER — DIPHENHYDRAMINE HCL 25 MG PO CAPS: 50.0000 mg | ORAL_CAPSULE | Freq: Three times a day (TID) | ORAL | Status: DC | PRN

## 2022-07-01 MED ORDER — HALOPERIDOL 5 MG PO TABS: 5.0000 mg | ORAL_TABLET | Freq: Three times a day (TID) | ORAL | Status: DC | PRN

## 2022-07-01 MED ORDER — LORAZEPAM 2 MG/ML IJ SOLN: 2.0000 mg | Freq: Three times a day (TID) | INTRAMUSCULAR | Status: DC | PRN

## 2022-07-01 MED ORDER — ALUM & MAG HYDROXIDE-SIMETH 200-200-20 MG/5ML PO SUSP: 30.0000 mL | ORAL | Status: DC | PRN

## 2022-07-01 MED ORDER — DIPHENHYDRAMINE HCL 50 MG/ML IJ SOLN: 50.0000 mg | Freq: Three times a day (TID) | INTRAMUSCULAR | Status: DC | PRN

## 2022-07-01 MED ORDER — ENSURE ENLIVE PO LIQD
237.0000 mL | Freq: Two times a day (BID) | ORAL | Status: DC
  Administered 2022-07-01 – 2022-07-11 (×14): 237 mL via ORAL
  Filled 2022-07-01 (×24): qty 237

## 2022-07-01 MED ORDER — NICOTINE POLACRILEX 2 MG MT GUM
2.0000 mg | CHEWING_GUM | OROMUCOSAL | Status: DC | PRN
  Administered 2022-07-01 – 2022-07-11 (×19): 2 mg via ORAL
  Filled 2022-07-01 (×3): qty 1

## 2022-07-01 MED ORDER — HALOPERIDOL LACTATE 5 MG/ML IJ SOLN: 5.0000 mg | Freq: Three times a day (TID) | INTRAMUSCULAR | Status: DC | PRN

## 2022-07-01 MED ORDER — ACETAMINOPHEN 325 MG PO TABS
650.0000 mg | ORAL_TABLET | Freq: Four times a day (QID) | ORAL | Status: DC | PRN
  Administered 2022-07-03 – 2022-07-10 (×6): 650 mg via ORAL
  Filled 2022-07-01 (×6): qty 2

## 2022-07-01 MED ORDER — HYDROXYZINE HCL 25 MG PO TABS
25.0000 mg | ORAL_TABLET | Freq: Three times a day (TID) | ORAL | Status: DC | PRN
  Administered 2022-07-01 – 2022-07-09 (×4): 25 mg via ORAL
  Filled 2022-07-01 (×5): qty 1

## 2022-07-01 MED ORDER — LORAZEPAM 1 MG PO TABS: 2.0000 mg | ORAL_TABLET | Freq: Three times a day (TID) | ORAL | Status: DC | PRN

## 2022-07-01 NOTE — Progress Notes (Signed)
EKG results placed on the outside of pt's shadow chart   Normal sinus rhythm Normal ECG QT/Qtc-Baz  380/424 ms 
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
## 2022-07-01 NOTE — BHH Group Notes (Signed)
Pt attended and participated in wrap up group this evening, but opted not to participate.

## 2022-07-01 NOTE — Group Note (Signed)
Date:  07/01/2022 Time:  4:27 PM  Group Topic/Focus:  Goals Group:   The focus of this group is to help patients establish daily goals to achieve during treatment and discuss how the patient can incorporate goal setting into their daily lives to aide in recovery. Orientation:   The focus of this group is to educate the patient on the purpose and policies of crisis stabilization and provide a format to answer questions about their admission.  The group details unit policies and expectations of patients while admitted.    Participation Level:  Did Not Attend  Participation Quality:   n/a  Affect:   n/a  Cognitive:   n/a  Insight: None  Engagement in Group:   n/a  Modes of Intervention:   n/a  Additional Comments:   Pt did not attend.  Edmund Hilda Novi Calia 07/01/2022, 4:27 PM

## 2022-07-01 NOTE — Group Note (Signed)
Date:  07/01/2022 Time:  4:46 PM  Group Topic/Focus:  Dimensions of Wellness:   The focus of this group is to introduce the topic of wellness and discuss the role each dimension of wellness plays in total health.    Participation Level:  Did Not Attend  Participation Quality:   n/a  Affect:   n/a  Cognitive:   n/a  Insight: None  Engagement in Group:   n/a  Modes of Intervention:   n/a  Additional Comments:   Pt did not attend.  Edmund Hilda Cadi Rhinehart 07/01/2022, 4:46 PM

## 2022-07-01 NOTE — Progress Notes (Signed)
   07/01/22 2244  Psych Admission Type (Psych Patients Only)  Admission Status Involuntary  Psychosocial Assessment  Patient Complaints Anxiety  Eye Contact Fair  Facial Expression Animated  Affect Appropriate to circumstance  Speech Logical/coherent  Interaction Assertive  Motor Activity Fidgety  Appearance/Hygiene Unremarkable  Behavior Characteristics Appropriate to situation  Mood Pleasant;Preoccupied  Thought Process  Coherency WDL  Content WDL  Delusions None reported or observed  Perception WDL  Hallucination None reported or observed  Judgment Impaired  Confusion None  Danger to Self  Current suicidal ideation? Denies  Agreement Not to Harm Self Yes  Description of Agreement verbal  Danger to Others  Danger to Others None reported or observed

## 2022-07-01 NOTE — Progress Notes (Addendum)
Patient is a 32 year old female who presented from [facility] hospital under IVC- per mom- for exhibiting bizarre behavior, making suicidal statements, and threatening, becoming combative towards family members. Per report, pt has refused to take her prescribed medications and has not been sleeping, nor eating and has been unable to care for herself. Pt has a hx of MDD and was hospitalized here at [facility] in 2017. Pt currently denies SI/HI and A/VH and does not appear to be responding to internal stimuli. Pt does present a little odd at times- when pt was gathering numbers from her cell phone, she copied phone numbers (a long list) without attaching names to the numbers. This went on for several minutes until staff  had to cut it short and lock up the phone. Pt answered questions logically and coherently, but vague at times.  VS monitored and recorded. Skin check performed. Belongings searched and secured in locker. Admission paperwork completed.  Patient was oriented to unit and schedule. Q 15 min checks initiated for safety. Pt provided with po fluids and lunch

## 2022-07-01 NOTE — Group Note (Signed)
Date:  07/01/2022 Time:  6:15 PM  Group Topic/Focus:  Support and Check-in-Open Discussion and Journaling    Participation Level:  Did Not Attend  Participation Quality:   n/a  Affect:   n/a  Cognitive:   n/a  Insight: None  Engagement in Group:   n/a  Modes of Intervention:   n/a  Additional Comments:   Pt did not attend.  Edmund Hilda Sly Parlee 07/01/2022, 6:15 PM

## 2022-07-01 NOTE — Tx Team (Signed)
Initial Treatment Plan 07/01/2022 3:44 PM [name] Safley ION:[id number]    PATIENT STRESSORS: Financial difficulties   Marital or family conflict     PATIENT STRENGTHS: Average or above average intelligence  Communication skills  General fund of knowledge  Physical Health     Increased anxiety    Insomnia/poor appetite               DISCHARGE CRITERIA:  Improved stabilization in mood, thinking, and/or behavior Need for constant or close observation no longer present Reduction of life-threatening or endangering symptoms to within safe limits Verbal commitment to aftercare and medication compliance  PRELIMINARY DISCHARGE PLAN: Outpatient therapy Return to previous living arrangement  PATIENT/FAMILY INVOLVEMENT: This treatment plan has been presented to and reviewed with the patient, [name] Cadet, .  The patient has been given the opportunity to ask questions and make suggestions.  Shela Nevin, RN 07/01/2022, 3:44 PM

## 2022-07-02 DIAGNOSIS — F250 Schizoaffective disorder, bipolar type: Secondary | ICD-10-CM

## 2022-07-02 DIAGNOSIS — G4700 Insomnia, unspecified: Secondary | ICD-10-CM | POA: Diagnosis present

## 2022-07-02 DIAGNOSIS — F959 Tic disorder, unspecified: Secondary | ICD-10-CM | POA: Diagnosis present

## 2022-07-02 DIAGNOSIS — F1220 Cannabis dependence, uncomplicated: Secondary | ICD-10-CM | POA: Diagnosis present

## 2022-07-02 MED ORDER — GUANFACINE HCL ER 1 MG PO TB24
1.0000 mg | ORAL_TABLET | Freq: Every day | ORAL | Status: DC
  Administered 2022-07-03 – 2022-07-11 (×9): 1 mg via ORAL
  Filled 2022-07-02 (×13): qty 1

## 2022-07-02 MED ORDER — LAMOTRIGINE 100 MG PO TABS
100.0000 mg | ORAL_TABLET | Freq: Every day | ORAL | Status: DC
  Administered 2022-07-03: 100 mg via ORAL
  Filled 2022-07-02 (×3): qty 1

## 2022-07-02 MED ORDER — RISPERIDONE 1 MG PO TBDP
1.0000 mg | ORAL_TABLET | Freq: Two times a day (BID) | ORAL | Status: DC
  Administered 2022-07-02 – 2022-07-04 (×4): 1 mg via ORAL
  Filled 2022-07-02 (×9): qty 1

## 2022-07-02 MED ORDER — TRAZODONE HCL 50 MG PO TABS
50.0000 mg | ORAL_TABLET | Freq: Every evening | ORAL | Status: DC | PRN
  Administered 2022-07-03 – 2022-07-09 (×4): 50 mg via ORAL
  Filled 2022-07-02 (×4): qty 1

## 2022-07-02 NOTE — Progress Notes (Addendum)
   07/02/22 1000  Psych Admission Type (Psych Patients Only)  Admission Status Involuntary  Psychosocial Assessment  Patient Complaints None  Eye Contact Fair  Facial Expression Animated  Affect Appropriate to circumstance  Speech Logical/coherent  Interaction Assertive  Motor Activity Fidgety  Appearance/Hygiene Unremarkable  Behavior Characteristics Cooperative  Mood Pleasant;Preoccupied  Thought Process  Coherency WDL  Content WDL  Delusions None reported or observed  Perception WDL  Hallucination None reported or observed  Judgment Impaired  Confusion None  Danger to Self  Current suicidal ideation? Denies  Agreement Not to Harm Self Yes  Description of Agreement agrees to contact staff before acting on harmful thoughts  Danger to Others  Danger to Others None reported or observed

## 2022-07-02 NOTE — [facility] Suicide Risk Assessment (Signed)
Suicide Risk Assessment  Admission Assessment    [facility] Admission Suicide Risk Assessment   Nursing information obtained from:  Patient Demographic factors:  Unemployed, Low socioeconomic status Current Mental Status:  Suicidal ideation indicated by others Loss Factors:  Decrease in vocational status Historical Factors:  Impulsivity Risk Reduction Factors:  NA  Total Time spent with patient: 1.5 hours Principal Problem: Schizoaffective disorder, bipolar type (HCC) Diagnosis:  Principal Problem:   Schizoaffective disorder, bipolar type (HCC) Active Problems:   Delta-9-tetrahydrocannabinol (THC) dependence (HCC)   Insomnia   Tic disorder, unspecified  Subjective Data:  "I was IVC'd to [facility] by my mother Noeli Lavery) who said I was making threats to people in my parents household."     Continued Clinical Symptoms: Depressive symptoms as well as psychosis in need of treatment and stabilization, requires continuous hospitalization to achieve this. Alcohol Use Disorder Identification Test Final Score (AUDIT): 1 The "Alcohol Use Disorders Identification Test", Guidelines for Use in Primary Care, Second Edition.  World Science writer [facility]). Score between 0-7:  no or low risk or alcohol related problems. Score between 8-15:  moderate risk of alcohol related problems. Score between 16-19:  high risk of alcohol related problems. Score 20 or above:  warrants further diagnostic evaluation for alcohol dependence and treatment.  CLINICAL FACTORS:   More than one psychiatric diagnosis Previous Psychiatric Diagnoses and Treatments  Musculoskeletal: Strength & Muscle Tone: within normal limits Gait & Station: normal Patient leans: N/A  Psychiatric Specialty Exam:  Presentation  General Appearance:  Disheveled  Eye Contact: Fair  Speech: Clear and Coherent  Speech Volume: Normal  Handedness: Right   Mood and Affect  Mood: Depressed;  Anxious  Affect: Appropriate; Congruent   Thought Process  Thought Processes: Disorganized  Descriptions of Associations:Tangential  Orientation:Full (Time, Place and Person)  Thought Content:Illogical  History of Schizophrenia/Schizoaffective disorder:No  Duration of Psychotic Symptoms:N/A  Hallucinations:Hallucinations: None  Ideas of Reference:Paranoia; Percusatory; Delusions  Suicidal Thoughts:Suicidal Thoughts: No  Homicidal Thoughts:Homicidal Thoughts: No   Sensorium  Memory: Immediate Good  Judgment: Poor  Insight: Poor   Executive Functions  Concentration: Poor  Attention Span: Fair  Recall: Poor  Fund of Knowledge: Poor  Language: Fair   Psychomotor Activity  Psychomotor Activity: Psychomotor Activity: Normal   Assets  Assets: Social Support   Sleep  Sleep: Sleep: Poor    Physical Exam: Physical Exam Review of Systems  Neurological:  Negative for dizziness.  Psychiatric/Behavioral:  Positive for depression, hallucinations and substance abuse. Negative for memory loss and suicidal ideas. The patient is nervous/anxious and has insomnia.    Blood pressure 108/81, pulse 99, temperature 99.2 F (37.3 C), temperature source Oral, resp. rate 20, height 5\' 7"  (1.702 m), weight 59.4 kg, SpO2 99 %. Body mass index is 20.52 kg/m.   COGNITIVE FEATURES THAT CONTRIBUTE TO RISK:  None    SUICIDE RISK:   Minimal: No identifiable suicidal ideation.  Patients presenting with no risk factors but with morbid ruminations; may be classified as minimal risk based on the severity of the depressive symptoms  PLAN OF CARE: See H & P  I certify that inpatient services furnished can reasonably be expected to improve the patient's condition.   Starleen Blue, NP 07/02/2022, 6:15 PM

## 2022-07-02 NOTE — Group Note (Unsigned)
Date:  07/02/2022 Time:  7:47 PM  Group Topic/Focus:  Goals Group:   The focus of this group is to help patients establish daily goals to achieve during treatment and discuss how the patient can incorporate goal setting into their daily lives to aide in recovery. Orientation:   The focus of this group is to educate the patient on the purpose and policies of crisis stabilization and provide a format to answer questions about their admission.  The group details unit policies and expectations of patients while admitted.     Participation Level:  {[facility] PARTICIPATION ZOXWR:60454}  Participation Quality:  {[facility] PARTICIPATION QUALITY:22265}  Affect:  {[facility] AFFECT:22266}  Cognitive:  {[facility] COGNITIVE:22267}  Insight: {[facility] Insight2:20797}  Engagement in Group:  {[facility] ENGAGEMENT IN UJWJX:91478}  Modes of Intervention:  {[facility] MODES OF INTERVENTION:22269}  Additional Comments:  ***  Jaquita Rector 07/02/2022, 7:47 PM

## 2022-07-02 NOTE — Progress Notes (Signed)
The patient rated her day as an 8 out of a possible 10. She states that she is grateful for being alive and in the hospital. Her goal for tomorrow is to try and "get better".

## 2022-07-02 NOTE — Group Note (Signed)
Date:  07/02/2022 Time:  4:45 PM  Group Topic/Focus:  Dimensions of Wellness:   The focus of this group is to introduce the topic of wellness and discuss the role each dimension of wellness plays in total health. Emotional Education:   The focus of this group is to discuss what feelings/emotions are, and how they are experienced.    Participation Level:  none  Participation Quality:  none  Affect:  flat  Cognitive:  n/a  Insight: none  Engagement in Group: none  Modes of Intervention:  Activity, Discussion, and Orientation  Additional Comments:   Pt attended the Emotional Wellness group however did not participate.  Jennifer York 07/02/2022, 4:45 PM

## 2022-07-02 NOTE — H&P (Cosign Needed Addendum)
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 LOS: 5-7 days Discharge Concerns: Need to establish a safety plan; Medication compliance and effectiveness Discharge Goals: Return home with outpatient referrals for mental health follow-up including medication management/psychotherapy  I certify that inpatient services furnished can  reasonably be expected to improve the patient's condition.    Starleen Blue, NP 6/9/20246:13 PM

## 2022-07-02 NOTE — Group Note (Signed)
Date:  07/02/2022 Time:  4:19 PM  Group Topic/Focus:  Goals Group:   The focus of this group is to help patients establish daily goals to achieve during treatment and discuss how the patient can incorporate goal setting into their daily lives to aide in recovery. Orientation:   The focus of this group is to educate the patient on the purpose and policies of crisis stabilization and provide a format to answer questions about their admission.  The group details unit policies and expectations of patients while admitted.    Participation Level:  Minimal  Participation Quality:  Inattentive  Affect:  Blunted  Cognitive:  Appropriate  Insight: None  Engagement in Group:  None  Modes of Intervention:  Activity, Discussion, and Orientation  Additional Comments:   Pt attended the Orientation/ Goals group however did not participate.  Edmund Hilda Charmion Hapke 07/02/2022, 4:19 PM

## 2022-07-03 ENCOUNTER — Encounter (HOSPITAL_COMMUNITY): Payer: Self-pay

## 2022-07-03 DIAGNOSIS — F250 Schizoaffective disorder, bipolar type: Secondary | ICD-10-CM | POA: Diagnosis not present

## 2022-07-03 MED ORDER — LAMOTRIGINE 25 MG PO TABS
25.0000 mg | ORAL_TABLET | Freq: Two times a day (BID) | ORAL | Status: DC
  Administered 2022-07-04 – 2022-07-11 (×15): 25 mg via ORAL
  Filled 2022-07-03 (×20): qty 1

## 2022-07-03 NOTE — Group Note (Signed)
Date:  07/03/2022 Time:  9:28 PM  Group Topic/Focus:  Wrap-Up Group:   The focus of this group is to help patients review their daily goal of treatment and discuss progress on daily workbooks.    Participation Level:  Active  Participation Quality:  Appropriate  Affect:  Appropriate  Cognitive:  Appropriate  Insight: Appropriate  Engagement in Group:  Engaged  Modes of Intervention:  Education and Exploration  Additional Comments: Patient attended and participated in group tonight. She reports that the significant thing about her day was that the group went outside and she had a chance to play basketball.  Lita Mains [facility] 07/03/2022, 9:28 PM

## 2022-07-03 NOTE — Group Note (Signed)
LCSW Group Therapy Note  Group Date: 07/03/2022 Start Time: 1300 End Time: 1400   Type of Therapy and Topic:  Group Therapy - How To Cope with Nervousness about Discharge   Participation Level:  Active   Description of Group This process group involved identification of patients' feelings about discharge. Some of them are scheduled to be discharged soon, while others are new admissions, but each of them was asked to share thoughts and feelings surrounding discharge from the hospital. One common theme was that they are excited at the prospect of going home, while another was that many of them are apprehensive about sharing why they were hospitalized. Patients were given the opportunity to discuss these feelings with their peers in preparation for discharge.  Therapeutic Goals  Patient will identify their overall feelings about pending discharge. Patient will think about how they might proactively address issues that they believe will once again arise once they get home (i.e. with parents). Patients will participate in discussion about having hope for change.   Summary of Patient Progress:  Jennifer York was very active throughout the session. She demonstrated fair insight into the subject matter, and proved open to input from peers and feedback from CSW. She was respectful of peers and participated throughout the entire session.   Therapeutic Modalities Cognitive Behavioral Therapy   Beatris Si, LCSW 07/03/2022  3:44 PM

## 2022-07-03 NOTE — Progress Notes (Signed)
   07/03/22 2100  Psych Admission Type (Psych Patients Only)  Admission Status Involuntary  Psychosocial Assessment  Patient Complaints None  Eye Contact Fair  Facial Expression Anxious  Affect Euphoric;Preoccupied  Speech Logical/coherent  Interaction Assertive  Motor Activity Slow  Appearance/Hygiene Unremarkable  Behavior Characteristics Cooperative;Anxious  Mood Pleasant;Anxious  Aggressive Behavior  Effect No apparent injury  Thought Process  Coherency WDL  Content WDL  Delusions Paranoid  Perception WDL  Hallucination None reported or observed  Judgment Impaired  Confusion None  Danger to Self  Current suicidal ideation? Denies

## 2022-07-03 NOTE — Progress Notes (Cosign Needed Addendum)
[facility] MD Progress Note  07/03/2022 2:37 PM [name] Hainline  MRN:  [id number]  Principal Problem: Schizoaffective disorder, bipolar type (HCC) Diagnosis: Principal Problem:   Schizoaffective disorder, bipolar type (HCC) Active Problems:   Delta-9-tetrahydrocannabinol (THC) dependence (HCC)   Insomnia   Tic disorder, unspecified  Reason For Admission: Pt is a 32 y.o. female presenting to [facility] under IVC from [facility] petitioned by her mother Loyda Zahner) and IVC paperwork states that "Respondent has been diagnosed with major depressive disorder, psychosis, and an altered mental status. She refused to take her prescribed medication and extremely combative towards family members. She has threatened to kill herself, mother, father, and brother. Her mother had to hide all sharp objects in the home after she found her rubbing a knife up and down her arm. She is not sleeping nor eating and is unable to care for herself. Respondent is in need of a mental health evaluation."    Patient was involuntarily transferred to this [facility] on 07/01/22 for treatment and stabilization of her mental status.  24 hr chart review: Sleep Hours last night: Not documented, but pt reports that her sleep quality last night was good.  Nursing Concerns: Refused AM labs, exhibiting bizarre behaviors including refusals to leave the cafeteria after meal times, staring at, tv, laughing inappropriately.  Behavioral episodes in the past 24 hrs:As above Medication Compliance: Compliant  Vital Signs in the past 24 hrs: WNL PRN Medications in the past 24 hrs: Hydroxyzine & Nicorette gum  Patient assessment note:  During encounter today, pt posed bizarrely and staring at the tv in the day room while uttering: "It's is 30 seconds. I have to wait for this commercial to be over to speak to you.Marland KitchenMarland KitchenWait!" Tv in the day room was posed prior to pt leaving to speak with Clinical research associate.  During interaction with Clinical research associate, pt presents with +AH,  delusions of persecution & paranoia; She repeatedly states that her father, boyfriend, mother and brother are out to harm her, she reports that hears God's voice directly speaking to her sometimes, and states that she also hears it via "the breeze, birds, people. You just have to pay attention." She reports that other people on the unit are talking about her, states that she believes other people know what she is thinking and states that other people can insert thoughts into her head and also take them out.  Pt denies SI/HI/VH, reports that sleep last night was good, reports a good appetite, reports that last BM was yesterday, not noted to have any medication related side effects. No TD/EPS type symptoms found on assessment, and pt denies any feelings of stiffness. AIMS: 0.   We started Lamictal yesterday at 100 mg daily because pt initially stated that she was compliant with this medication. She states today that she had not been taking the medication consistently prior to admission. We will reduced the dose to 25 BID mg and slowly titrate upwards due to the risk of SJS. Pt has been educated on this. We are continuing other medications as well as the Risperdal 1 mg BID for psychosis as listed below. Positive reinforcements have been provided to pt to comply with getting blood draws done for his labs, and he has verbalized understanding. Moved to 500 hall as behaviors being exhibited in the context of current psychosis are disruptive to the programming on the 300 hall. We will continue to monitor symptoms.  Total Time spent with patient: 45 minutes  Past Psychiatric History: See H &  P  Past Medical History:  Past Medical History:  Diagnosis Date   Family history of adverse reaction to anesthesia    father has woken up during surgery   Medical history non-contributory     Past Surgical History:  Procedure Laterality Date   DILATION AND EVACUATION N/A 12/24/2019   Procedure: SUCTION DILATION AND  EVACUATION;  Surgeon: Reva Bores, MD;  Location: [facility];  Service: Gynecology;  Laterality: N/A;   Family History:  Family History  Problem Relation Age of Onset   Depression Mother    Depression Father    Depression Brother    Family Psychiatric  History: See H & P Social History:  Social History   Substance and Sexual Activity  Alcohol Use Yes   Comment: drinks 'occasionally'     Social History   Substance and Sexual Activity  Drug Use Yes   Types: Marijuana   Comment: smokes weed 'occasionally'    Social History   Socioeconomic History   Marital status: Single    Spouse name: Not on file   Number of children: Not on file   Years of education: Not on file   Highest education level: Not on file  Occupational History   Not on file  Tobacco Use   Smoking status: Every Day    Packs/day: 1    Types: Cigarettes   Smokeless tobacco: Current    Types: Chew  Vaping Use   Vaping Use: Every day   Substances: Nicotine  Substance and Sexual Activity   Alcohol use: Yes    Comment: drinks 'occasionally'   Drug use: Yes    Types: Marijuana    Comment: smokes weed 'occasionally'   Sexual activity: Yes    Birth control/protection: None  Other Topics Concern   Not on file  Social History Narrative   Not on file   Social Determinants of Health   Financial Resource Strain: Not on file  Food Insecurity: Patient Declined (07/01/2022)   Hunger Vital Sign    Worried About Running Out of Food in the Last Year: Patient declined    Ran Out of Food in the Last Year: Patient declined  Transportation Needs: Patient Declined (07/01/2022)   PRAPARE - Administrator, Civil Service (Medical): Patient declined    Lack of Transportation (Non-Medical): Patient declined  Physical Activity: Not on file  Stress: Not on file  Social Connections: Not on file   Sleep: Good  Appetite:  Good  Current Medications: Current Facility-Administered Medications   Medication Dose Route Frequency Provider Last Rate Last Admin   acetaminophen (TYLENOL) tablet 650 mg  650 mg Oral Q6H PRN White, Patrice L, NP       alum & mag hydroxide-simeth (MAALOX/MYLANTA) 200-200-20 MG/5ML suspension 30 mL  30 mL Oral Q4H PRN White, Patrice L, NP       diphenhydrAMINE (BENADRYL) capsule 50 mg  50 mg Oral TID PRN White, Patrice L, NP       Or   diphenhydrAMINE (BENADRYL) injection 50 mg  50 mg Intramuscular TID PRN White, Patrice L, NP       feeding supplement (ENSURE ENLIVE / ENSURE PLUS) liquid 237 mL  237 mL Oral BID BM Rex Kras, MD   237 mL at 07/02/22 2129   guanFACINE (INTUNIV) ER tablet 1 mg  1 mg Oral Daily Mitsue Peery, NP   1 mg at 07/03/22 0747   haloperidol (HALDOL) tablet 5 mg  5 mg Oral TID  PRN Liborio Nixon L, NP       Or   haloperidol lactate (HALDOL) injection 5 mg  5 mg Intramuscular TID PRN White, Patrice L, NP       hydrOXYzine (ATARAX) tablet 25 mg  25 mg Oral TID PRN White, Patrice L, NP   25 mg at 07/01/22 2116   [START ON 07/04/2022] lamoTRIgine (LAMICTAL) tablet 25 mg  25 mg Oral Q12H Massengill, Nathan, MD       LORazepam (ATIVAN) tablet 2 mg  2 mg Oral TID PRN White, Patrice L, NP       Or   LORazepam (ATIVAN) injection 2 mg  2 mg Intramuscular TID PRN White, Patrice L, NP       magnesium hydroxide (MILK OF MAGNESIA) suspension 30 mL  30 mL Oral Daily PRN White, Patrice L, NP       nicotine polacrilex (NICORETTE) gum 2 mg  2 mg Oral PRN Rex Kras, MD   2 mg at 07/03/22 0749   risperiDONE (RISPERDAL M-TABS) disintegrating tablet 1 mg  1 mg Oral BID Starleen Blue, NP   1 mg at 07/03/22 0747   traZODone (DESYREL) tablet 50 mg  50 mg Oral QHS PRN Starleen Blue, NP        Lab Results: No results found for this or any previous visit (from the past 48 hour(s)).  Blood Alcohol level:  Lab Results  Component Value Date   ETH <10 05/18/2022    Metabolic Disorder Labs: No results found for: "HGBA1C", "MPG" Lab Results   Component Value Date   PROLACTIN 17.3 05/18/2022   Lab Results  Component Value Date   CHOL 126 03/15/2015   TRIG 71 03/15/2015   HDL 46 03/15/2015   CHOLHDL 2.7 03/15/2015   VLDL 14 03/15/2015   LDLCALC 66 03/15/2015    Physical Findings: AIMS:  , ,  ,  ,    CIWA:    COWS:     Musculoskeletal: Strength & Muscle Tone: within normal limits Gait & Station: normal Patient leans: N/A  Psychiatric Specialty Exam:  Presentation  General Appearance:  Appropriate for Environment; Fairly Groomed  Eye Contact: Fair  Speech: Clear and Coherent  Speech Volume: Decreased  Handedness: Right   Mood and Affect  Mood: Anxious; Irritable  Affect: Congruent   Thought Process  Thought Processes: Disorganized  Descriptions of Associations:Circumstantial  Orientation:Full (Time, Place and Person)  Thought Content:Illogical  History of Schizophrenia/Schizoaffective disorder:No  Duration of Psychotic Symptoms:N/A  Hallucinations:Hallucinations: Auditory Description of Auditory Hallucinations: God's voice  Ideas of Reference:Delusions; Paranoia; Percusatory  Suicidal Thoughts:Suicidal Thoughts: No  Homicidal Thoughts:Homicidal Thoughts: No   Sensorium  Memory: Immediate Good  Judgment: Poor  Insight: Poor   Executive Functions  Concentration: Poor  Attention Span: Poor  Recall: Poor  Fund of Knowledge: Poor  Language: Fair   Psychomotor Activity  Psychomotor Activity: Psychomotor Activity: Normal   Assets  Assets: Social Support   Sleep  Sleep: Sleep: Good    Physical Exam: Physical Exam Constitutional:      Appearance: Normal appearance.  HENT:     Head: Normocephalic.     Nose: Nose normal. No congestion or rhinorrhea.  Eyes:     Pupils: Pupils are equal, round, and reactive to light.  Pulmonary:     Effort: Pulmonary effort is normal.  Musculoskeletal:     Cervical back: Normal range of motion.   Neurological:     Mental Status: She is alert and oriented to person,  place, and time.    Review of Systems  Constitutional:  Negative for fever.  HENT:  Negative for hearing loss.   Eyes:  Negative for blurred vision.  Respiratory:  Positive for cough.   Cardiovascular:  Negative for chest pain.  Gastrointestinal:  Negative for heartburn.  Genitourinary:  Negative for dysuria.  Musculoskeletal:  Negative for myalgias.  Skin:  Negative for rash.  Neurological:  Negative for dizziness.  Psychiatric/Behavioral:  Positive for depression, hallucinations and substance abuse. Negative for memory loss and suicidal ideas. The patient is nervous/anxious and has insomnia.    Blood pressure 97/63, pulse 78, temperature 98.2 F (36.8 C), temperature source Oral, resp. rate 20, height 5\' 7"  (1.702 m), weight 59.4 kg, SpO2 98 %. Body mass index is 20.52 kg/m.  Treatment Plan Summary: Daily contact with patient to assess and evaluate symptoms and progress in treatment and Medication management   Safety and Monitoring: Voluntary admission to inpatient psychiatric unit for safety, stabilization and treatment Daily contact with patient to assess and evaluate symptoms and progress in treatment Patient's case to be discussed in multi-disciplinary team meeting Observation Level : q15 minute checks Vital signs: q12 hours Precautions: Safety   Long Term Goal(s): Improvement in symptoms so as ready for discharge   Short Term Goals: Ability to identify changes in lifestyle to reduce recurrence of condition will improve, Ability to verbalize feelings will improve, Ability to disclose and discuss suicidal ideas, Ability to demonstrate self-control will improve, Ability to identify and develop effective coping behaviors will improve, Ability to maintain clinical measurements within normal limits will improve, Compliance with prescribed medications will improve, and Ability to identify triggers associated  with substance abuse/mental health issues will improve   Diagnoses Principal Problem:   Schizoaffective disorder, bipolar type (HCC) Active Problems:   Delta-9-tetrahydrocannabinol (THC) dependence (HCC)   Insomnia   Tic disorder, unspecified   Medications -Reduce Lamictal from 100 mg daily to 25 mg BID for mood stabilization d/t risk of SJS with restarting at high dose.  -Continue Risperdal M tabs 1 mg twice daily for psychosis/mood stabilization   -Continue guanfacine send 1 mg daily for trouble concentrating   -Continue trazodone 50 mg nightly for sleep   -Start hydroxyzine 3 times daily as needed for anxiety   -Continue agitation protocol medications: Haldol/Benadryl/Ativan 3 times daily as needed every 6 hours   Other PRNS -Continue Tylenol 650 mg every 6 hours PRN for mild pain -Continue Maalox 30 mg every 4 hrs PRN for indigestion -Continue Milk of Magnesia as needed every 6 hrs for constipation   Labs reviewed: Orders placed for hemoglobin A1c, lipid panel, vitamin D, EKG within normal limits at 424.-Patient refused labs yesterday. We have reordered for tomorrow morning-Educated on the need to comply.   Discharge Planning: Social work and case management to assist with discharge planning and identification of hospital follow-up needs prior to discharge Estimated LOS: 5-7 days Discharge Concerns: Need to establish a safety plan; Medication compliance and effectiveness Discharge Goals: Return home with outpatient referrals for mental health follow-up including medication management/psychotherapy   I certify that inpatient services furnished can reasonably be expected to improve the patient's condition.    Starleen Blue, NP 07/03/2022, 2:37 PM

## 2022-07-03 NOTE — BHH Group Notes (Signed)
Spiritual care group on grief and loss facilitated by Chaplain Dyanne Carrel, Bcc  Group Goal: Support / Education around grief and loss  Members engage in facilitated group support and psycho-social education.  Group Description:  Following introductions and group rules, group members engaged in facilitated group dialogue and support around topic of loss, with particular support around experiences of loss in their lives. Group Identified types of loss (relationships / self / things) and identified patterns, circumstances, and changes that precipitate losses. Reflected on thoughts / feelings around loss, normalized grief responses, and recognized variety in grief experience. Group encouraged individual reflection on safe space and on the coping skills that they are already utilizing.  Group drew on Adlerian / Rogerian and narrative framework  Patient Progress: Jennifer York attended group and participated and engaged in group conversation and activities until called out to meet with provider.

## 2022-07-03 NOTE — Progress Notes (Signed)
   07/03/22 1054  Psych Admission Type (Psych Patients Only)  Admission Status Involuntary  Psychosocial Assessment  Patient Complaints None  Eye Contact Fair  Facial Expression Animated;Anxious  Affect Euphoric  Speech Logical/coherent  Interaction Assertive  Motor Activity Other (Comment) (WDL)  Appearance/Hygiene Unremarkable  Behavior Characteristics Cooperative;Fidgety  Mood Pleasant;Euphoric  Thought Process  Coherency WDL  Content WDL  Delusions Paranoid  Perception WDL  Hallucination None reported or observed  Judgment Impaired  Confusion None  Danger to Self  Current suicidal ideation? Denies  Agreement Not to Harm Self Yes  Description of Agreement verbal  Danger to Others  Danger to Others None reported or observed

## 2022-07-03 NOTE — Group Note (Deleted)
Recreation Therapy Group Note   Group Topic:Coping Skills  Group Date: 07/03/2022 Start Time: 1020 End Time: 1057 Facilitators: Temesha Queener-McCall, Burtis Imhoff A, NT Location: 500 Hall Dayroom       Affect/Mood: {RT BHH Affect/Mood:26271}   Participation Level: {RT BHH Participation [employer]   Participation Quality: {RT BHH Participation Quality:26268}   Behavior: {RT BHH Group Behavior:26269}   Speech/Thought Process: {RT BHH Speech/Thought:26276}   Insight: {RT BHH Insight:26272}   Judgement: {RT BHH Judgement:26278}   Modes of Intervention: {RT BHH Modes of Intervention:26277}   Patient Response to Interventions:  {RT BHH Patient Response to Intervention:26274}   Education Outcome:  {RT BHH Education Outcome:26279}   Clinical Observations/Individualized Feedback: *** was *** in their participation of session activities and group discussion. Pt identified ***   Plan: {RT BHH Tx Plan:26280}   Sayan Aldava A Kenyona Rena-McCall, NT,  07/03/2022 1:00 PM

## 2022-07-03 NOTE — BH IP Treatment Plan (Signed)
Interdisciplinary Treatment and Diagnostic Plan Update  07/03/2022 Time of Session: 11am Jennifer York MRN: [id number]  Principal Diagnosis: Schizoaffective disorder, bipolar type (HCC)  Secondary Diagnoses: Principal Problem:   Schizoaffective disorder, bipolar type (HCC) Active Problems:   Delta-9-tetrahydrocannabinol (THC) dependence (HCC)   Insomnia   Tic disorder, unspecified   Current Medications:  Current Facility-Administered Medications  Medication Dose Route Frequency Provider Last Rate Last Admin   acetaminophen (TYLENOL) tablet 650 mg  650 mg Oral Q6H PRN White, Patrice L, NP       alum & mag hydroxide-simeth (MAALOX/MYLANTA) 200-200-20 MG/5ML suspension 30 mL  30 mL Oral Q4H PRN White, Patrice L, NP       diphenhydrAMINE (BENADRYL) capsule 50 mg  50 mg Oral TID PRN White, Patrice L, NP       Or   diphenhydrAMINE (BENADRYL) injection 50 mg  50 mg Intramuscular TID PRN White, Patrice L, NP       feeding supplement (ENSURE ENLIVE / ENSURE PLUS) liquid 237 mL  237 mL Oral BID BM Rex Kras, MD   237 mL at 07/02/22 2129   guanFACINE (INTUNIV) ER tablet 1 mg  1 mg Oral Daily Starleen Blue, NP   1 mg at 07/03/22 0747   haloperidol (HALDOL) tablet 5 mg  5 mg Oral TID PRN White, Patrice L, NP       Or   haloperidol lactate (HALDOL) injection 5 mg  5 mg Intramuscular TID PRN White, Patrice L, NP       hydrOXYzine (ATARAX) tablet 25 mg  25 mg Oral TID PRN White, Patrice L, NP   25 mg at 07/01/22 2116   [START ON 07/04/2022] lamoTRIgine (LAMICTAL) tablet 25 mg  25 mg Oral Q12H Massengill, Nathan, MD       LORazepam (ATIVAN) tablet 2 mg  2 mg Oral TID PRN White, Patrice L, NP       Or   LORazepam (ATIVAN) injection 2 mg  2 mg Intramuscular TID PRN White, Patrice L, NP       magnesium hydroxide (MILK OF MAGNESIA) suspension 30 mL  30 mL Oral Daily PRN White, Patrice L, NP       nicotine polacrilex (NICORETTE) gum 2 mg  2 mg Oral PRN Rex Kras, MD   2 mg at 07/03/22 0749    risperiDONE (RISPERDAL M-TABS) disintegrating tablet 1 mg  1 mg Oral BID Starleen Blue, NP   1 mg at 07/03/22 0747   traZODone (DESYREL) tablet 50 mg  50 mg Oral QHS PRN Starleen Blue, NP       PTA Medications: Medications Prior to Admission  Medication Sig Dispense Refill Last Dose   lamoTRIgine (LAMICTAL) 100 MG tablet Take 100 mg by mouth daily.   Past Month   traZODone (DESYREL) 50 MG tablet Take 50 mg by mouth at bedtime as needed for sleep.   Past Month   OLANZapine (ZYPREXA) 5 MG tablet Take 1 tablet (5 mg total) by mouth at bedtime. (Patient not taking: Reported on 07/01/2022) 30 tablet 0 Not Taking   venlafaxine (EFFEXOR) 37.5 MG tablet Take 37.5 mg by mouth daily. (Patient not taking: Reported on 05/18/2022)   Not Taking    Patient Stressors: Financial difficulties   Marital or family conflict    Patient Strengths: Average or above average intelligence  Communication skills  General fund of knowledge  Physical Health   Treatment Modalities: Medication Management, Group therapy, Case management,  1 to 1 session with clinician, Psychoeducation, Recreational  therapy.   Physician Treatment Plan for Primary Diagnosis: Schizoaffective disorder, bipolar type (HCC) Long Term Goal(s): Improvement in symptoms so as ready for discharge   Short Term Goals: Ability to identify changes in lifestyle to reduce recurrence of condition will improve Ability to verbalize feelings will improve Ability to disclose and discuss suicidal ideas Ability to demonstrate self-control will improve Ability to identify and develop effective coping behaviors will improve Ability to maintain clinical measurements within normal limits will improve Compliance with prescribed medications will improve Ability to identify triggers associated with substance abuse/mental health issues will improve  Medication Management: Evaluate patient's response, side effects, and tolerance of medication  regimen.  Therapeutic Interventions: 1 to 1 sessions, Unit Group sessions and Medication administration.  Evaluation of Outcomes: Progressing  Physician Treatment Plan for Secondary Diagnosis: Principal Problem:   Schizoaffective disorder, bipolar type (HCC) Active Problems:   Delta-9-tetrahydrocannabinol (THC) dependence (HCC)   Insomnia   Tic disorder, unspecified  Long Term Goal(s): Improvement in symptoms so as ready for discharge   Short Term Goals: Ability to identify changes in lifestyle to reduce recurrence of condition will improve Ability to verbalize feelings will improve Ability to disclose and discuss suicidal ideas Ability to demonstrate self-control will improve Ability to identify and develop effective coping behaviors will improve Ability to maintain clinical measurements within normal limits will improve Compliance with prescribed medications will improve Ability to identify triggers associated with substance abuse/mental health issues will improve     Medication Management: Evaluate patient's response, side effects, and tolerance of medication regimen.  Therapeutic Interventions: 1 to 1 sessions, Unit Group sessions and Medication administration.  Evaluation of Outcomes: Progressing   RN Treatment Plan for Primary Diagnosis: Schizoaffective disorder, bipolar type (HCC) Long Term Goal(s): Knowledge of disease and therapeutic regimen to maintain health will improve  Short Term Goals: Ability to remain free from injury will improve, Ability to verbalize frustration and anger appropriately will improve, Ability to demonstrate self-control, Ability to participate in decision making will improve, Ability to verbalize feelings will improve, Ability to disclose and discuss suicidal ideas, Ability to identify and develop effective coping behaviors will improve, and Compliance with prescribed medications will improve  Medication Management: RN will administer medications  as ordered by provider, will assess and evaluate patient's response and provide education to patient for prescribed medication. RN will report any adverse and/or side effects to prescribing provider.  Therapeutic Interventions: 1 on 1 counseling sessions, Psychoeducation, Medication administration, Evaluate responses to treatment, Monitor vital signs and CBGs as ordered, Perform/monitor CIWA, COWS, AIMS and Fall Risk screenings as ordered, Perform wound care treatments as ordered.  Evaluation of Outcomes: Progressing   LCSW Treatment Plan for Primary Diagnosis: Schizoaffective disorder, bipolar type (HCC) Long Term Goal(s): Safe transition to appropriate next level of care at discharge, Engage patient in therapeutic group addressing interpersonal concerns.  Short Term Goals: Engage patient in aftercare planning with referrals and resources, Increase social support, Increase ability to appropriately verbalize feelings, Increase emotional regulation, Facilitate acceptance of mental health diagnosis and concerns, Facilitate patient progression through stages of change regarding substance use diagnoses and concerns, Identify triggers associated with mental health/substance abuse issues, and Increase skills for wellness and recovery  Therapeutic Interventions: Assess for all discharge needs, 1 to 1 time with Social worker, Explore available resources and support systems, Assess for adequacy in community support network, Educate family and significant other(s) on suicide prevention, Complete Psychosocial Assessment, Interpersonal group therapy.  Evaluation of Outcomes: Progressing   Progress  in Treatment: Attending groups: Yes. Participating in groups: Yes. Taking medication as prescribed: Yes. Toleration medication: Yes. Family/Significant other contact made: No, will contact:  Estill Bamberg or Neita Goodnight, patient states that the phone number is in the locker Patient understands diagnosis:  Yes. Discussing patient identified problems/goals with staff: Yes. Medical problems stabilized or resolved: Yes. Denies suicidal/homicidal ideation: Yes. Issues/concerns per patient self-inventory: No.   New problem(s) identified: No, Describe:  none reported   New Short Term/Long Term Goal(s):  medication stabilization, elimination of SI thoughts, development of comprehensive mental wellness plan.    Patient Goals:  Pt states, "I need to get a healthier mindset"  Discharge Plan or Barriers: Patient recently admitted. CSW will continue to follow and assess for appropriate referrals and possible discharge planning.    Reason for Continuation of Hospitalization: Anxiety Depression Medication stabilization Other; describe psychosis  Estimated Length of Stay: 5-7 days  Last 3 [place] Suicide Severity Risk Score: Flowsheet Row Ad[facility] (Current) [facility] from [facility]18/[facility] in [facility] from 04/11/2022 in [facility]  C-SSRS RISK CATEGORY No Risk No Risk No Risk       Last [facility] 2/9 Scores:     No data to display          Scribe for Treatment Team: Beatris Si, LCSW 07/03/2022 3:48 PM

## 2022-07-03 NOTE — Group Note (Signed)
Recreation Therapy Group Note   Group Topic:Healthy Decision Making  Group Date: 07/03/2022 Start Time: 0935 End Time: 1015 Facilitators: Korbyn Chopin-McCall, LRT,CTRS Location: [address] Dayroom   Goal Area(s) Addresses:  Patient will effectively work with peer towards shared goal.  Patient will identify factors that guided their decision making.  Patient will pro-socially communicate ideas during group session.   Group Descripiton:  Patients were given a scenario that they were going to be stranded on a deserted [place] for several months before being rescued. Writer tasked them with making a list of 15 things they would choose to bring with them for "survival". The list of items was prioritized most important to least. Each patient would come up with their own list, then work together to create a new list of 15 items while in a group of 3-5 peers. LRT discussed each person's list and how it differed from others. The debrief included discussion of priorities, good decisions versus bad decisions, and how it is important to think before acting so we can make the best decision possible. LRT tied the concept of effective communication among group members to patient's support systems outside of the hospital and its benefit post discharge.   Affect/Mood: Appropriate   Participation Level: Active   Participation Quality: Independent   Behavior: Appropriate   Speech/Thought Process: Focused   Insight: Good   Judgement: Good   Modes of Intervention: Group work   Patient Response to Interventions:  Attentive   Education Outcome:  Acknowledges education   Clinical Observations/Individualized Feedback: Pt was engaged and working well.  Pt was called out of group for awhile.  When pt returned, her group was wrapping up their list.  Pt didn't get the opportunity to add to it.     Plan: Continue to engage patient in RT group sessions 2-3x/week.   Sanyla Summey-McCall,  LRT,CTRS 07/03/2022 12:26 PM

## 2022-07-03 NOTE — Group Note (Signed)
Date:  07/03/2022 Time:  10:01 AM  Group Topic/Focus:  Goals Group:   The focus of this group is to help patients establish daily goals to achieve during treatment and discuss how the patient can incorporate goal setting into their daily lives to aide in recovery.    Participation Level:  Active  Participation Quality:  Appropriate  Affect:  Appropriate  Cognitive:  Alert  Insight: Appropriate  Engagement in Group:  Engaged  Modes of Intervention:  Discussion  Additional Comments:  Pt attended group and stated that her goal for today was stay positive.   Beckie Busing 07/03/2022, 10:01 AM

## 2022-07-03 NOTE — Progress Notes (Signed)
Pt refused morning labs.  

## 2022-07-04 DIAGNOSIS — F250 Schizoaffective disorder, bipolar type: Secondary | ICD-10-CM | POA: Diagnosis not present

## 2022-07-04 MED ORDER — RISPERIDONE 2 MG PO TBDP
2.0000 mg | ORAL_TABLET | Freq: Every day | ORAL | Status: DC
  Administered 2022-07-05 – 2022-07-10 (×6): 2 mg via ORAL
  Filled 2022-07-04 (×9): qty 1

## 2022-07-04 MED ORDER — RISPERIDONE 1 MG PO TBDP
1.0000 mg | ORAL_TABLET | Freq: Every day | ORAL | Status: DC
  Administered 2022-07-05 – 2022-07-11 (×7): 1 mg via ORAL
  Filled 2022-07-04 (×9): qty 1

## 2022-07-04 NOTE — Group Note (Signed)
Recreation Therapy Group Note   Group Topic:Problem Solving  Group Date: 07/04/2022 Start Time: 1038 End Time: 1115 Facilitators: Lucill Mauck-McCall, LRT,CTRS Location: [address] Dayroom   Goal Area(s) Addresses:  Patient will effectively work with peer towards shared goal.  Patient will identify skills used to make activity successful.  Patient will share challenges and verbalize solution-driven approaches used. Patient will identify how skills used during activity can be used to reach post d/c goals.   Group Description:  [employer]. Patients were provided the following materials: 2 drinking straws, 5 rubber bands, 5 paper clips, 2 index cards and 2 drinking cups. Using the provided materials patients were asked to build a launching mechanism to launch a ping pong ball across the room, approximately 10 feet. Patients were divided into teams of 3-5. Instructions required all materials be incorporated into the device, functionality of items left to the peer group's discretion.   Affect/Mood: Appropriate   Participation Level: Engaged   Participation Quality: Independent   Behavior: Appropriate   Speech/Thought Process: Focused   Insight: Good   Judgement: Good   Modes of Intervention: STEM Activity   Patient Response to Interventions:  Engaged   Education Outcome:  Acknowledges education   Clinical Observations/Individualized Feedback: Pt was engaged and worked well with peers in completing activity.  Pt was also determined and intent on being successful.  Pt was appropriate and focused during group session.     Plan: Continue to engage patient in RT group sessions 2-3x/week.   Shakyla Nolley-McCall, LRT,CTRS  07/04/2022 12:16 PM

## 2022-07-04 NOTE — Progress Notes (Signed)
Adult Psychoeducational Group Note  Date:  07/04/2022 Time:  10:37 AM  Group Topic/Focus:  Goals Group:   The focus of this group is to help patients establish daily goals to achieve during treatment and discuss how the patient can incorporate goal setting into their daily lives to aide in recovery. Orientation:   The focus of this group is to educate the patient on the purpose and policies of crisis stabilization and provide a format to answer questions about their admission.  The group details unit policies and expectations of patients while admitted.  Participation Level:  Did Not Attend  Participation Quality:   n/a  Affect:   n/a  Cognitive:   n/a  Insight: None  Engagement in Group:   n/a  Modes of Intervention:   n/a  Additional Comments:   Pt did not attend.  Jennifer York 07/04/2022, 10:37 AM

## 2022-07-04 NOTE — Progress Notes (Signed)
   07/04/22 0957  Psych Admission Type (Psych Patients Only)  Admission Status Involuntary  Psychosocial Assessment  Patient Complaints None  Eye Contact Fair  Facial Expression Anxious  Affect Euphoric;Preoccupied  Speech Logical/coherent  Interaction Assertive  Motor Activity Slow  Appearance/Hygiene Unremarkable  Behavior Characteristics Cooperative  Mood Euphoric  Thought Process  Coherency WDL  Content WDL  Delusions Paranoid  Perception WDL  Hallucination None reported or observed  Judgment Impaired  Confusion None  Danger to Self  Current suicidal ideation? Denies  Danger to Others  Danger to Others None reported or observed

## 2022-07-04 NOTE — Progress Notes (Signed)
Writer talked to pt about her refusing labs, pt stated she does not like getting stuck everyday. Writer informed pt to tell doctor to order all the test at one time, pt stated she would be willing to get the labs done tomorrow evening. Pt educated that she may have to get labs drawn a few days later as follow up and she appeared to understand.     07/04/22 2100  Psych Admission Type (Psych Patients Only)  Admission Status Involuntary  Psychosocial Assessment  Patient Complaints None  Eye Contact Fair  Facial Expression Anxious  Affect Euphoric;Preoccupied  Speech Logical/coherent  Interaction Assertive  Motor Activity Slow  Appearance/Hygiene Unremarkable  Aggressive Behavior  Effect No apparent injury  Thought Process  Coherency WDL  Content WDL  Delusions Paranoid  Perception WDL  Hallucination None reported or observed  Judgment Impaired  Confusion None  Danger to Self  Current suicidal ideation? Denies
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
## 2022-07-04 NOTE — Group Note (Unsigned)
Date:  07/06/2022 Time:  10:20 AM  Group Topic/Focus:  Goals Group:   The focus of this group is to help patients establish daily goals to achieve during treatment and discuss how the patient can incorporate goal setting into their daily lives to aide in recovery. Orientation:   The focus of this group is to educate the patient on the purpose and policies of crisis stabilization and provide a format to answer questions about their admission.  The group details unit policies and expectations of patients while admitted.    Participation Level:  Active  Participation Quality:  Appropriate and Attentive  Affect:  Appropriate  Cognitive:  Appropriate  Insight: Appropriate  Engagement in Group:  Engaged  Modes of Intervention:  Discussion  Additional Comments:  Patient attended goal group and was attentive the duration of the group. Patient's goal was to be more active during the day.   Mikayela Deats T Shannie Kontos 07/06/2022, 10:20 AM  

## 2022-07-04 NOTE — Progress Notes (Signed)
   07/04/22 0546  15 Minute Checks  Location Bedroom  Visual Appearance Calm  Behavior Sleeping  Sleep (Behavioral Health Patients Only)  Calculate sleep? (Click Yes once per 24 hr at 0600 safety check) Yes  Documented sleep last 24 hours 7.25

## 2022-07-05 DIAGNOSIS — F250 Schizoaffective disorder, bipolar type: Secondary | ICD-10-CM | POA: Diagnosis not present

## 2022-07-05 LAB — LIPID PANEL
Cholesterol: 131 mg/dL (ref 0–200)
HDL: 56 mg/dL (ref >40)
LDL Cholesterol: 67 mg/dL (ref 0–99)
Total CHOL/HDL Ratio: 2.3 RATIO
Triglycerides: 40 mg/dL (ref <150)
VLDL: 8 mg/dL (ref 0–40)

## 2022-07-05 LAB — VITAMIN D 25 HYDROXY (VIT D DEFICIENCY, FRACTURES): Vit D, 25-Hydroxy: 28.3 ng/mL — ABNORMAL LOW (ref 30–100)

## 2022-07-05 LAB — HEMOGLOBIN A1C
Hgb A1c MFr Bld: 4.8 % (ref 4.8–5.6)
Mean Plasma Glucose: 91.06 mg/dL

## 2022-07-05 MED ORDER — VITAMIN D3 25 MCG PO TABS
1000.0000 [IU] | ORAL_TABLET | Freq: Every day | ORAL | Status: DC
  Administered 2022-07-05 – 2022-07-11 (×7): 1000 [IU] via ORAL
  Filled 2022-07-05 (×11): qty 1

## 2022-07-05 NOTE — Progress Notes (Signed)
   07/05/22 0547  15 Minute Checks  Location Bedroom  Visual Appearance Calm  Behavior Sleeping  Sleep (Behavioral Health Patients Only)  Calculate sleep? (Click Yes once per 24 hr at 0600 safety check) Yes  Documented sleep last 24 hours 8.5

## 2022-07-05 NOTE — Plan of Care (Signed)
  Problem: Education: Goal: Knowledge of [facility] Education information/materials will improve Outcome: Progressing Goal: Emotional status will improve Outcome: Progressing Goal: Mental status will improve Outcome: Progressing Goal: Verbalization of understanding the information provided will improve Outcome: Progressing   Problem: Activity: Goal: Interest or engagement in activities will improve Outcome: Progressing Goal: Sleeping patterns will improve Outcome: Progressing   

## 2022-07-05 NOTE — Progress Notes (Signed)
   07/05/22 2115  Psych Admission Type (Psych Patients Only)  Admission Status Involuntary  Psychosocial Assessment  Patient Complaints None  Eye Contact Fair  Facial Expression Anxious  Affect Euphoric;Preoccupied  Speech Logical/coherent  Interaction Assertive  Motor Activity Slow  Appearance/Hygiene Unremarkable  Behavior Characteristics Cooperative  Mood Anxious  Aggressive Behavior  Effect No apparent injury  Thought Process  Coherency WDL  Content WDL  Delusions Paranoid  Perception WDL  Hallucination None reported or observed  Judgment Impaired  Confusion None  Danger to Self  Current suicidal ideation? Denies

## 2022-07-05 NOTE — Progress Notes (Signed)
   07/05/22 0800  Psych Admission Type (Psych Patients Only)  Admission Status Involuntary  Psychosocial Assessment  Patient Complaints None  Eye Contact Fair  Facial Expression Anxious  Affect Anxious  Speech Logical/coherent  Interaction Assertive  Motor Activity Slow  Appearance/Hygiene Unremarkable  Behavior Characteristics Cooperative  Mood Euphoric  Aggressive Behavior  Effect No apparent injury  Thought Process  Coherency WDL  Content WDL  Delusions Paranoid  Perception WDL  Hallucination None reported or observed  Judgment Impaired  Confusion None  Danger to Self  Current suicidal ideation? Denies  Agreement Not to Harm Self Yes  Description of Agreement Verbal  Danger to Others  Danger to Others None reported or observed

## 2022-07-05 NOTE — Progress Notes (Signed)
Pt got her labs done this morning

## 2022-07-05 NOTE — Group Note (Signed)
Date:  07/05/2022 Time:  8:59 PM  Group Topic/Focus:  Wrap-Up Group:   The focus of this group is to help patients review their daily goal of treatment and discuss progress on daily workbooks.    Participation Level:  Active  Participation Quality:  Appropriate  Affect:  Appropriate  Cognitive:  Appropriate  Insight: Appropriate  Engagement in Group:  Engaged  Modes of Intervention:  Education and Exploration  Additional Comments:  Patient attended and participated in group tonight. She reports that the day was alright, she has been talking with peers.  Lita Mains [facility] 07/05/2022, 8:59 PM
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
## 2022-07-05 NOTE — Group Note (Signed)
Recreation Therapy Group Note   Group Topic:Self-Esteem  Group Date: 07/05/2022 Start Time: 1005 End Time: 1050 Facilitators: Caia Lofaro-McCall, LRT,CTRS Location: [address] Dayroom   Goal Area(s) Addresses:  Patient will successfully identify positive attributes about themselves.   Patient will successfully identify how self esteem in important.     Group Description: Patient asked to create a personalized collage on their construction paper color of choice. Patients were told to make the collage relative to self esteem; any thing that highlights positive things about them is allowed to be on the paper. Patients were given multiple craft materials to complete this activity. Patients were told to hang the collage in their room to help them acknowledge their positives when they are not feeling positive about themselves.   Affect/Mood: Appropriate   Participation Level: Engaged   Participation Quality: Independent   Behavior: Appropriate   Speech/Thought Process: Focused   Insight: Good   Judgement: Good   Modes of Intervention: Art   Patient Response to Interventions:  Engaged   Education Outcome:  Acknowledges education   Clinical Observations/Individualized Feedback: Pt was focused and engaged during group.  Pt collage was abstract art.  Pt described the collage as bright colors contrast against night time scene.  Pt expressed this scene represented balance.    Plan: Continue to engage patient in RT group sessions 2-3x/week.   Zyler Hyson-McCall, LRT,CTRS 07/05/2022 1:04 PM
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
## 2022-07-06 DIAGNOSIS — F250 Schizoaffective disorder, bipolar type: Secondary | ICD-10-CM | POA: Diagnosis not present

## 2022-07-06 NOTE — Progress Notes (Signed)
   07/06/22 2015  Psych Admission Type (Psych Patients Only)  Admission Status Involuntary  Psychosocial Assessment  Patient Complaints None  Eye Contact Fair  Facial Expression Anxious  Affect Euphoric;Preoccupied  Speech Logical/coherent  Interaction Assertive  Motor Activity Slow  Appearance/Hygiene Unremarkable  Behavior Characteristics Cooperative  Mood Anxious  Aggressive Behavior  Effect No apparent injury  Thought Process  Coherency WDL  Content WDL  Delusions Paranoid  Perception WDL  Hallucination None reported or observed  Judgment Impaired  Confusion None  Danger to Self  Current suicidal ideation? Denies  Danger to Others  Danger to Others None reported or observed

## 2022-07-06 NOTE — [facility] Group Notes (Signed)
[facility] Group Notes:  (Nursing/MHT/Case Management/Adjunct)  Date:  07/06/2022  Time:  9:17 PM  Type of Therapy:  Group Therapy  Participation Level:  Did Not Attend  Participation Quality:  did not attend  Affect:  did not attend  Cognitive:   did not attend  Insight:  None  Engagement in Group:  None  Modes of Intervention:  Activity, Education, and Problem-solving  Summary of Progress/Problems:  Jennifer York 07/06/2022, 9:17 PMBHH Group Notes:  (Nursing/MHT/Case Management/Adjunct)  Date:  07/06/2022  Time:  9:17 PM  Type of Therapy:  Group Therapy  Participation Level:  Did Not Attend  Participation Quality:  none  Affect:  none  Cognitive:  did not attend  Insight:  None  Engagement in Group:  None  Modes of Intervention:  Activity, Education, and Problem-solving  Summary of Progress/Problems:  Jennifer York 07/06/2022, 9:17 PM

## 2022-07-06 NOTE — Progress Notes (Signed)
[facility] MD Progress Note  07/06/2022 4:50 PM Jennifer York  MRN:  [id number]  Principal Problem: Schizoaffective disorder, bipolar type (HCC) Diagnosis: Principal Problem:   Schizoaffective disorder, bipolar type (HCC) Active Problems:   Delta-9-tetrahydrocannabinol (THC) dependence (HCC)   Insomnia   Tic disorder, unspecified  Reason For Admission: Pt is a 32 y.o. female presenting to [facility] under IVC from [facility] petitioned by her mother Rhys Secore) and IVC paperwork states that "Respondent has been diagnosed with major depressive disorder, psychosis, and an altered mental status. She refused to take her prescribed medication and extremely combative towards family members. She has threatened to kill herself, mother, father, and brother. Her mother had to hide all sharp objects in the home after she found her rubbing a knife up and down her arm. She is not sleeping nor eating and is unable to care for herself. Respondent is in need of a mental health evaluation."  Patient was involuntarily transferred to this [facility] on 07/01/22 for treatment and stabilization of her mental status.  Per nursing, patient is still very hyperreligious, euphoric, preoccupied Per nursing, the patient did refuse medications this morning, but later did take them on third offer.  On my exam today, patient is lying in her bed.  She reports the medication is making her tired but is willing to continue taking medication as "it is making my thoughts not as rapid.  I feel like I can concentrate better.  I actually feel less volatile".  She does report continuing to get special messages from different things around her room and outside of her room, mostly religious in nature.  Denies any hallucinations.  Reports that anxiety is less.  Reports mood is less overwhelmed and not sad.  Reports sleep is better.  Appetite is okay.  Reports that concentration is better denies any SI or HI.  She is willing to continue medications at  current doses.  Patient denies that she refused medications this morning, as per nursing report out.    Total Time spent with patient: 15 minutes  Past Psychiatric History: See H & P  Past Medical History:  Past Medical History:  Diagnosis Date   Family history of adverse reaction to anesthesia    father has woken up during surgery   Medical history non-contributory     Past Surgical History:  Procedure Laterality Date   DILATION AND EVACUATION N/A 12/24/2019   Procedure: SUCTION DILATION AND EVACUATION;  Surgeon: Reva Bores, MD;  Location: [facility];  Service: Gynecology;  Laterality: N/A;   Family History:  Family History  Problem Relation Age of Onset   Depression Mother    Depression Father    Depression Brother    Family Psychiatric  History: See H & P Social History:  Social History   Substance and Sexual Activity  Alcohol Use Yes   Comment: drinks 'occasionally'     Social History   Substance and Sexual Activity  Drug Use Yes   Types: Marijuana   Comment: smokes weed 'occasionally'    Social History   Socioeconomic History   Marital status: Single    Spouse name: Not on file   Number of children: Not on file   Years of education: Not on file   Highest education level: Not on file  Occupational History   Not on file  Tobacco Use   Smoking status: Every Day    Packs/day: 1    Types: Cigarettes   Smokeless tobacco: Current  Types: Chew  Vaping Use   Vaping Use: Every day   Substances: Nicotine  Substance and Sexual Activity   Alcohol use: Yes    Comment: drinks 'occasionally'   Drug use: Yes    Types: Marijuana    Comment: smokes weed 'occasionally'   Sexual activity: Yes    Birth control/protection: None  Other Topics Concern   Not on file  Social History Narrative   Not on file   Social Determinants of Health   Financial Resource Strain: Not on file  Food Insecurity: Patient Declined (07/01/2022)   Hunger Vital  Sign    Worried About Running Out of Food in the Last Year: Patient declined    Ran Out of Food in the Last Year: Patient declined  Transportation Needs: Patient Declined (07/01/2022)   PRAPARE - Administrator, Civil Service (Medical): Patient declined    Lack of Transportation (Non-Medical): Patient declined  Physical Activity: Not on file  Stress: Not on file  Social Connections: Not on file     Current Medications: Current Facility-Administered Medications  Medication Dose Route Frequency Provider Last Rate Last Admin   acetaminophen (TYLENOL) tablet 650 mg  650 mg Oral Q6H PRN White, Patrice L, NP   650 mg at 07/03/22 2136   alum & mag hydroxide-simeth (MAALOX/MYLANTA) 200-200-20 MG/5ML suspension 30 mL  30 mL Oral Q4H PRN White, Patrice L, NP       diphenhydrAMINE (BENADRYL) capsule 50 mg  50 mg Oral TID PRN White, Patrice L, NP       Or   diphenhydrAMINE (BENADRYL) injection 50 mg  50 mg Intramuscular TID PRN White, Patrice L, NP       feeding supplement (ENSURE ENLIVE / ENSURE PLUS) liquid 237 mL  237 mL Oral BID BM Rex Kras, MD   237 mL at 07/06/22 1645   guanFACINE (INTUNIV) ER tablet 1 mg  1 mg Oral Daily Starleen Blue, NP   1 mg at 07/06/22 1610   haloperidol (HALDOL) tablet 5 mg  5 mg Oral TID PRN White, Patrice L, NP       Or   haloperidol lactate (HALDOL) injection 5 mg  5 mg Intramuscular TID PRN White, Patrice L, NP       hydrOXYzine (ATARAX) tablet 25 mg  25 mg Oral TID PRN White, Patrice L, NP   25 mg at 07/04/22 2049   lamoTRIgine (LAMICTAL) tablet 25 mg  25 mg Oral Q12H Jathen Sudano, Harrold Donath, MD   25 mg at 07/06/22 0837   LORazepam (ATIVAN) tablet 2 mg  2 mg Oral TID PRN White, Patrice L, NP       Or   LORazepam (ATIVAN) injection 2 mg  2 mg Intramuscular TID PRN White, Patrice L, NP       magnesium hydroxide (MILK OF MAGNESIA) suspension 30 mL  30 mL Oral Daily PRN White, Patrice L, NP       nicotine polacrilex (NICORETTE) gum 2 mg  2 mg Oral PRN  Rex Kras, MD   2 mg at 07/06/22 1326   risperiDONE (RISPERDAL M-TABS) disintegrating tablet 1 mg  1 mg Oral Daily Kaseem Vastine, MD   1 mg at 07/06/22 0837   risperiDONE (RISPERDAL M-TABS) disintegrating tablet 2 mg  2 mg Oral QHS Ilma Achee, MD   2 mg at 07/05/22 2050   traZODone (DESYREL) tablet 50 mg  50 mg Oral QHS PRN Starleen Blue, NP   50 mg at 07/05/22 2050  vitamin D3 (CHOLECALCIFEROL) tablet 1,000 Units  1,000 Units Oral Daily Starleen Blue, NP   1,000 Units at 07/06/22 1610    Lab Results:  Results for orders placed or performed during the hospital encounter of 07/01/22 (from the past 48 hour(s))  Lipid panel     Status: None   Collection Time: 07/05/22  6:41 AM  Result Value Ref Range   Cholesterol 131 0 - 200 mg/dL   Triglycerides 40 <960 mg/dL   HDL 56 >45 mg/dL   Total CHOL/HDL Ratio 2.3 RATIO   VLDL 8 0 - 40 mg/dL   LDL Cholesterol 67 0 - 99 mg/dL    Comment:        Total Cholesterol/HDL:CHD Risk Coronary Heart Disease Risk Table                     Men   Women  1/2 Average Risk   3.4   3.3  Average Risk       5.0   4.4  2 X Average Risk   9.6   7.1  3 X Average Risk  23.4   11.0        Use the calculated Patient Ratio above and the CHD Risk Table to determine the patient's CHD Risk.        ATP III CLASSIFICATION (LDL):  <100     mg/dL   Optimal  409-811  mg/dL   Near or Above                    Optimal  130-159  mg/dL   Borderline  914-782  mg/dL   High  >956     mg/dL   Very High Performed at [facility], [address], [place], [place] [address]   VITAMIN D 25 Hydroxy (Vit-D Deficiency, Fractures)     Status: Abnormal   Collection Time: 07/05/22  6:41 AM  Result Value Ref Range   Vit D, 25-Hydroxy 28.30 (L) 30 - 100 ng/mL    Comment: (NOTE) Vitamin D deficiency has been defined by the Institute of Medicine  and an Endocrine Society practice guideline as a level of serum 25-OH  vitamin D less than 20 ng/mL  (1,2). The Endocrine Society went on to  further define vitamin D insufficiency as a level between 21 and 29  ng/mL (2).  1. IOM (Institute of Medicine). 2010. Dietary reference intakes for  calcium and D. [place]: [employer]. 2. Holick MF, Binkley Bonanza, Bischoff-Ferrari HA, et al. Evaluation,  treatment, and prevention of vitamin D deficiency: an Endocrine  Society clinical practice guideline, JCEM. 2011 Jul; 96(7): 1911-30.  Performed at [facility], [address], [place], [place] [address]   Hemoglobin A1c     Status: None   Collection Time: 07/05/22  6:41 AM  Result Value Ref Range   Hgb A1c MFr Bld 4.8 4.8 - 5.6 %    Comment: (NOTE) Pre diabetes:          5.7%-6.4%  Diabetes:              >6.4%  Glycemic control for   <7.0% adults with diabetes    Mean Plasma Glucose 91.06 mg/dL    Comment: Performed at [facility], [address], [place], [place] [address]    Blood Alcohol level:  Lab Results  Component Value Date   [facility] <10 05/18/2022    Metabolic Disorder Labs: Lab  Results  Component Value Date   HGBA1C 4.8 07/05/2022   MPG 91.06 07/05/2022   Lab Results  Component Value Date   PROLACTIN 17.3 05/18/2022   Lab Results  Component Value Date   CHOL 131 07/05/2022   TRIG 40 07/05/2022   HDL 56 07/05/2022   CHOLHDL 2.3 07/05/2022   VLDL 8 07/05/2022   LDLCALC 67 07/05/2022   LDLCALC 66 03/15/2015    Physical Findings: AIMS:  , ,  ,  ,    CIWA:    COWS:     Musculoskeletal: Strength & Muscle Tone: within normal limits Gait & Station: normal Patient leans: N/A  Psychiatric Specialty Exam:  Presentation  General Appearance:  Appropriate for Environment; Fairly Groomed  Eye Contact: Fair  Speech: Clear and Coherent  Speech Volume: Normal  Handedness: Right   Mood and Affect  Mood: Less overwhelmed  Affect: Congruent   Thought Process  Thought Processes: Coherent  Descriptions of  Associations:Intact  Orientation:Full (Time, Place and Person)  Thought Content:Illogical  History of Schizophrenia/Schizoaffective disorder:No  Duration of Psychotic Symptoms:N/A  Hallucinations:Hallucinations: None  Ideas of Reference:Paranoia; Delusions; Percusatory  Suicidal Thoughts:Suicidal Thoughts: No  Homicidal Thoughts:Homicidal Thoughts: No   Sensorium  Memory: Immediate Good  Judgment: Poor  Insight: Poor   Executive Functions  Concentration: Fair  Attention Span: Fair  Recall: Fair  Fund of Knowledge: Fair  Language: Fair   Psychomotor Activity  Psychomotor Activity: Psychomotor Activity: Normal   Assets  Assets: Resilience; Social Support   Sleep  Sleep: Sleep: Good    Physical Exam: Physical Exam Constitutional:      Appearance: Normal appearance.  HENT:     Head: Normocephalic.     Nose: Nose normal. No congestion or rhinorrhea.  Pulmonary:     Effort: Pulmonary effort is normal.  Musculoskeletal:     Cervical back: Normal range of motion.  Neurological:     Mental Status: She is alert and oriented to person, place, and time.  Psychiatric:        Behavior: Behavior normal.    Review of Systems  Constitutional:  Negative for fever.  HENT:  Negative for hearing loss.   Eyes:  Negative for blurred vision.  Respiratory:  Positive for cough.   Cardiovascular:  Negative for chest pain.  Gastrointestinal:  Negative for heartburn.  Genitourinary:  Negative for dysuria.  Musculoskeletal:  Negative for myalgias.  Skin:  Negative for rash.  Neurological:  Negative for dizziness.  Psychiatric/Behavioral:  Positive for depression and substance abuse. Negative for hallucinations, memory loss and suicidal ideas. The patient is nervous/anxious. The patient does not have insomnia.    Blood pressure 105/66, pulse 75, temperature 98 F (36.7 C), temperature source Oral, resp. rate 20, height 5\' 7"  (1.702 m), weight 59.4 kg,  SpO2 99 %. Body mass index is 20.52 kg/m.  Treatment Plan Summary: Daily contact with patient to assess and evaluate symptoms and progress in treatment and Medication management  Assessment: Diagnoses Principal Problem:   Schizoaffective disorder, bipolar type (HCC)   Delta-9-tetrahydrocannabinol (THC) dependence (HCC)   Insomnia   Tic disorder, unspecified   Plan: Safety and Monitoring: Voluntary admission to inpatient psychiatric unit for safety, stabilization and treatment Daily contact with patient to assess and evaluate symptoms and progress in treatment Patient's case to be discussed in multi-disciplinary team meeting Observation Level : q15 minute checks Vital signs: q12 hours Precautions: Safety    Medications: -Continue Lamictal 25 mg BID for mood stabilization d/t risk of  SJS with restarting at high dose.   -Continue Risperdal 1 mg every morning and 2 mg nightly -  for psychosis/mood stabilization   -Continue guanfacine send 1 mg daily for trouble concentrating   -Continue trazodone 50 mg nightly for sleep   -Continue hydroxyzine 3 times daily as needed for anxiety   -Continue agitation protocol medications: Haldol/Benadryl/Ativan 3 times daily as needed every 6 hours   Other PRNS -Continue Tylenol 650 mg every 6 hours PRN for mild pain -Continue Maalox 30 mg every 4 hrs PRN for indigestion -Continue Milk of Magnesia as needed every 6 hrs for constipation   Labs reviewed: Vitamin D slightly low, lipid panel, and hemoglobin A1c WNL. Started on Vitamin D daily for supplementation.  Discharge Planning: Social work and case management to assist with discharge planning and identification of hospital follow-up needs prior to discharge Estimated LOS: 5-7 days Discharge Concerns: Need to establish a safety plan; Medication compliance and effectiveness Discharge Goals: Return home with outpatient referrals for mental health follow-up including medication  management/psychotherapy   I certify that inpatient services furnished can reasonably be expected to improve the patient's condition.    Cristy Hilts, MD 07/06/2022, 4:50 PM  Total Time Spent in Direct Patient Care:  I personally spent 30 minutes on the unit in direct patient care. The direct patient care time included face-to-face time with the patient, reviewing the patient's chart, communicating with other professionals, and coordinating care. Greater than 50% of this time was spent in counseling or coordinating care with the patient regarding goals of hospitalization, psycho-education, and discharge planning needs.   Phineas Inches, MD Psychiatrist

## 2022-07-06 NOTE — Progress Notes (Signed)
   07/06/22 1600  Psych Admission Type (Psych Patients Only)  Admission Status Involuntary  Psychosocial Assessment  Patient Complaints None  Eye Contact Fair  Facial Expression Anxious  Affect Preoccupied  Speech Logical/coherent  Interaction Assertive  Motor Activity Slow  Appearance/Hygiene Unremarkable  Behavior Characteristics Cooperative  Mood Anxious  Thought Process  Coherency WDL  Content WDL  Delusions Paranoid  Perception WDL  Hallucination None reported or observed  Judgment Impaired  Confusion None  Danger to Self  Current suicidal ideation? Denies  Danger to Others  Danger to Others None reported or observed

## 2022-07-06 NOTE — Progress Notes (Signed)
   07/06/22 2115  Psych Admission Type (Psych Patients Only)  Admission Status Involuntary  Psychosocial Assessment  Patient Complaints None  Eye Contact Fair  Facial Expression Anxious  Affect Euphoric;Preoccupied  Speech Logical/coherent  Interaction Assertive  Motor Activity Slow  Appearance/Hygiene Unremarkable  Behavior Characteristics Cooperative  Mood Anxious  Aggressive Behavior  Effect No apparent injury  Thought Process  Coherency WDL  Content WDL  Delusions Paranoid  Perception WDL  Hallucination None reported or observed  Judgment Impaired  Confusion None  Danger to Self  Current suicidal ideation? Denies  Danger to Others  Danger to Others None reported or observed

## 2022-07-06 NOTE — Group Note (Signed)
Occupational Therapy Group Note  Group Topic: Sleep Hygiene  Group Date: 07/06/2022 Start Time: 1430 End Time: 1500 Facilitators: Aylen Stradford G, OT   Group Description: Group encouraged increased participation and engagement through topic focused on sleep hygiene. Patients reflected on the quality of sleep they typically receive and identified areas that need improvement. Group was given background information on sleep and sleep hygiene, including common sleep disorders. Group members also received information on how to improve one's sleep and introduced a sleep diary as a tool that can be utilized to track sleep quality over a length of time. Group session ended with patients identifying one or more strategies they could utilize or implement into their sleep routine in order to improve overall sleep quality.        Therapeutic Goal(s):  Identify one or more strategies to improve overall sleep hygiene  Identify one or more areas of sleep that are negatively impacted (sleep too much, too little, etc)     Participation Level: Engaged   Participation Quality: Independent   Behavior: Appropriate   Speech/Thought Process: Relevant   Affect/Mood: Appropriate   Insight: Fair   Judgement: Fair      Modes of Intervention: Education  Patient Response to Interventions:  Attentive   Plan: Continue to engage patient in OT groups 2 - 3x/week.  07/06/2022  Ieisha Gao G Bernardo Brayman, OT   Elye Harmsen, OT  

## 2022-07-06 NOTE — B[facility] Group Notes (Signed)
Adult Psychoeducational Group Note  Date:  07/06/2022 Time:  3:34 PM  Group Topic/Focus:  Goals Group:   The focus of this group is to help patients establish daily goals to achieve during treatment and discuss how the patient can incorporate goal setting into their daily lives to aide in recovery. Orientation:   The focus of this group is to educate the patient on the purpose and policies of crisis stabilization and provide a format to answer questions about their admission.  The group details unit policies and expectations of patients while admitted.  Participation Level:  Did Not Attend  Participation Quality:    Affect:    Cognitive:    Insight:   Engagement in Group:    Modes of Intervention:    Additional Comments:    Sheran Lawless 07/06/2022, 3:34 PM

## 2022-07-06 NOTE — Group Note (Signed)
Recreation Therapy Group Note   Group Topic:Leisure Education  Group Date: 07/06/2022 Start Time: 1003 End Time: 1045 Facilitators: Nollie Terlizzi-McCall, LRT,CTRS Location: [address] Day[address]   Goal Area(s) Addresses:  Patient will identify positive leisure activities for use post discharge. Patient will identify at least one positive benefit of participation in leisure activities.   Group Description: [employer]. Patients were placed in a medium size circle. Patients were to hit the beach ball, as if playing volleyball, to each other. LRT will keep time while patients keep the ball going for as long as possible without it coming to a complete stop. When the ball comes to a stop, the time will start over. LRT will also play music in the background with a summer feel.   Affect/Mood: N/A   Participation Level: Did not attend    Clinical Observations/Individualized Feedback:     Plan: Continue to engage patient in RT group sessions 2-3x/week.   Tyson Masin-McCall, LRT,CTRS  07/06/2022 11:45 AM

## 2022-07-07 ENCOUNTER — Encounter (HOSPITAL_COMMUNITY): Payer: Self-pay

## 2022-07-07 DIAGNOSIS — F250 Schizoaffective disorder, bipolar type: Secondary | ICD-10-CM | POA: Diagnosis not present

## 2022-07-07 MED ORDER — IBUPROFEN 400 MG PO TABS: 400.0000 mg | ORAL_TABLET | Freq: Four times a day (QID) | ORAL | Status: DC | PRN

## 2022-07-07 NOTE — Progress Notes (Signed)
   07/07/22 1200  Psych Admission Type (Psych Patients Only)  Admission Status Involuntary  Psychosocial Assessment  Patient Complaints None  Eye Contact Fair  Facial Expression Anxious  Affect Preoccupied  Speech Logical/coherent  Interaction Assertive  Motor Activity Slow  Appearance/Hygiene Unremarkable  Behavior Characteristics Appropriate to situation  Mood Anxious  Thought Process  Coherency WDL  Content WDL  Delusions Paranoid  Perception WDL  Hallucination None reported or observed  Judgment Impaired  Confusion None  Danger to Self  Current suicidal ideation? Denies  Danger to Others  Danger to Others None reported or observed

## 2022-07-07 NOTE — [facility] Group Notes (Signed)
Adult Psychoeducational Group Note  Date:  07/07/2022 Time:  8:28 PM  Group Topic/Focus:  Wrap-Up Group:   The focus of this group is to help patients review their daily goal of treatment and discuss progress on daily workbooks.  Participation Level:  Active  Participation Quality:  Appropriate  Affect:  Appropriate  Cognitive:  Appropriate  Insight: Appropriate  Engagement in Group:  Engaged  Modes of Intervention:  Discussion  Additional Comments:   Pt states that her day was "alright". Pt states that, during group, she had a beneficial conversation about how she should start taking time for herself. Pt listed that as one of her D/C goals and states that she's working on being more receptive to asking for help. Pt expressed some issues with inflammation and requested Ibuprofen. Pt encouraged to speak with her nurse. Pt denied everything  Jennifer York 07/07/2022, 8:28 PM

## 2022-07-07 NOTE — B[facility] Counselor (Signed)
[facility]/BMU LCSW Progress Note   07/07/2022    11:02 AM  Jennifer York   [id number]   Type of Contact and Topic:  Collateral Contact and Permission  CSW spoke to patient about ex boyfriend, [name], [phone], and if we can have permission to speak with him.  Patient declined consent to speak to him.   CSW discussed patient discharge.  Patient states that she does not know where she is going.  CSW provided patient with homeless shelter packet information and resources. Pt agreed to let CSW know when she has a plan so that we can get appropriate appointments.     Signed:  Anson [place] MSW, LCSW, LCAS 07/07/2022 11:02 AM

## 2022-07-07 NOTE — BH IP Treatment Plan (Signed)
Interdisciplinary Treatment and Diagnostic Plan Update  07/07/2022 Time of Session: 1030 Jennifer York MRN: [id number]  Principal Diagnosis: Schizoaffective disorder, bipolar type (HCC)  Secondary Diagnoses: Principal Problem:   Schizoaffective disorder, bipolar type (HCC) Active Problems:   Delta-9-tetrahydrocannabinol (THC) dependence (HCC)   Insomnia   Tic disorder, unspecified   Current Medications:  Current Facility-Administered Medications  Medication Dose Route Frequency Provider Last Rate Last Admin   acetaminophen (TYLENOL) tablet 650 mg  650 mg Oral Q6H PRN White, Patrice L, NP   650 mg at 07/03/22 2136   alum & mag hydroxide-simeth (MAALOX/MYLANTA) 200-200-20 MG/5ML suspension 30 mL  30 mL Oral Q4H PRN White, Patrice L, NP       diphenhydrAMINE (BENADRYL) capsule 50 mg  50 mg Oral TID PRN White, Patrice L, NP       Or   diphenhydrAMINE (BENADRYL) injection 50 mg  50 mg Intramuscular TID PRN White, Patrice L, NP       feeding supplement (ENSURE ENLIVE / ENSURE PLUS) liquid 237 mL  237 mL Oral BID BM Rex Kras, MD   237 mL at 07/07/22 0921   guanFACINE (INTUNIV) ER tablet 1 mg  1 mg Oral Daily Starleen Blue, NP   1 mg at 07/07/22 0844   haloperidol (HALDOL) tablet 5 mg  5 mg Oral TID PRN White, Patrice L, NP       Or   haloperidol lactate (HALDOL) injection 5 mg  5 mg Intramuscular TID PRN White, Patrice L, NP       hydrOXYzine (ATARAX) tablet 25 mg  25 mg Oral TID PRN White, Patrice L, NP   25 mg at 07/04/22 2049   lamoTRIgine (LAMICTAL) tablet 25 mg  25 mg Oral Q12H Massengill, Harrold Donath, MD   25 mg at 07/07/22 0844   LORazepam (ATIVAN) tablet 2 mg  2 mg Oral TID PRN White, Patrice L, NP       Or   LORazepam (ATIVAN) injection 2 mg  2 mg Intramuscular TID PRN White, Patrice L, NP       magnesium hydroxide (MILK OF MAGNESIA) suspension 30 mL  30 mL Oral Daily PRN White, Patrice L, NP       nicotine polacrilex (NICORETTE) gum 2 mg  2 mg Oral PRN Rex Kras, MD    2 mg at 07/07/22 6578   risperiDONE (RISPERDAL M-TABS) disintegrating tablet 1 mg  1 mg Oral Daily Massengill, Nathan, MD   1 mg at 07/07/22 0844   risperiDONE (RISPERDAL M-TABS) disintegrating tablet 2 mg  2 mg Oral QHS Massengill, Nathan, MD   2 mg at 07/06/22 2127   traZODone (DESYREL) tablet 50 mg  50 mg Oral QHS PRN Starleen Blue, NP   50 mg at 07/05/22 2050   vitamin D3 (CHOLECALCIFEROL) tablet 1,000 Units  1,000 Units Oral Daily Starleen Blue, NP   1,000 Units at 07/07/22 0844   PTA Medications: Medications Prior to Admission  Medication Sig Dispense Refill Last Dose   lamoTRIgine (LAMICTAL) 100 MG tablet Take 100 mg by mouth daily.   Past Month   traZODone (DESYREL) 50 MG tablet Take 50 mg by mouth at bedtime as needed for sleep.   Past Month   OLANZapine (ZYPREXA) 5 MG tablet Take 1 tablet (5 mg total) by mouth at bedtime. (Patient not taking: Reported on 07/01/2022) 30 tablet 0 Not Taking   venlafaxine (EFFEXOR) 37.5 MG tablet Take 37.5 mg by mouth daily. (Patient not taking: Reported on 05/18/2022)   Not Taking  Patient Stressors: Financial difficulties   Marital or family conflict    Patient Strengths: Average or above average intelligence  Forensic psychologist fund of knowledge  Physical Health   Treatment Modalities: Medication Management, Group therapy, Case management,  1 to 1 session with clinician, Psychoeducation, Recreational therapy.   Physician Treatment Plan for Primary Diagnosis: Schizoaffective disorder, bipolar type (HCC) Long Term Goal(s): Improvement in symptoms so as ready for discharge   Short Term Goals: Ability to identify changes in lifestyle to reduce recurrence of condition will improve Ability to verbalize feelings will improve Ability to disclose and discuss suicidal ideas Ability to demonstrate self-control will improve Ability to identify and develop effective coping behaviors will improve Ability to maintain clinical measurements  within normal limits will improve Compliance with prescribed medications will improve Ability to identify triggers associated with substance abuse/mental health issues will improve  Medication Management: Evaluate patient's response, side effects, and tolerance of medication regimen.  Therapeutic Interventions: 1 to 1 sessions, Unit Group sessions and Medication administration.  Evaluation of Outcomes: Progressing  Physician Treatment Plan for Secondary Diagnosis: Principal Problem:   Schizoaffective disorder, bipolar type (HCC) Active Problems:   Delta-9-tetrahydrocannabinol (THC) dependence (HCC)   Insomnia   Tic disorder, unspecified  Long Term Goal(s): Improvement in symptoms so as ready for discharge   Short Term Goals: Ability to identify changes in lifestyle to reduce recurrence of condition will improve Ability to verbalize feelings will improve Ability to disclose and discuss suicidal ideas Ability to demonstrate self-control will improve Ability to identify and develop effective coping behaviors will improve Ability to maintain clinical measurements within normal limits will improve Compliance with prescribed medications will improve Ability to identify triggers associated with substance abuse/mental health issues will improve     Medication Management: Evaluate patient's response, side effects, and tolerance of medication regimen.  Therapeutic Interventions: 1 to 1 sessions, Unit Group sessions and Medication administration.  Evaluation of Outcomes: Progressing   RN Treatment Plan for Primary Diagnosis: Schizoaffective disorder, bipolar type (HCC) Long Term Goal(s): Knowledge of disease and therapeutic regimen to maintain health will improve  Short Term Goals: Ability to remain free from injury will improve, Ability to verbalize frustration and anger appropriately will improve, Ability to demonstrate self-control, Ability to participate in decision making will  improve, Ability to verbalize feelings will improve, Ability to disclose and discuss suicidal ideas, Ability to identify and develop effective coping behaviors will improve, and Compliance with prescribed medications will improve  Medication Management: RN will administer medications as ordered by provider, will assess and evaluate patient's response and provide education to patient for prescribed medication. RN will report any adverse and/or side effects to prescribing provider.  Therapeutic Interventions: 1 on 1 counseling sessions, Psychoeducation, Medication administration, Evaluate responses to treatment, Monitor vital signs and CBGs as ordered, Perform/monitor CIWA, COWS, AIMS and Fall Risk screenings as ordered, Perform wound care treatments as ordered.  Evaluation of Outcomes: Progressing   LCSW Treatment Plan for Primary Diagnosis: Schizoaffective disorder, bipolar type (HCC) Long Term Goal(s): Safe transition to appropriate next level of care at discharge, Engage patient in therapeutic group addressing interpersonal concerns.  Short Term Goals: Engage patient in aftercare planning with referrals and resources, Increase social support, Increase ability to appropriately verbalize feelings, Increase emotional regulation, Facilitate acceptance of mental health diagnosis and concerns, Facilitate patient progression through stages of change regarding substance use diagnoses and concerns, Identify triggers associated with mental health/substance abuse issues, and Increase skills for wellness and recovery  Therapeutic Interventions: Assess for all discharge needs, 1 to 1 time with Child psychotherapist, Explore available resources and support systems, Assess for adequacy in community support network, Educate family and significant other(s) on suicide prevention, Complete Psychosocial Assessment, Interpersonal group therapy.  Evaluation of Outcomes: Progressing   Progress in Treatment: Attending groups:  Yes. Participating in groups: Yes. Taking medication as prescribed: Yes. Toleration medication: Yes. Family/Significant other contact made: Yes, individual(s) contacted:  Estill Bamberg- [phone]  [facility] contact [phone]  Patient understands diagnosis: No. Discussing patient identified problems/goals with staff: Yes. Medical problems stabilized or resolved: Yes. Denies suicidal/homicidal ideation: Yes. Issues/concerns per patient self-inventory: Yes. Other: N/A  New problem(s) identified: No, Describe:  None Known  New Short Term/Long Term Goal(s): medication stabilization, elimination of SI thoughts, development of comprehensive mental wellness plan.  Medication Stabilization  Patient Goals:  Medication Stabilization  Discharge Plan or Barriers: Patient recently admitted. CSW will continue to follow and assess for appropriate referrals and possible discharge planning.      Reason for Continuation of Hospitalization: Delusions  Depression Medication stabilization  Estimated Length of Stay: 3-7 Days  Last 3 [place] Suicide Severity Risk Score: Flowsheet Row Ad[facility] (Current) [facility] from [facility]18/2022 in [facility] ED from 04/11/2022 in [facility]  C-SSRS RISK CATEGORY No Risk No Risk No Risk       Last PHQ 2/9 Scores:     No data to display          medication stabilization, elimination of SI thoughts, development of comprehensive mental wellness plan.    Scribe for Treatment Team: Ane Payment, LCSW 07/07/2022 1:21 PM
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
## 2022-07-07 NOTE — Group Note (Unsigned)
Date:  07/07/2022 Time:  2:23 PM  Group Topic/Focus:  Goals Group:   The focus of this group is to help patients establish daily goals to achieve during treatment and discuss how the patient can incorporate goal setting into their daily lives to aide in recovery. Orientation:   The focus of this group is to educate the patient on the purpose and policies of crisis stabilization and provide a format to answer questions about their admission.  The group details unit policies and expectations of patients while admitted.     Participation Level:  {BHH PARTICIPATION LEVEL:22264}  Participation Quality:  {BHH PARTICIPATION QUALITY:22265}  Affect:  {BHH AFFECT:22266}  Cognitive:  {BHH COGNITIVE:22267}  Insight: {BHH Insight2:20797}  Engagement in Group:  {BHH ENGAGEMENT IN GROUP:22268}  Modes of Intervention:  {BHH MODES OF INTERVENTION:22269}  Additional Comments:  ***  Travas Schexnayder W Dorin Stooksbury 07/07/2022, 2:23 PM  
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
## 2022-07-08 DIAGNOSIS — F250 Schizoaffective disorder, bipolar type: Secondary | ICD-10-CM | POA: Diagnosis not present

## 2022-07-08 NOTE — BHH Group Notes (Signed)
Adult Psychoeducational Group Note  Date:  07/08/2022 Time:  8:31 PM  Group Topic/Focus:  Wrap-Up Group:   The focus of this group is to help patients review their daily goal of treatment and discuss progress on daily workbooks.  Participation Level:  Active  Participation Quality:  Appropriate  Affect:  Appropriate  Cognitive:  Appropriate  Insight: Appropriate  Engagement in Group:  Engaged  Modes of Intervention:  Discussion  Additional Comments:   Pt states that she had a "roller coaster" of a day today due to the fact that she got triggered by a movie and spent some time crying. Pt states that she still feels anxious and depressed from earlier but is happy that she allowed herself to cry because she doesn't usually do that. Pt denies AVH, SI, and HI. Pt endorsed inflammation pains which she states she's made her nurse aware of.   Vevelyn Pat 07/08/2022, 8:31 PM

## 2022-07-08 NOTE — Progress Notes (Signed)
D- Patient alert and oriented. Denies SI, HI, AVH. Patient states that she feels better this evening after crying since the first time since admission. Patient reports 3/10 pain that she describes as generalized.  A- Scheduled medications administered to patient along with PRN tylenol, per MAR. Support and encouragement provided.  Routine safety checks conducted every 15 minutes.  Patient informed to notify staff with problems or concerns.  R- No adverse drug reactions noted. Patient contracts for safety at this time. Patient compliant with medications and treatment plan. Patient receptive, calm, and cooperative. Patient interacts well with others on the unit.  Patient remains safe at this time.

## 2022-07-08 NOTE — Progress Notes (Signed)
    07/08/22 1600  Psych Admission Type (Psych Patients Only)  Admission Status Involuntary  Psychosocial Assessment  Patient Complaints None  Eye Contact Fair  Facial Expression Anxious  Affect Euphoric;Preoccupied  Speech Logical/coherent  Interaction Assertive  Motor Activity Slow  Appearance/Hygiene Unremarkable  Behavior Characteristics Appropriate to situation  Mood Anxious  Thought Process  Coherency WDL  Content WDL  Delusions Paranoid  Perception WDL  Hallucination None reported or observed  Judgment Impaired  Confusion None  Danger to Self  Current suicidal ideation? Denies  Danger to Others  Danger to Others None reported or observed

## 2022-07-08 NOTE — Plan of Care (Signed)
  Problem: Education: Goal: Emotional status will improve Outcome: Progressing Goal: Mental status will improve Outcome: Progressing   Problem: Activity: Goal: Interest or engagement in activities will improve Outcome: Progressing   Problem: Health Behavior/Discharge Planning: Goal: Compliance with treatment plan for underlying cause of condition will improve Outcome: Progressing   

## 2022-07-08 NOTE — Progress Notes (Signed)
[facility] MD Progress Note  07/08/2022 3:30 PM Jennifer York  MRN:  [id number]  Principal Problem: Schizoaffective disorder, bipolar type (HCC) Diagnosis: Principal Problem:   Schizoaffective disorder, bipolar type (HCC) Active Problems:   Delta-9-tetrahydrocannabinol (THC) dependence (HCC)   Insomnia   Tic disorder, unspecified  Reason For Admission: Pt is a 32 y.o. female presenting to [facility] under IVC from [facility] petitioned by her mother Jennifer York) and IVC paperwork states that "Respondent has been diagnosed with major depressive disorder, psychosis, and an altered mental status. She refused to take her prescribed medication and extremely combative towards family members. She has threatened to kill herself, mother, father, and brother. Her mother had to hide all sharp objects in the home after she found her rubbing a knife up and down her arm. She is not sleeping nor eating and is unable to care for herself. Respondent is in need of a mental health evaluation."  Patient was involuntarily transferred to this [facility] on 07/01/22 for treatment and stabilization of her mental status.  24 hr chart review: Sleep Hours last night: None documented, observed to be sleeping earlier today morning, but reports a good sleep quality last night. Nursing Concerns: No concerns per nursing reports and documentation Behavioral episodes in the past 24 hrs: None Medication Compliance: Compliant Vital Signs in the past 24 hrs: BP earlier today morning low at 99/49.  Nursing asked to recheck.  Patient is symptomatic PRN Medications in the past 24 hrs: Tylenol and Nicorette gum  Patient assessment note: During initial encounter today morning, patient reported that she was feeling "very tired", requested that writer return at a later time for assessment.  On returning the second time, patient was more open to having assessment completed, and was observed to be sitting in the day room.  Patient continues to present  with paranoia, still somewhat bizarre, observed to be sitting in the day room with eyes closed and slowly swinging her head from side-to-side.  When asked questions regarding paranoia, patient reports "those thoughts have mostly gone away", and states the same thing regarding questions related to thought insertion and thought withdrawal.  Patient continues to present with delusions of persecution as evidenced by her starting angry tone when writer asked her if she would consent to her family or her boyfriend being called today.  Patient reported that she was still not okay with what they did to her prior to her hospitalization.  She reported still being very angry at them, and states that she does not consent to any information being shared with them at this time.  Writer asked patient where she intends to reside after discharge, and she states that she has a couple of friends but is thinking about it.  Patient denies medication related side effects, reports her sleep last night was good, but woke up tired, reports a good appetite, denies medication related side effects.  Denies SI/HI/AVH.  Continuing medications as listed below, pt's present presentation is a significant improvement from presentation at admission.  Projected discharge date is Tuesday, 6/18 if symptoms continue to resolve, and if safety planning as well as follow up appointments can be completed for continuity of care after discharge.  Total Time spent with patient: 45 minutes  Past Psychiatric History: See H & P  Past Medical History:  Past Medical History:  Diagnosis Date   Family history of adverse reaction to anesthesia    father has woken up during surgery   Medical history non-contributory  Past Surgical History:  Procedure Laterality Date   DILATION AND EVACUATION N/A 12/24/2019   Procedure: SUCTION DILATION AND EVACUATION;  Surgeon: Reva Bores, MD;  Location: [facility];  Service: Gynecology;   Laterality: N/A;   Family History:  Family History  Problem Relation Age of Onset   Depression Mother    Depression Father    Depression Brother    Family Psychiatric  History: See H & P Social History:  Social History   Substance and Sexual Activity  Alcohol Use Yes   Comment: drinks 'occasionally'     Social History   Substance and Sexual Activity  Drug Use Yes   Types: Marijuana   Comment: smokes weed 'occasionally'    Social History   Socioeconomic History   Marital status: Single    Spouse name: Not on file   Number of children: Not on file   Years of education: Not on file   Highest education level: Not on file  Occupational History   Not on file  Tobacco Use   Smoking status: Every Day    Packs/day: 1    Types: Cigarettes   Smokeless tobacco: Current    Types: Chew  Vaping Use   Vaping Use: Every day   Substances: Nicotine  Substance and Sexual Activity   Alcohol use: Yes    Comment: drinks 'occasionally'   Drug use: Yes    Types: Marijuana    Comment: smokes weed 'occasionally'   Sexual activity: Yes    Birth control/protection: None  Other Topics Concern   Not on file  Social History Narrative   Not on file   Social Determinants of Health   Financial Resource Strain: Not on file  Food Insecurity: Patient Declined (07/01/2022)   Hunger Vital Sign    Worried About Running Out of Food in the Last Year: Patient declined    Ran Out of Food in the Last Year: Patient declined  Transportation Needs: Patient Declined (07/01/2022)   PRAPARE - Administrator, Civil Service (Medical): Patient declined    Lack of Transportation (Non-Medical): Patient declined  Physical Activity: Not on file  Stress: Not on file  Social Connections: Not on file     Current Medications: Current Facility-Administered Medications  Medication Dose Route Frequency Provider Last Rate Last Admin   acetaminophen (TYLENOL) tablet 650 mg  650 mg Oral Q6H PRN White,  Patrice L, NP   650 mg at 07/07/22 2050   alum & mag hydroxide-simeth (MAALOX/MYLANTA) 200-200-20 MG/5ML suspension 30 mL  30 mL Oral Q4H PRN White, Patrice L, NP       diphenhydrAMINE (BENADRYL) capsule 50 mg  50 mg Oral TID PRN White, Patrice L, NP       Or   diphenhydrAMINE (BENADRYL) injection 50 mg  50 mg Intramuscular TID PRN White, Patrice L, NP       feeding supplement (ENSURE ENLIVE / ENSURE PLUS) liquid 237 mL  237 mL Oral BID BM Rex Kras, MD   237 mL at 07/08/22 0941   guanFACINE (INTUNIV) ER tablet 1 mg  1 mg Oral Daily Yukari Flax, NP   1 mg at 07/08/22 0819   haloperidol (HALDOL) tablet 5 mg  5 mg Oral TID PRN White, Patrice L, NP       Or   haloperidol lactate (HALDOL) injection 5 mg  5 mg Intramuscular TID PRN White, Patrice L, NP       hydrOXYzine (ATARAX) tablet 25 mg  25 mg Oral TID PRN Liborio Nixon L, NP   25 mg at 07/04/22 2049   ibuprofen (ADVIL) tablet 400 mg  400 mg Oral Q6H PRN Bobbitt, Shalon E, NP       lamoTRIgine (LAMICTAL) tablet 25 mg  25 mg Oral Q12H Massengill, Harrold Donath, MD   25 mg at 07/08/22 0819   LORazepam (ATIVAN) tablet 2 mg  2 mg Oral TID PRN White, Patrice L, NP       Or   LORazepam (ATIVAN) injection 2 mg  2 mg Intramuscular TID PRN White, Patrice L, NP       magnesium hydroxide (MILK OF MAGNESIA) suspension 30 mL  30 mL Oral Daily PRN White, Patrice L, NP       nicotine polacrilex (NICORETTE) gum 2 mg  2 mg Oral PRN Rex Kras, MD   2 mg at 07/08/22 1610   risperiDONE (RISPERDAL M-TABS) disintegrating tablet 1 mg  1 mg Oral Daily Massengill, Nathan, MD   1 mg at 07/08/22 0819   risperiDONE (RISPERDAL M-TABS) disintegrating tablet 2 mg  2 mg Oral QHS Massengill, Nathan, MD   2 mg at 07/07/22 2050   traZODone (DESYREL) tablet 50 mg  50 mg Oral QHS PRN Starleen Blue, NP   50 mg at 07/05/22 2050   vitamin D3 (CHOLECALCIFEROL) tablet 1,000 Units  1,000 Units Oral Daily Starleen Blue, NP   1,000 Units at 07/08/22 0820    Lab Results:   No results found for this or any previous visit (from the past 48 hour(s)).   Blood Alcohol level:  Lab Results  Component Value Date   ETH <10 05/18/2022    Metabolic Disorder Labs: Lab Results  Component Value Date   HGBA1C 4.8 07/05/2022   MPG 91.06 07/05/2022   Lab Results  Component Value Date   PROLACTIN 17.3 05/18/2022   Lab Results  Component Value Date   CHOL 131 07/05/2022   TRIG 40 07/05/2022   HDL 56 07/05/2022   CHOLHDL 2.3 07/05/2022   VLDL 8 07/05/2022   LDLCALC 67 07/05/2022   LDLCALC 66 03/15/2015    Physical Findings: AIMS:  , ,  ,  ,    CIWA:    COWS:     Musculoskeletal: Strength & Muscle Tone: within normal limits Gait & Station: normal Patient leans: N/A  Psychiatric Specialty Exam:  Presentation  General Appearance:  Appropriate for Environment; Fairly Groomed  Eye Contact: Fair  Speech: Clear and Coherent  Speech Volume: Normal  Handedness: Right   Mood and Affect  Mood: Less overwhelmed  Affect: Congruent   Thought Process  Thought Processes: Coherent  Descriptions of Associations:Intact  Orientation:Full (Time, Place and Person)  Thought Content:Illogical  History of Schizophrenia/Schizoaffective disorder:No  Duration of Psychotic Symptoms:N/A  Hallucinations:Hallucinations: None Description of Auditory Hallucinations: Denies  Ideas of Reference:Paranoia; Delusions  Suicidal Thoughts:Suicidal Thoughts: No  Homicidal Thoughts:Homicidal Thoughts: No   Sensorium  Memory: Immediate Good  Judgment: Fair  Insight: Poor   Executive Functions  Concentration: Fair  Attention Span: Fair  Recall: Fair  Fund of Knowledge: Fair  Language: Fair   Psychomotor Activity  Psychomotor Activity: Psychomotor Activity: Normal   Assets  Assets: Resilience   Sleep  Sleep: Sleep: Good Number of Hours of Sleep: 10    Physical Exam: Physical Exam Constitutional:      Appearance:  Normal appearance.  HENT:     Head: Normocephalic.     Nose: Nose normal. No congestion or rhinorrhea.  Pulmonary:  Effort: Pulmonary effort is normal.  Musculoskeletal:     Cervical back: Normal range of motion.  Neurological:     Mental Status: She is alert and oriented to person, place, and time.  Psychiatric:        Behavior: Behavior normal.    Review of Systems  Constitutional:  Negative for fever.  HENT:  Negative for hearing loss.   Eyes:  Negative for blurred vision.  Respiratory:  Positive for cough.   Cardiovascular:  Negative for chest pain.  Gastrointestinal:  Negative for heartburn.  Genitourinary:  Negative for dysuria.  Musculoskeletal:  Negative for myalgias.  Skin:  Negative for rash.  Neurological:  Negative for dizziness.  Psychiatric/Behavioral:  Positive for depression and substance abuse. Negative for hallucinations, memory loss and suicidal ideas. The patient is nervous/anxious. The patient does not have insomnia.    Blood pressure (!) 99/49, pulse 75, temperature 98.3 F (36.8 C), temperature source Oral, resp. rate 20, height 5\' 7"  (1.702 m), weight 59.4 kg, SpO2 99 %. Body mass index is 20.52 kg/m.  Treatment Plan Summary: Daily contact with patient to assess and evaluate symptoms and progress in treatment and Medication management  Assessment: Diagnoses Principal Problem:   Schizoaffective disorder, bipolar type (HCC)   Delta-9-tetrahydrocannabinol (THC) dependence (HCC)   Insomnia   Tic disorder, unspecified   Plan: Safety and Monitoring: Voluntary admission to inpatient psychiatric unit for safety, stabilization and treatment Daily contact with patient to assess and evaluate symptoms and progress in treatment Patient's case to be discussed in multi-disciplinary team meeting Observation Level : q15 minute checks Vital signs: q12 hours Precautions: Safety    Medications: -Continue Lamictal 25 mg BID for mood stabilization d/t risk of  SJS with restarting at high dose.   -Continue Risperdal 1 mg every morning and 2 mg nightly -  for psychosis/mood stabilization   -Continue guanfacine send 1 mg daily for trouble concentrating   -Continue trazodone 50 mg nightly for sleep   -Continue hydroxyzine 3 times daily as needed for anxiety   -Continue agitation protocol medications: Haldol/Benadryl/Ativan 3 times daily as needed every 6 hours   Other PRNS -Continue Tylenol 650 mg every 6 hours PRN for mild pain -Continue Maalox 30 mg every 4 hrs PRN for indigestion -Continue Milk of Magnesia as needed every 6 hrs for constipation  Discharge Planning: Social work and case management to assist with discharge planning and identification of hospital follow-up needs prior to discharge Estimated LOS: 5-7 days Discharge Concerns: Need to establish a safety plan; Medication compliance and effectiveness Discharge Goals: Return home with outpatient referrals for mental health follow-up including medication management/psychotherapy   I certify that inpatient services furnished can reasonably be expected to improve the patient's condition.    Starleen Blue, NP 07/08/2022, 3:30 PM

## 2022-07-09 DIAGNOSIS — F250 Schizoaffective disorder, bipolar type: Secondary | ICD-10-CM | POA: Diagnosis not present

## 2022-07-09 NOTE — [facility] Group Notes (Signed)
[facility] Group Notes:  (Nursing/MHT/Case Management/Adjunct)  Date:  07/09/2022  Time: 1000  Type of Therapy:  Psychoeducational Skills  Participation Level:  did not attend  Participation Quality:    Affect:    Cognitive:   Insight:    Engagement in Group:  engaged, supportive  Modes of Intervention:  Discussion, Education, and Exploration  Summary of Progress/Problems: Patients were given two poems to read, one titled ''watch your thoughts for they become your actions'' and another '' the owl and the chimpanzee'' by Jo Camacho. Patients were given education on positive reframing and asked to identify one negative belief about themselves they would like to heal from to impact positive mental well being. Discussion, education and education of healthy coping skills were also discussed in group. Pt did not attend.  

## 2022-07-09 NOTE — Progress Notes (Signed)
   07/09/22 0553  15 Minute Checks  Location Bedroom  Visual Appearance Calm  Behavior Sleeping  Sleep (Behavioral Health Patients Only)  Calculate sleep? (Click Yes once per 24 hr at 0600 safety check) Yes  Documented sleep last 24 hours 10

## 2022-07-09 NOTE — Group Note (Signed)
Date:  07/09/2022 Time:  9:08 PM  Group Topic/Focus:  Wrap-Up Group:   The focus of this group is to help patients review their daily goal of treatment and discuss progress on daily workbooks.    Participation Level:  Active  Participation Quality:  Appropriate  Affect:  Appropriate  Cognitive:  Appropriate  Insight: Appropriate  Engagement in Group:  Engaged  Modes of Intervention:  Education and Exploration  Additional Comments:  Patient attended and participated in group tonight. She report that she received her glasses today wich w2as significant for her.  Lita Mains [facility] 07/09/2022, 9:08 PM

## 2022-07-09 NOTE — Progress Notes (Signed)
[facility] MD Progress Note  07/09/2022 3:09 PM [name]  MRN:  [id number]  Principal Problem: Schizoaffective disorder, bipolar type (HCC) Diagnosis: Principal Problem:   Schizoaffective disorder, bipolar type (HCC) Active Problems:   Delta-9-tetrahydrocannabinol (THC) dependence (HCC)   Insomnia   Tic disorder, unspecified  Reason For Admission: Pt is a 32 y.o. female presenting to [facility] under IVC from [facility] petitioned by her mother Lovella Kusler) and IVC paperwork states that "Respondent has been diagnosed with major depressive disorder, psychosis, and an altered mental status. She refused to take her prescribed medication and extremely combative towards family members. She has threatened to kill herself, mother, father, and brother. Her mother had to hide all sharp objects in the home after she found her rubbing a knife up and down her arm. She is not sleeping nor eating and is unable to care for herself. Respondent is in need of a mental health evaluation."  Patient was involuntarily transferred to this [facility] on 07/01/22 for treatment and stabilization of her mental status.  24 hr chart review: Sleep Hours last night: None documented, observed to be sleeping earlier today morning, but reports a good sleep quality last night. Nursing Concerns: No concerns per nursing reports and documentation Behavioral episodes in the past 24 hrs: None Medication Compliance: Compliant Vital Signs in the past 24 hrs: WNL PRN Medications in the past 24 hrs: Tylenol   Patient assessment note: During encounter today, there is some lingering delusional thoughts, but she is recognizing that these might not be rationale. Pt is asked if, she is still getting special messages from God, and she states: "It's a religious thing, but we will keep that separate. It's a no for now."  This is an improvement, as patient in the past few days has adamantly believed that she is getting messages from God.  Patient's  symptoms have significantly improved since hospitalization.  She is now agreeable to calling her mother, states that she no longer feels like her family is out to harm her, and no longer feels like anyone else is out to harm her.  She reports that her sleep last night was good, denies medication related side effects, reports a good appetite.  Denies SI/HI/AVH.  Continuing medications as listed below,  Projected discharge date remains Tuesday, 6/18 if symptoms continue to resolve, and if safety planning as well as follow up appointments can be completed for continuity of care after discharge.  We are continuing medications as listed below with no changes today.  Total Time spent with patient: 45 minutes  Past Psychiatric History: See H & P  Past Medical History:  Past Medical History:  Diagnosis Date   Family history of adverse reaction to anesthesia    father has woken up during surgery   Medical history non-contributory     Past Surgical History:  Procedure Laterality Date   DILATION AND EVACUATION N/A 12/24/2019   Procedure: SUCTION DILATION AND EVACUATION;  Surgeon: Reva Bores, MD;  Location: [facility];  Service: Gynecology;  Laterality: N/A;   Family History:  Family History  Problem Relation Age of Onset   Depression Mother    Depression Father    Depression Brother    Family Psychiatric  History: See H & P Social History:  Social History   Substance and Sexual Activity  Alcohol Use Yes   Comment: drinks 'occasionally'     Social History   Substance and Sexual Activity  Drug Use Yes   Types:  Marijuana   Comment: smokes weed 'occasionally'    Social History   Socioeconomic History   Marital status: Single    Spouse name: Not on file   Number of children: Not on file   Years of education: Not on file   Highest education level: Not on file  Occupational History   Not on file  Tobacco Use   Smoking status: Every Day    Packs/day: 1    Types:  Cigarettes   Smokeless tobacco: Current    Types: Chew  Vaping Use   Vaping Use: Every day   Substances: Nicotine  Substance and Sexual Activity   Alcohol use: Yes    Comment: drinks 'occasionally'   Drug use: Yes    Types: Marijuana    Comment: smokes weed 'occasionally'   Sexual activity: Yes    Birth control/protection: None  Other Topics Concern   Not on file  Social History Narrative   Not on file   Social Determinants of Health   Financial Resource Strain: Not on file  Food Insecurity: Patient Declined (07/01/2022)   Hunger Vital Sign    Worried About Running Out of Food in the Last Year: Patient declined    Ran Out of Food in the Last Year: Patient declined  Transportation Needs: Patient Declined (07/01/2022)   PRAPARE - Administrator, Civil Service (Medical): Patient declined    Lack of Transportation (Non-Medical): Patient declined  Physical Activity: Not on file  Stress: Not on file  Social Connections: Not on file     Current Medications: Current Facility-Administered Medications  Medication Dose Route Frequency Provider Last Rate Last Admin   acetaminophen (TYLENOL) tablet 650 mg  650 mg Oral Q6H PRN White, Patrice L, NP   650 mg at 07/09/22 1044   alum & mag hydroxide-simeth (MAALOX/MYLANTA) 200-200-20 MG/5ML suspension 30 mL  30 mL Oral Q4H PRN White, Patrice L, NP       diphenhydrAMINE (BENADRYL) capsule 50 mg  50 mg Oral TID PRN White, Patrice L, NP       Or   diphenhydrAMINE (BENADRYL) injection 50 mg  50 mg Intramuscular TID PRN White, Patrice L, NP       feeding supplement (ENSURE ENLIVE / ENSURE PLUS) liquid 237 mL  237 mL Oral BID BM Goli, Veeraindar, MD   237 mL at 07/09/22 0955   guanFACINE (INTUNIV) ER tablet 1 mg  1 mg Oral Daily Starleen Blue, NP   1 mg at 07/09/22 0816   haloperidol (HALDOL) tablet 5 mg  5 mg Oral TID PRN White, Patrice L, NP       Or   haloperidol lactate (HALDOL) injection 5 mg  5 mg Intramuscular TID PRN White,  Patrice L, NP       hydrOXYzine (ATARAX) tablet 25 mg  25 mg Oral TID PRN White, Patrice L, NP   25 mg at 07/04/22 2049   ibuprofen (ADVIL) tablet 400 mg  400 mg Oral Q6H PRN Bobbitt, Shalon E, NP       lamoTRIgine (LAMICTAL) tablet 25 mg  25 mg Oral Q12H Massengill, Nathan, MD   25 mg at 07/09/22 0816   LORazepam (ATIVAN) tablet 2 mg  2 mg Oral TID PRN White, Patrice L, NP       Or   LORazepam (ATIVAN) injection 2 mg  2 mg Intramuscular TID PRN White, Patrice L, NP       magnesium hydroxide (MILK OF MAGNESIA) suspension 30 mL  30 mL Oral Daily PRN White, Patrice L, NP       nicotine polacrilex (NICORETTE) gum 2 mg  2 mg Oral PRN Rex Kras, MD   2 mg at 07/08/22 1707   risperiDONE (RISPERDAL M-TABS) disintegrating tablet 1 mg  1 mg Oral Daily Massengill, Nathan, MD   1 mg at 07/09/22 0816   risperiDONE (RISPERDAL M-TABS) disintegrating tablet 2 mg  2 mg Oral QHS Massengill, Nathan, MD   2 mg at 07/08/22 2039   traZODone (DESYREL) tablet 50 mg  50 mg Oral QHS PRN Starleen Blue, NP   50 mg at 07/05/22 2050   vitamin D3 (CHOLECALCIFEROL) tablet 1,000 Units  1,000 Units Oral Daily Starleen Blue, NP   1,000 Units at 07/09/22 0816    Lab Results:  No results found for this or any previous visit (from the past 48 hour(s)).   Blood Alcohol level:  Lab Results  Component Value Date   ETH <10 05/18/2022    Metabolic Disorder Labs: Lab Results  Component Value Date   HGBA1C 4.8 07/05/2022   MPG 91.06 07/05/2022   Lab Results  Component Value Date   PROLACTIN 17.3 05/18/2022   Lab Results  Component Value Date   CHOL 131 07/05/2022   TRIG 40 07/05/2022   HDL 56 07/05/2022   CHOLHDL 2.3 07/05/2022   VLDL 8 07/05/2022   LDLCALC 67 07/05/2022   LDLCALC 66 03/15/2015    Physical Findings: AIMS:  , ,  ,  ,    CIWA:    COWS:     Musculoskeletal: Strength & Muscle Tone: within normal limits Gait & Station: normal Patient leans: N/A  Psychiatric Specialty  Exam:  Presentation  General Appearance:  Appropriate for Environment; Fairly Groomed  Eye Contact: Good  Speech: Clear and Coherent  Speech Volume: Normal  Handedness: Right   Mood and Affect  Mood: Less overwhelmed  Affect: Congruent   Thought Process  Thought Processes: Coherent  Descriptions of Associations:Intact  Orientation:Full (Time, Place and Person)  Thought Content:Logical  History of Schizophrenia/Schizoaffective disorder:No  Duration of Psychotic Symptoms:N/A  Hallucinations:Hallucinations: None  Ideas of Reference:Delusions  Suicidal Thoughts:Suicidal Thoughts: No  Homicidal Thoughts:Homicidal Thoughts: No   Sensorium  Memory: Immediate Good  Judgment: Fair  Insight: Fair   Chartered certified accountant: Fair  Attention Span: Fair  Recall: Fair  Fund of Knowledge: Fair  Language: Fair   Psychomotor Activity  Psychomotor Activity: Psychomotor Activity: Normal   Assets  Assets: Communication Skills   Sleep  Sleep: Sleep: Good    Physical Exam: Physical Exam Constitutional:      Appearance: Normal appearance.  HENT:     Head: Normocephalic.     Nose: Nose normal. No congestion or rhinorrhea.  Pulmonary:     Effort: Pulmonary effort is normal.  Musculoskeletal:     Cervical back: Normal range of motion.  Neurological:     Mental Status: She is alert and oriented to person, place, and time.  Psychiatric:        Behavior: Behavior normal.    Review of Systems  Constitutional:  Negative for fever.  HENT:  Negative for hearing loss.   Eyes:  Negative for blurred vision.  Respiratory:  Positive for cough.   Cardiovascular:  Negative for chest pain.  Gastrointestinal:  Negative for heartburn.  Genitourinary:  Negative for dysuria.  Musculoskeletal:  Negative for myalgias.  Skin:  Negative for rash.  Neurological:  Negative for dizziness.  Psychiatric/Behavioral:  Positive for  depression  and substance abuse. Negative for hallucinations, memory loss and suicidal ideas. The patient is nervous/anxious. The patient does not have insomnia.    Blood pressure 120/88, pulse 89, temperature 98 F (36.7 C), temperature source Oral, resp. rate 14, height 5\' 7"  (1.702 m), weight 59.4 kg, SpO2 100 %. Body mass index is 20.52 kg/m.  Treatment Plan Summary: Daily contact with patient to assess and evaluate symptoms and progress in treatment and Medication management  Assessment: Diagnoses Principal Problem:   Schizoaffective disorder, bipolar type (HCC)   Delta-9-tetrahydrocannabinol (THC) dependence (HCC)   Insomnia   Tic disorder, unspecified   Plan: Safety and Monitoring: Voluntary admission to inpatient psychiatric unit for safety, stabilization and treatment Daily contact with patient to assess and evaluate symptoms and progress in treatment Patient's case to be discussed in multi-disciplinary team meeting Observation Level : q15 minute checks Vital signs: q12 hours Precautions: Safety    Medications: -Continue Lamictal 25 mg BID for mood stabilization d/t risk of SJS with restarting at high dose.   -Continue Risperdal 1 mg every morning and 2 mg nightly -  for psychosis/mood stabilization   -Continue guanfacine send 1 mg daily for trouble concentrating   -Continue trazodone 50 mg nightly for sleep   -Continue hydroxyzine 3 times daily as needed for anxiety   -Continue agitation protocol medications: Haldol/Benadryl/Ativan 3 times daily as needed every 6 hours   Other PRNS -Continue Tylenol 650 mg every 6 hours PRN for mild pain -Continue Maalox 30 mg every 4 hrs PRN for indigestion -Continue Milk of Magnesia as needed every 6 hrs for constipation  Discharge Planning: Social work and case management to assist with discharge planning and identification of hospital follow-up needs prior to discharge Estimated LOS: 5-7 days Discharge Concerns: Need to establish a  safety plan; Medication compliance and effectiveness Discharge Goals: Return home with outpatient referrals for mental health follow-up including medication management/psychotherapy   I certify that inpatient services furnished can reasonably be expected to improve the patient's condition.    Starleen Blue, NP 07/09/2022, 3:09 PM Patient ID: [name], female   DOB: 16-Mar-1990, 32 y.o.   MRN: [id number]

## 2022-07-09 NOTE — Progress Notes (Signed)
   07/09/22 1100  Psych Admission Type (Psych Patients Only)  Admission Status Involuntary  Psychosocial Assessment  Patient Complaints None  Eye Contact Fair  Facial Expression Anxious  Affect Appropriate to circumstance  Speech Logical/coherent  Interaction Assertive  Motor Activity Other (Comment)  Appearance/Hygiene Unremarkable  Behavior Characteristics Appropriate to situation  Mood Anxious  Thought Process  Coherency WDL  Content WDL  Delusions None reported or observed  Perception WDL  Hallucination None reported or observed  Judgment Impaired  Confusion None  Danger to Self  Current suicidal ideation? Denies  Danger to Others  Danger to Others None reported or observed

## 2022-07-09 NOTE — [facility] Group Notes (Signed)
[facility] Group Notes:  (Nursing/MHT/Case Management/Adjunct)  Date:  07/09/2022  Time:0900  Type of Therapy:  Psychoeducational Skills/Daily Goals Group  Participation Level:  did not attend  Participation Quality:    Affect:    Cognitive:    Insight:  Engagement in Group:    Modes of Intervention:  discussion  Summary of Progress/Problems: Pts were asked to emotionally check in and share about how they were feeling. Community meeting unit ward rules were also discussed. Pt did not attend.  

## 2022-07-10 DIAGNOSIS — F250 Schizoaffective disorder, bipolar type: Secondary | ICD-10-CM | POA: Diagnosis not present

## 2022-07-10 MED ORDER — AMOXICILLIN 500 MG PO CAPS
500.0000 mg | ORAL_CAPSULE | Freq: Three times a day (TID) | ORAL | Status: DC
  Administered 2022-07-10 – 2022-07-11 (×3): 500 mg via ORAL
  Filled 2022-07-10 (×3): qty 1
  Filled 2022-07-10: qty 2
  Filled 2022-07-10 (×3): qty 1
  Filled 2022-07-10: qty 2
  Filled 2022-07-10 (×2): qty 1
  Filled 2022-07-10: qty 2
  Filled 2022-07-10: qty 1

## 2022-07-10 NOTE — Plan of Care (Signed)
  Problem: Safety: Goal: Periods of time without injury will increase Outcome: Progressing   

## 2022-07-10 NOTE — Plan of Care (Signed)
°  Problem: Education: °Goal: Emotional status will improve °Outcome: Progressing °Goal: Mental status will improve °Outcome: Progressing °Goal: Verbalization of understanding the information provided will improve °Outcome: Progressing °  °

## 2022-07-10 NOTE — Progress Notes (Signed)
   07/10/22 0814  Psych Admission Type (Psych Patients Only)  Admission Status Involuntary  Psychosocial Assessment  Patient Complaints None  Eye Contact Fair  Facial Expression Anxious  Affect Appropriate to circumstance  Speech Logical/coherent  Interaction Assertive  Motor Activity Other (Comment) (WDL)  Appearance/Hygiene Unremarkable  Behavior Characteristics Appropriate to situation;Cooperative  Mood Anxious;Pleasant  Thought Process  Coherency WDL  Content WDL  Delusions None reported or observed  Perception WDL  Hallucination None reported or observed  Judgment Impaired  Confusion None  Danger to Self  Current suicidal ideation? Denies  Agreement Not to Harm Self Yes  Description of Agreement Verbal  Danger to Others  Danger to Others None reported or observed

## 2022-07-10 NOTE — Plan of Care (Signed)
  Problem: Education: Goal: Knowledge of [place] [facility] Education information/materials will improve Outcome: Progressing   Problem: Education: Goal: Mental status will improve Outcome: Progressing   Problem: Safety: Goal: Periods of time without injury will increase Outcome: Progressing

## 2022-07-10 NOTE — Progress Notes (Signed)
   07/10/22 1610  15 Minute Checks  Location Bedroom  Visual Appearance Calm  Behavior Composed  Sleep (Behavioral Health Patients Only)  Calculate sleep? (Click Yes once per 24 hr at 0600 safety check) Yes  Documented sleep last 24 hours 8.75

## 2022-07-10 NOTE — Group Note (Signed)
Recreation Therapy Group Note   Group Topic:Coping Skills  Group Date: 07/10/2022 Start Time: 1015 End Time: 1025 Facilitators: Clelia Trabucco-McCall, LRT,CTRS Location: [address] Dayroom   Goal Area(s) Addresses: Patient will define what a coping skill is. Patient will work with  to create a list of healthy coping skills beginning with each letter of the alphabet. Patient will successfully identify positive coping skills they can use post d/c.  Patient will acknowledge benefit(s) of using learned coping skills post d/c.   Group Description: Coping A to Z. Patient asked to identify what a coping skill is and when they use them. Patients with Clinical research associate discussed healthy versus unhealthy coping skills. Next patients were given a blank worksheet titled "Coping Skills A-Z".  Patients were instructed to come up with at least one positive coping skill per letter of the alphabet, addressing a specific challenge (ex: stress, anger, anxiety, depression, grief, doubt, isolation, self-harm/suicidal thoughts, substance use). Patients were given 15 minutes to brainstorm before ideas were presented to the large group. Patients and LRT debriefed on the importance of coping skill selection based on situation and back-up plans when a skill tried is not effective. At the end of group, patients were given an handout of alphabetized strategies to keep for future reference.   Affect/Mood: N/A   Participation Level: Did not attend    Clinical Observations/Individualized Feedback:     Plan: Continue to engage patient in RT group sessions 2-3x/week.   Kohler Pellerito-McCall, LRT,CTRS 07/10/2022 1:08 PM

## 2022-07-10 NOTE — Progress Notes (Signed)
   07/09/22 2000  Psych Admission Type (Psych Patients Only)  Admission Status Involuntary  Psychosocial Assessment  Patient Complaints Anxiety;Depression;Hopelessness  Eye Contact Fair  Facial Expression Anxious  Affect Appropriate to circumstance  Speech Logical/coherent  Interaction Assertive  Motor Activity Other (Comment)  Appearance/Hygiene Unremarkable  Behavior Characteristics Appropriate to situation  Mood Pleasant;Anxious  Thought Process  Coherency WDL  Content WDL  Delusions None reported or observed  Perception WDL  Hallucination None reported or observed  Judgment Impaired  Confusion None  Danger to Self  Current suicidal ideation? Denies  Agreement Not to Harm Self Yes  Description of Agreement verbal  Danger to Others  Danger to Others None reported or observed

## 2022-07-10 NOTE — B[facility] Counselor (Signed)
[facility] LCSW Progress Note   07/10/2022    10:35 AM  [name] Toro   [id number]   Type of Contact and Topic:  Collateral Contact  CSW spoke with patient who provided permission to speak with patient boyfriend, Waylan Rocher or [name] (goes by both), [phone].  He reports that the first time speaking with her was last night and he plans to visit tonight.  Patient states that she will stay with him and he confirms this.  CSW provided safety education and ongoing support. Support states that he will call this social worker about an update with patient progress and he will pick patient up when patient is discharging.  No guns/weapons in the house.  No additional safety concerns at this time.     Signed:  Anson [place] MSW, LCSW, LCAS 07/10/2022 10:35 AM

## 2022-07-10 NOTE — Progress Notes (Signed)
   07/10/22 2000  Psych Admission Type (Psych Patients Only)  Admission Status Involuntary  Psychosocial Assessment  Patient Complaints None  Eye Contact Fair  Facial Expression Anxious  Affect Appropriate to circumstance  Speech Logical/coherent  Interaction Assertive  Motor Activity Other (Comment) (wnl)  Appearance/Hygiene Unremarkable  Behavior Characteristics Appropriate to situation  Mood Anxious;Pleasant  Thought Process  Coherency WDL  Content WDL  Delusions None reported or observed  Perception WDL  Hallucination None reported or observed  Judgment Impaired  Confusion None  Danger to Self  Current suicidal ideation? Denies  Agreement Not to Harm Self Yes  Description of Agreement verbal  Danger to Others  Danger to Others None reported or observed   Alert/oriented. Makes needs/concerns known to staff. Pleasant cooperative with staff. Denies SI/HI/A/V hallucinations. Patient states went to group. Will encourage compliance and progression towards goals. Verbally contracted for safety. Will continue to monitor.

## 2022-07-10 NOTE — Progress Notes (Signed)
[facility] MD Progress Note  07/10/2022 3:52 PM Jennifer York  MRN:  [id number]  Principal Problem: Schizoaffective disorder, bipolar type (HCC) Diagnosis: Principal Problem:   Schizoaffective disorder, bipolar type (HCC) Active Problems:   Delta-9-tetrahydrocannabinol (THC) dependence (HCC)   Insomnia   Tic disorder, unspecified  Reason For Admission: Pt is a 32 y.o. female presenting to [facility] under IVC from [facility] petitioned by her mother Jennifer York) and IVC paperwork states that "Respondent has been diagnosed with major depressive disorder, psychosis, and an altered mental status. She refused to take her prescribed medication and extremely combative towards family members. She has threatened to kill herself, mother, father, and brother. Her mother had to hide all sharp objects in the home after she found her rubbing a knife up and down her arm. She is not sleeping nor eating and is unable to care for herself. Respondent is in need of a mental health evaluation."  Patient was involuntarily transferred to this [facility] on 07/01/22 for treatment and stabilization of her mental status.  24 hr chart review: Sleep Hours last night: 8.75 as per nursing documentation, reports a good night's sleep. Nursing Concerns: No concerns per nursing reports and documentation Behavioral episodes in the past 24 hrs: None Medication Compliance: Compliant Vital Signs in the past 24 hrs: WNL PRN Medications in the past 24 hrs: Tylenol, Hydroxyzine  Patient assessment note: Mood today is euthymic, attention to personal hygiene and grooming is fair, eye contact is good, speech is clear & coherent. Thought contents are organized and logical, and pt currently denies SI/HI/AVH or paranoia. There is no evidence of delusional thoughts.    Pt continues to report a good sleep quality last night, reports a good appetite, denies medication related side effects. States that she had a bowel movement earlier today morning,  verbalizing, readiness for discharge.  Projected discharge date remains Tuesday, 6/18 if safety planning as well as follow up appointments can be completed for continuity of care after discharge.  We are continuing medications and addition Amoxicillin  for a tooth infection.Pt states that prior to hospitalization she had difficulty securing a dentist, but plans to secure one after discharge. Educated to call number on the back of her medicaid card for assistance with locating dentists in her network.  Total Time spent with patient: 45 minutes  Past Psychiatric History: See H & P  Past Medical History:  Past Medical History:  Diagnosis Date   Family history of adverse reaction to anesthesia    father has woken up during surgery   Medical history non-contributory     Past Surgical History:  Procedure Laterality Date   DILATION AND EVACUATION N/A 12/24/2019   Procedure: SUCTION DILATION AND EVACUATION;  Surgeon: Reva Bores, MD;  Location: [facility];  Service: Gynecology;  Laterality: N/A;   Family History:  Family History  Problem Relation Age of Onset   Depression Mother    Depression Father    Depression Brother    Family Psychiatric  History: See H & P Social History:  Social History   Substance and Sexual Activity  Alcohol Use Yes   Comment: drinks 'occasionally'     Social History   Substance and Sexual Activity  Drug Use Yes   Types: Marijuana   Comment: smokes weed 'occasionally'    Social History   Socioeconomic History   Marital status: Single    Spouse name: Not on file   Number of children: Not on file   Years  of education: Not on file   Highest education level: Not on file  Occupational History   Not on file  Tobacco Use   Smoking status: Every Day    Packs/day: 1    Types: Cigarettes   Smokeless tobacco: Current    Types: Chew  Vaping Use   Vaping Use: Every day   Substances: Nicotine  Substance and Sexual Activity   Alcohol  use: Yes    Comment: drinks 'occasionally'   Drug use: Yes    Types: Marijuana    Comment: smokes weed 'occasionally'   Sexual activity: Yes    Birth control/protection: None  Other Topics Concern   Not on file  Social History Narrative   Not on file   Social Determinants of Health   Financial Resource Strain: Not on file  Food Insecurity: Patient Declined (07/01/2022)   Hunger Vital Sign    Worried About Running Out of Food in the Last Year: Patient declined    Ran Out of Food in the Last Year: Patient declined  Transportation Needs: Patient Declined (07/01/2022)   PRAPARE - Administrator, Civil Service (Medical): Patient declined    Lack of Transportation (Non-Medical): Patient declined  Physical Activity: Not on file  Stress: Not on file  Social Connections: Not on file     Current Medications: Current Facility-Administered Medications  Medication Dose Route Frequency Provider Last Rate Last Admin   acetaminophen (TYLENOL) tablet 650 mg  650 mg Oral Q6H PRN White, Patrice L, NP   650 mg at 07/10/22 0814   alum & mag hydroxide-simeth (MAALOX/MYLANTA) 200-200-20 MG/5ML suspension 30 mL  30 mL Oral Q4H PRN White, Patrice L, NP       amoxicillin (AMOXIL) capsule 500 mg  500 mg Oral Q8H Triton Heidrich, NP       diphenhydrAMINE (BENADRYL) capsule 50 mg  50 mg Oral TID PRN White, Patrice L, NP       Or   diphenhydrAMINE (BENADRYL) injection 50 mg  50 mg Intramuscular TID PRN White, Patrice L, NP       feeding supplement (ENSURE ENLIVE / ENSURE PLUS) liquid 237 mL  237 mL Oral BID BM Rex Kras, MD   237 mL at 07/10/22 1424   guanFACINE (INTUNIV) ER tablet 1 mg  1 mg Oral Daily Starleen Blue, NP   1 mg at 07/10/22 0813   haloperidol (HALDOL) tablet 5 mg  5 mg Oral TID PRN White, Patrice L, NP       Or   haloperidol lactate (HALDOL) injection 5 mg  5 mg Intramuscular TID PRN White, Patrice L, NP       hydrOXYzine (ATARAX) tablet 25 mg  25 mg Oral TID PRN White,  Patrice L, NP   25 mg at 07/09/22 2050   ibuprofen (ADVIL) tablet 400 mg  400 mg Oral Q6H PRN Bobbitt, Shalon E, NP       lamoTRIgine (LAMICTAL) tablet 25 mg  25 mg Oral Q12H Massengill, Harrold Donath, MD   25 mg at 07/10/22 0814   LORazepam (ATIVAN) tablet 2 mg  2 mg Oral TID PRN White, Patrice L, NP       Or   LORazepam (ATIVAN) injection 2 mg  2 mg Intramuscular TID PRN White, Patrice L, NP       magnesium hydroxide (MILK OF MAGNESIA) suspension 30 mL  30 mL Oral Daily PRN White, Patrice L, NP       nicotine polacrilex (NICORETTE) gum 2 mg  2 mg Oral PRN Rex Kras, MD   2 mg at 07/10/22 1424   risperiDONE (RISPERDAL M-TABS) disintegrating tablet 1 mg  1 mg Oral Daily Massengill, Nathan, MD   1 mg at 07/10/22 0813   risperiDONE (RISPERDAL M-TABS) disintegrating tablet 2 mg  2 mg Oral QHS Massengill, Nathan, MD   2 mg at 07/09/22 2050   traZODone (DESYREL) tablet 50 mg  50 mg Oral QHS PRN Starleen Blue, NP   50 mg at 07/09/22 2050   vitamin D3 (CHOLECALCIFEROL) tablet 1,000 Units  1,000 Units Oral Daily Starleen Blue, NP   1,000 Units at 07/10/22 0813    Lab Results:  No results found for this or any previous visit (from the past 48 hour(s)).   Blood Alcohol level:  Lab Results  Component Value Date   ETH <10 05/18/2022    Metabolic Disorder Labs: Lab Results  Component Value Date   HGBA1C 4.8 07/05/2022   MPG 91.06 07/05/2022   Lab Results  Component Value Date   PROLACTIN 17.3 05/18/2022   Lab Results  Component Value Date   CHOL 131 07/05/2022   TRIG 40 07/05/2022   HDL 56 07/05/2022   CHOLHDL 2.3 07/05/2022   VLDL 8 07/05/2022   LDLCALC 67 07/05/2022   LDLCALC 66 03/15/2015    Physical Findings: AIMS:  , ,  ,  ,    CIWA:    COWS:     Musculoskeletal: Strength & Muscle Tone: within normal limits Gait & Station: normal Patient leans: N/A  Psychiatric Specialty Exam:  Presentation  General Appearance:  Appropriate for Environment; Fairly Groomed  Eye  Contact: Good  Speech: Clear and Coherent  Speech Volume: Normal  Handedness: Right   Mood and Affect  Mood: Less overwhelmed  Affect: Appropriate; Congruent   Thought Process  Thought Processes: Coherent  Descriptions of Associations:Intact  Orientation:Full (Time, Place and Person)  Thought Content:Logical  History of Schizophrenia/Schizoaffective disorder:No  Duration of Psychotic Symptoms:N/A  Hallucinations:Hallucinations: None  Ideas of Reference:None  Suicidal Thoughts:Suicidal Thoughts: No  Homicidal Thoughts:Homicidal Thoughts: No   Sensorium  Memory: Immediate Good  Judgment: Fair  Insight: Fair   Art therapist  Concentration: Good  Attention Span: Good  Recall: Fair  Fund of Knowledge: Fair  Language: Good   Psychomotor Activity  Psychomotor Activity: Psychomotor Activity: Normal   Assets  Assets: Resilience; Social Support   Sleep  Sleep: Sleep: Good    Physical Exam: Physical Exam Constitutional:      Appearance: Normal appearance.  HENT:     Head: Normocephalic.     Nose: Nose normal. No congestion or rhinorrhea.  Pulmonary:     Effort: Pulmonary effort is normal.  Musculoskeletal:     Cervical back: Normal range of motion.  Neurological:     Mental Status: She is alert and oriented to person, place, and time.  Psychiatric:        Behavior: Behavior normal.    Review of Systems  Constitutional:  Negative for fever.  HENT:  Negative for hearing loss.   Eyes:  Negative for blurred vision.  Respiratory:  Positive for cough.   Cardiovascular:  Negative for chest pain.  Gastrointestinal:  Negative for heartburn.  Genitourinary:  Negative for dysuria.  Musculoskeletal:  Negative for myalgias.  Skin:  Negative for rash.  Neurological:  Negative for dizziness.  Psychiatric/Behavioral:  Positive for depression and substance abuse. Negative for hallucinations, memory loss and suicidal ideas.  The patient is nervous/anxious. The patient does  not have insomnia.    Blood pressure 120/88, pulse 89, temperature 98 F (36.7 C), temperature source Oral, resp. rate 14, height 5\' 7"  (1.702 m), weight 59.4 kg, SpO2 100 %. Body mass index is 20.52 kg/m.  Treatment Plan Summary: Daily contact with patient to assess and evaluate symptoms and progress in treatment and Medication management  Assessment: Diagnoses Principal Problem:   Schizoaffective disorder, bipolar type (HCC)   Delta-9-tetrahydrocannabinol (THC) dependence (HCC)   Insomnia   Tic disorder, unspecified   Plan: Safety and Monitoring: Voluntary admission to inpatient psychiatric unit for safety, stabilization and treatment Daily contact with patient to assess and evaluate symptoms and progress in treatment Patient's case to be discussed in multi-disciplinary team meeting Observation Level : q15 minute checks Vital signs: q12 hours Precautions: Safety    Medications: -Start Amoxicillin 500 mg Q 8 hrs for 4 days for tooth infection -Continue Lamictal 25 mg BID for mood stabilization d/t risk of SJS with restarting at high dose.   -Continue Risperdal 1 mg every morning and 2 mg nightly -  for psychosis/mood stabilization   -Continue guanfacine send 1 mg daily for trouble concentrating   -Continue trazodone 50 mg nightly for sleep   -Continue hydroxyzine 3 times daily as needed for anxiety   -Continue agitation protocol medications: Haldol/Benadryl/Ativan 3 times daily as needed every 6 hours   Other PRNS -Continue Tylenol 650 mg every 6 hours PRN for mild pain -Continue Maalox 30 mg every 4 hrs PRN for indigestion -Continue Milk of Magnesia as needed every 6 hrs for constipation  Discharge Planning: Social work and case management to assist with discharge planning and identification of hospital follow-up needs prior to discharge Estimated LOS: 5-7 days Discharge Concerns: Need to establish a safety plan;  Medication compliance and effectiveness Discharge Goals: Return home with outpatient referrals for mental health follow-up including medication management/psychotherapy   I certify that inpatient services furnished can reasonably be expected to improve the patient's condition.    Starleen Blue, NP 07/10/2022, 3:52 PM Patient ID: Jennifer Arias, female   DOB: 03-Jun-1990, 32 y.o.   MRN: [id number] Patient ID: Jennifer Birden, female   DOB: April 23, 1990, 32 y.o.   MRN: [id number]

## 2022-07-11 DIAGNOSIS — F250 Schizoaffective disorder, bipolar type: Secondary | ICD-10-CM | POA: Diagnosis not present

## 2022-07-11 MED ORDER — RISPERIDONE 3 MG PO TBDP: 3.0000 mg | ORAL_TABLET | Freq: Every day | ORAL | 0 refills | Status: DC

## 2022-07-11 MED ORDER — NICOTINE POLACRILEX 2 MG MT GUM: 2.0000 mg | CHEWING_GUM | OROMUCOSAL | 0 refills | Status: DC | PRN

## 2022-07-11 MED ORDER — LAMOTRIGINE 25 MG PO TABS: 25.0000 mg | ORAL_TABLET | Freq: Two times a day (BID) | ORAL | 0 refills | Status: DC

## 2022-07-11 MED ORDER — VITAMIN D3 25 MCG PO TABS: 1000.0000 [IU] | ORAL_TABLET | Freq: Every day | ORAL | 0 refills | Status: DC

## 2022-07-11 MED ORDER — GUANFACINE HCL ER 1 MG PO TB24: 1.0000 mg | ORAL_TABLET | Freq: Every day | ORAL | 0 refills | Status: DC

## 2022-07-11 MED ORDER — AMOXICILLIN 500 MG PO CAPS: 500.0000 mg | ORAL_CAPSULE | Freq: Three times a day (TID) | ORAL | 0 refills | Status: DC

## 2022-07-11 MED ORDER — TRAZODONE HCL 50 MG PO TABS: 50.0000 mg | ORAL_TABLET | Freq: Every evening | ORAL | 0 refills | Status: DC | PRN

## 2022-07-11 MED ORDER — HYDROXYZINE HCL 25 MG PO TABS: 25.0000 mg | ORAL_TABLET | Freq: Three times a day (TID) | ORAL | 0 refills | Status: DC | PRN

## 2022-07-11 NOTE — Discharge Summary (Signed)
Physician Discharge Summary Note  Patient:  Jennifer York is an 32 y.o., female MRN:  [id number] DOB:  1991/01/23 Patient phone:  [phone] (home)  [place]   [address] [place] [address],  Total Time spent with patient: 45 minutes  Date of Admission:  07/01/2022 Date of Discharge: 07/11/2022  Reason for Admission: Pt is a 32 y.o. female presenting to [facility] under IVC from [facility] petitioned by her mother Jennifer York) and IVC paperwork states that "Respondent has been diagnosed with major depressive disorder, psychosis, and an altered mental status. She refused to take her prescribed medication and extremely combative towards family members. She has threatened to kill herself, mother, father, and brother. Her mother had to hide all sharp objects in the home after she found her rubbing a knife up and down her arm. She is not sleeping nor eating and is unable to care for herself. Respondent is in need of a mental health evaluation."  Patient was involuntarily transferred to this [facility] on 07/01/22 for treatment and stabilization of her mental status.   Principal Problem: Schizoaffective disorder, bipolar type [facility]) Discharge Diagnoses: Principal Problem:   Schizoaffective disorder, bipolar type (HCC) Active Problems:   Delta-9-tetrahydrocannabinol (THC) dependence (HCC)   Insomnia   Tic disorder, unspecified  Past Psychiatric History: See H & P  Past Medical History:  Past Medical History:  Diagnosis Date   Family history of adverse reaction to anesthesia    father has woken up during surgery   Medical history non-contributory     Past Surgical History:  Procedure Laterality Date   DILATION AND EVACUATION N/A 12/24/2019   Procedure: SUCTION DILATION AND EVACUATION;  Surgeon: Reva Bores, MD;  Location: [facility];  Service: Gynecology;  Laterality: N/A;   Family History:  Family History  Problem Relation Age of Onset   Depression  Mother    Depression Father    Depression Brother    Family Psychiatric  History: See H & P Social History:  Social History   Substance and Sexual Activity  Alcohol Use Yes   Comment: drinks 'occasionally'     Social History   Substance and Sexual Activity  Drug Use Yes   Types: Marijuana   Comment: smokes weed 'occasionally'    Social History   Socioeconomic History   Marital status: Single    Spouse name: Not on file   Number of children: Not on file   Years of education: Not on file   Highest education level: Not on file  Occupational History   Not on file  Tobacco Use   Smoking status: Every Day    Packs/day: 1    Types: Cigarettes   Smokeless tobacco: Current    Types: Chew  Vaping Use   Vaping Use: Every day   Substances: Nicotine  Substance and Sexual Activity   Alcohol use: Yes    Comment: drinks 'occasionally'   Drug use: Yes    Types: Marijuana    Comment: smokes weed 'occasionally'   Sexual activity: Yes    Birth control/protection: None  Other Topics Concern   Not on file  Social History Narrative   Not on file   Social Determinants of Health   Financial Resource Strain: Not on file  Food Insecurity: Patient Declined (07/01/2022)   Hunger Vital Sign    Worried About Running Out of Food in the Last Year: Patient declined    Ran Out of Food in the Last Year: Patient  declined  Transportation Needs: Patient Declined (07/01/2022)   PRAPARE - Administrator, Civil Service (Medical): Patient declined    Lack of Transportation (Non-Medical): Patient declined  Physical Activity: Not on file  Stress: Not on file  Social Connections: Not on file   Hospital Course:   During the patient's hospitalization, patient had extensive initial psychiatric evaluation, and follow-up psychiatric evaluations every day. Psychiatric diagnoses provided upon initial assessment are as listed above. Patient's psychiatric medications were adjusted on admission as  follows: -Start Lamictal 100 mg daily for mood stabilization-educated on this medication including the risks of Stevens-Johnson's with poor compliance, persistent on restarting this medication, which is her home medication  -Started Risperdal M tabs 1 mg twice daily for psychosis/mood stabilization  -Started guanfacine send 1 mg daily for trouble concentrating  -Started trazodone 50 mg nightly for sleep  -Started hydroxyzine 3 times daily as needed for anxiety  -Continued agitation protocol medications: Haldol/Benadryl/Ativan 3 times daily as needed every 6 hours   During the hospitalization, other adjustments were made to the patient's psychiatric medication regimen. Medications at discharge are as follows:   -Continue Amoxicillin 500 mg Q 8 hrs for 3 days for tooth infection, then stop. Educated on the need to make an appointment and see a dentist immediately after discharge, and verbalizes understanding.   -Continue Lamictal 25 mg BID for mood stabilization d/t risk of SJS with restarting at high dose.-Outpatient provider to titrate medication upwards as needed.   -Continue Risperdal M-tabs 3 mg nightly -  for psychosis/mood stabilization  -Continue guanfacine send 1 mg daily for trouble concentrating  -Continue trazodone 50 mg nightly for sleep  -Continue hydroxyzine 3 times daily as needed for anxiety   Patient's care was discussed during the interdisciplinary team meeting every day during the hospitalization. The patient denies having side effects to prescribed psychiatric medication. Gradually, patient started adjusting to milieu. The patient was evaluated each day by a clinical provider to ascertain response to treatment. Improvement was noted by the patient's report of decreasing symptoms, improved sleep and appetite, affect, medication tolerance, behavior, and participation in unit programming.  Patient was asked each day to complete a self inventory noting mood, mental status, pain, new  symptoms, anxiety and concerns.     Symptoms were reported as significantly decreased or resolved completely by discharge. On day of discharge, the patient reports that their mood is stable. The patient denied having suicidal thoughts for more than 48 hours prior to discharge.  Patient denies having homicidal thoughts.  Patient denies having auditory hallucinations.  Patient denies any visual hallucinations or other symptoms of psychosis. The patient was motivated to continue taking medication with a goal of continued improvement in mental health.    The patient reports their target psychiatric symptoms of depression, anxiety, psychosis and insomnia responded well to the psychiatric medications, and the patient reports overall benefit other psychiatric hospitalization. Supportive psychotherapy was provided to the patient. The patient also participated in regular group therapy while hospitalized. Coping skills, problem solving as well as relaxation therapies were also part of the unit programming.   Labs were reviewed with the patient, and abnormal results were discussed with the patient.   The patient is able to verbalize their individual safety plan to this provider.   # It is recommended to the patient to continue psychiatric medications as prescribed, after discharge from the hospital.     # It is recommended to the patient to follow up with your outpatient  psychiatric provider and PCP.   # It was discussed with the patient, the impact of alcohol, drugs, tobacco have been there overall psychiatric and medical wellbeing, and total abstinence from substance use was recommended the patient.ed.   # Prescriptions provided or sent directly to preferred pharmacy at discharge. Patient agreeable to plan. Given opportunity to ask questions. Appears to feel comfortable with discharge.    # In the event of worsening symptoms, the patient is instructed to call the crisis hotline (988), 911 and or go to the  nearest ED for appropriate evaluation and treatment of symptoms. To follow-up with primary care provider for other medical issues, concerns and or health care needs   # Patient was discharged home with a plan to follow up as noted below.    Physical Findings: AIMS: 0 CIWA: n/a COWS: n/a  Musculoskeletal: Strength & Muscle Tone: within normal limits Gait & Station: normal Patient leans: N/A  Psychiatric Specialty Exam:  Presentation  General Appearance:  Appropriate for Environment  Eye Contact: Good  Speech: Clear and Coherent  Speech Volume: Normal  Handedness: Right   Mood and Affect  Mood: Euthymic  Affect: Congruent   Thought Process  Thought Processes: Coherent  Descriptions of Associations:Intact  Orientation:Full (Time, Place and Person)  Thought Content:Logical  History of Schizophrenia/Schizoaffective disorder:No  Duration of Psychotic Symptoms:N/A  Hallucinations:Hallucinations: None  Ideas of Reference:None  Suicidal Thoughts:Suicidal Thoughts: No  Homicidal Thoughts:Homicidal Thoughts: No   Sensorium  Memory: Immediate Good  Judgment: Fair  Insight: Fair   Art therapist  Concentration: Good  Attention Span: Good  Recall: Good  Fund of Knowledge: Fair  Language: Fair   Psychomotor Activity  Psychomotor Activity: Psychomotor Activity: Normal   Assets  Assets: Resilience; Social Support   Sleep  Sleep: Sleep: Good    Physical Exam: Physical Exam Review of Systems  Psychiatric/Behavioral:  Positive for depression (Denies SI/HI/AVH, denies any plan or intent to harm self or others outside of [facility]) and substance abuse. Negative for hallucinations, memory loss and suicidal ideas. The patient is nervous/anxious (Resolving) and has insomnia (Resolving).    Blood pressure 92/61, pulse 78, temperature 98.2 F (36.8 C), temperature source Oral, resp. rate 14, height 5\' 7"  (1.702 m), weight 59.4  kg, SpO2 98 %. Body mass index is 20.52 kg/m.   Social History   Tobacco Use  Smoking Status Every Day   Packs/day: 1   Types: Cigarettes  Smokeless Tobacco Current   Types: Chew   Tobacco Cessation:  A prescription for an FDA-approved tobacco cessation medication provided at discharge   Blood Alcohol level:  Lab Results  Component Value Date   [facility] <10 05/18/2022    Metabolic Disorder Labs:  Lab Results  Component Value Date   HGBA1C 4.8 07/05/2022   MPG 91.06 07/05/2022   Lab Results  Component Value Date   PROLACTIN 17.3 05/18/2022   Lab Results  Component Value Date   CHOL 131 07/05/2022   TRIG 40 07/05/2022   HDL 56 07/05/2022   CHOLHDL 2.3 07/05/2022   VLDL 8 07/05/2022   LDLCALC 67 07/05/2022   LDLCALC 66 03/15/2015    See Psychiatric Specialty Exam and Suicide Risk Assessment completed by Attending Physician prior to discharge.  Discharge destination:  Home  Is patient on multiple antipsychotic therapies at discharge:  No   Has Patient had three or more failed trials of antipsychotic monotherapy by history:  No  Recommended Plan for Multiple Antipsychotic Therapies: NA   Allergies  as of 07/11/2022   No Known Allergies      Medication List     STOP taking these medications    OLANZapine 5 MG tablet Commonly known as: ZYPREXA   venlafaxine 37.5 MG tablet Commonly known as: EFFEXOR       TAKE these medications      Indication  amoxicillin 500 MG capsule Commonly known as: AMOXIL Take 1 capsule (500 mg total) by mouth every 8 (eight) hours.  Indication: tooth infection   guanFACINE 1 MG Tb24 ER tablet Commonly known as: INTUNIV Take 1 tablet (1 mg total) by mouth daily. Start taking on: July 12, 2022  Indication: unspecified tic d/o as per patient   hydrOXYzine 25 MG tablet Commonly known as: ATARAX Take 1 tablet (25 mg total) by mouth 3 (three) times daily as needed for anxiety.  Indication: Feeling Anxious   lamoTRIgine  25 MG tablet Commonly known as: LAMICTAL Take 1 tablet (25 mg total) by mouth every 12 (twelve) hours. What changed:  medication strength how much to take when to take this  Indication: Manic-Depression   nicotine polacrilex 2 MG gum Commonly known as: NICORETTE Take 1 each (2 mg total) by mouth as needed for smoking cessation.  Indication: Nicotine Addiction   risperiDONE 3 MG disintegrating tablet Commonly known as: RISPERDAL M-TABS Take 1 tablet (3 mg total) by mouth at bedtime.  Indication: Manic Phase of Manic-Depression   traZODone 50 MG tablet Commonly known as: DESYREL Take 1 tablet (50 mg total) by mouth at bedtime as needed for sleep.  Indication: Trouble Sleeping   vitamin D3 25 MCG tablet Commonly known as: CHOLECALCIFEROL Take 1 tablet (1,000 Units total) by mouth daily. Start taking on: July 12, 2022  Indication: Vitamin D Deficiency        Follow-up Information     [facility]. Schedule an appointment as soon as possible for a visit.   Why: Please call to schedule an appointment with your provider Kerry Fort, as we have been unable to contact at either phone number. Contact information: [address], [place], [place] [address] Phone: [phone] or [address] [place], [place] [address]        [facility] Follow up.   Specialty: Behavioral Health Why: You may go to this provider on Monday through Friday, arrive by 7:20 am to obtain therapy and/or medication management services. Contact information: [address] [place] [address] [phone]        [facility] Go on 08/04/2022.   Why: You have an appointment for medication management services on  08/04/22 at 4:00 pm.  This appointment will be held in person. Contact information: [address] [place] [address] [phone]                Signed: Starleen Blue, NP 07/11/2022, 12:56 PM

## 2022-07-11 NOTE — Group Note (Signed)
Recreation Therapy Group Note   Group Topic:Other  Group Date: 07/11/2022 Start Time: 1047 End Time: 1144 Facilitators: Teniola Tseng-McCall, LRT,CTRS Location: [address] Dayroom   Goal Area(s) Addresses:  Patient will identify how music helps express emotions. Patient will successfully share benefits of healthy expression of emotions.     Group Description: Music Therapy.  LRT and patients discussed the importance of music and it's affect on individuals. Patients were then given the opportunity pick songs that ment something to them. LRT would play the songs requested by the patients as long as they were clean and appropriate. Patients would go on to share what those songs mean to them.    Affect/Mood: Appropriate   Participation Level: Engaged   Participation Quality: Independent   Behavior: Appropriate   Speech/Thought Process: Focused   Insight: Good   Judgement: Good   Modes of Intervention: Music   Patient Response to Interventions:  Engaged   Education Outcome:  Acknowledges education   Clinical Observations/Individualized Feedback: Pt came in late to group. Pt was able to join right in with the activity. Pt was bright and bobbing along with the music. Pt also expressed music makes you feel good and puts you in a good place.    Plan: Continue to engage patient in RT group sessions 2-3x/week.   Adora Yeh-McCall, LRT,CTRS 07/11/2022 12:43 PM

## 2022-07-11 NOTE — Progress Notes (Signed)
   07/11/22 0840  Psych Admission Type (Psych Patients Only)  Admission Status Involuntary  Psychosocial Assessment  Patient Complaints None  Eye Contact Fair  Facial Expression Animated  Affect Appropriate to circumstance  Speech Logical/coherent  Interaction Assertive  Motor Activity Other (Comment) (WDL)  Appearance/Hygiene Unremarkable  Behavior Characteristics Appropriate to situation  Mood Pleasant  Thought Process  Coherency WDL  Content WDL  Delusions None reported or observed  Perception WDL  Hallucination None reported or observed  Judgment WDL  Confusion None  Danger to Self  Current suicidal ideation? Denies  Agreement Not to Harm Self Yes  Description of Agreement verbal  Danger to Others  Danger to Others None reported or observed

## 2022-07-11 NOTE — Progress Notes (Signed)
  [facility] Adult Case Management Discharge Plan :  Will you be returning to the same living situation after discharge:  No. Patient will be staying with boyfriend, [name] At discharge, do you have transportation home?: Yes,  Boyfriend will be picking patient up Do you have the ability to pay for your medications: Yes,  insurance   Release of information consent forms completed and in the chart;  Patient's signature needed at discharge.  Patient to Follow up at:  Follow-up Information     [facility]. Schedule an appointment as soon as possible for a visit.   Why: Please call to schedule an appointment with your provider Kerry Fort, as we have been unable to contact at either phone number. Contact information: [address] [place], [place] [address] Phone: [phone] or [address] [place], [place] [address]        [facility] Follow up.   Specialty: Behavioral Health Why: You may go to this provider on Monday through Friday, arrive by 7:20 am to obtain therapy and/or medication management services. Contact information: [address] [place] [address] [phone]        [facility] Go on 08/04/2022.   Why: You have an appointment for medication management services on  08/04/22 at 4:00 pm.  This appointment will be held in person. Contact information: [address] [place] [address] [phone]                 Next level of care provider has access to [facility] Link:no  Safety Planning and Suicide Prevention discussed: Yes,  Friend and Boyfriend      Has patient been referred to the Quitline?: Patient refused referral for treatment  Patient has been referred for addiction treatment: Patient refused referral for treatment.  Jennifer York Jennifer Jaymar Loeber, LCSW 07/11/2022, 9:42 AM

## 2022-07-11 NOTE — [facility] Suicide Risk Assessment (Signed)
Suicide Risk Assessment  Discharge Assessment    [facility] Discharge Suicide Risk Assessment   Principal Problem: Schizoaffective disorder, bipolar type [facility]) Discharge Diagnoses: Principal Problem:   Schizoaffective disorder, bipolar type (HCC) Active Problems:   Delta-9-tetrahydrocannabinol (THC) dependence (HCC)   Insomnia   Tic disorder, unspecified  Reason For Admission: Pt is a 32 y.o. female presenting to [facility] under IVC from [facility] petitioned by her mother Caraline Zong) and IVC paperwork states that "Respondent has been diagnosed with major depressive disorder, psychosis, and an altered mental status. She refused to take her prescribed medication and extremely combative towards family members. She has threatened to kill herself, mother, father, and brother. Her mother had to hide all sharp objects in the home after she found her rubbing a knife up and down her arm. She is not sleeping nor eating and is unable to care for herself. Respondent is in need of a mental health evaluation."  Patient was involuntarily transferred to this [facility] on 07/01/22 for treatment and stabilization of her mental status.   Hospital Course: During the patient's hospitalization, patient had extensive initial psychiatric evaluation, and follow-up psychiatric evaluations every day. Psychiatric diagnoses provided upon initial assessment are as listed above. Patient's psychiatric medications were adjusted on admission as follows: -Start Lamictal 100 mg daily for mood stabilization-educated on this medication including the risks of Stevens-Johnson's with poor compliance, persistent on restarting this medication, which is her home medication  -Started Risperdal M tabs 1 mg twice daily for psychosis/mood stabilization  -Started guanfacine send 1 mg daily for trouble concentrating  -Started trazodone 50 mg nightly for sleep  -Started hydroxyzine 3 times daily as needed for anxiety  -Continued agitation protocol  medications: Haldol/Benadryl/Ativan 3 times daily as needed every 6 hours  During the hospitalization, other adjustments were made to the patient's psychiatric medication regimen. Medications at discharge are as follows:  -Continue Amoxicillin 500 mg Q 8 hrs for 3 days for tooth infection, then stop. Educated on the need to make an appointment and see a dentist immediately after discharge, and verbalizes understanding.  -Continue Lamictal 25 mg BID for mood stabilization d/t risk of SJS with restarting at high dose.-Outpatient provider to titrate medication upwards as needed.   -Continue Risperdal M-tabs 3 mg nightly -  for psychosis/mood stabilization  -Continue guanfacine send 1 mg daily for trouble concentrating  -Continue trazodone 50 mg nightly for sleep  -Continue hydroxyzine 3 times daily as needed for anxiety   Patient's care was discussed during the interdisciplinary team meeting every day during the hospitalization. The patient denies having side effects to prescribed psychiatric medication. Gradually, patient started adjusting to milieu. The patient was evaluated each day by a clinical provider to ascertain response to treatment. Improvement was noted by the patient's report of decreasing symptoms, improved sleep and appetite, affect, medication tolerance, behavior, and participation in unit programming.  Patient was asked each day to complete a self inventory noting mood, mental status, pain, new symptoms, anxiety and concerns.    Symptoms were reported as significantly decreased or resolved completely by discharge. On day of discharge, the patient reports that their mood is stable. The patient denied having suicidal thoughts for more than 48 hours prior to discharge.  Patient denies having homicidal thoughts.  Patient denies having auditory hallucinations.  Patient denies any visual hallucinations or other symptoms of psychosis. The patient was motivated to continue taking medication with  a goal of continued improvement in mental health.   The patient reports their  target psychiatric symptoms of depression, anxiety, psychosis and insomnia responded well to the psychiatric medications, and the patient reports overall benefit other psychiatric hospitalization. Supportive psychotherapy was provided to the patient. The patient also participated in regular group therapy while hospitalized. Coping skills, problem solving as well as relaxation therapies were also part of the unit programming.  Labs were reviewed with the patient, and abnormal results were discussed with the patient.  The patient is able to verbalize their individual safety plan to this provider.  # It is recommended to the patient to continue psychiatric medications as prescribed, after discharge from the hospital.    # It is recommended to the patient to follow up with your outpatient psychiatric provider and PCP.  # It was discussed with the patient, the impact of alcohol, drugs, tobacco have been there overall psychiatric and medical wellbeing, and total abstinence from substance use was recommended the patient.ed.  # Prescriptions provided or sent directly to preferred pharmacy at discharge. Patient agreeable to plan. Given opportunity to ask questions. Appears to feel comfortable with discharge.    # In the event of worsening symptoms, the patient is instructed to call the crisis hotline (988), 911 and or go to the nearest ED for appropriate evaluation and treatment of symptoms. To follow-up with primary care provider for other medical issues, concerns and or health care needs  # Patient was discharged home with a plan to follow up as noted below.   Total Time spent with patient: 45 minutes  Musculoskeletal: Strength & Muscle Tone: within normal limits Gait & Station: normal Patient leans: N/A  Psychiatric Specialty Exam  Presentation  General Appearance:  Appropriate for Environment  Eye  Contact: Good  Speech: Clear and Coherent  Speech Volume: Normal  Handedness: Right   Mood and Affect  Mood: Euthymic  Duration of Depression Symptoms: Greater than two weeks  Affect: Congruent   Thought Process  Thought Processes: Coherent  Descriptions of Associations:Intact  Orientation:Full (Time, Place and Person)  Thought Content:Logical  History of Schizophrenia/Schizoaffective disorder:No  Duration of Psychotic Symptoms:N/A  Hallucinations:Hallucinations: None  Ideas of Reference:None  Suicidal Thoughts:Suicidal Thoughts: No  Homicidal Thoughts:Homicidal Thoughts: No   Sensorium  Memory: Immediate Good  Judgment: Fair  Insight: Fair   Art therapist  Concentration: Good  Attention Span: Good  Recall: Good  Fund of Knowledge: Fair  Language: Fair   Psychomotor Activity  Psychomotor Activity: Psychomotor Activity: Normal   Assets  Assets: Resilience; Social Support   Sleep  Sleep: Sleep: Good  Physical Exam: Physical Exam Review of Systems  Constitutional:  Negative for fever.  HENT:  Negative for hearing loss.   Eyes:  Negative for blurred vision.  Respiratory:  Negative for cough.   Cardiovascular:  Negative for chest pain.  Gastrointestinal:  Negative for heartburn.  Genitourinary:  Negative for dysuria.  Musculoskeletal:  Negative for myalgias.  Skin:  Negative for rash.  Neurological:  Negative for dizziness.  Psychiatric/Behavioral:  Positive for depression (Denies SI/HI/AVH, denies any intent/plan to harm self or others outside of [facility]) and substance abuse (Educated on the negative impact of substance use on her mental health and educated on cessation). Negative for hallucinations, memory loss and suicidal ideas. The patient is nervous/anxious (Resolving) and has insomnia (Resolving).    Blood pressure 92/61, pulse 78, temperature 98.2 F (36.8 C), temperature source Oral, resp. rate 14,  height 5\' 7"  (1.702 m), weight 59.4 kg, SpO2 98 %. Body mass index is 20.52 kg/m.  Mental Status Per Nursing Assessment::   On Admission:  Suicidal ideation indicated by others  Demographic Factors:  Low socioeconomic status and Unemployed  Loss Factors: Financial problems/change in socioeconomic status  Historical Factors: Impulsivity  Risk Reduction Factors:   Living with another person, especially a relative, Positive social support, and Positive therapeutic relationship  Continued Clinical Symptoms:  Alcohol/Substance Abuse/Dependencies-Cannabis use   Cognitive Features That Contribute To Risk:  None    Suicide Risk:  Mild:  There are no identifiable suicide plans, no associated intent, mild dysphoria and related symptoms, good self-control (both objective and subjective assessment), few other risk factors, and identifiable protective factors, including available and accessible social support.    Follow-up Information     [facility]. Schedule an appointment as soon as possible for a visit.   Why: Please call to schedule an appointment with your provider Kerry Fort, as we have been unable to contact at either phone number. Contact information: [address], [place], [place] [address] Phone: [phone] or [address] [place], [place] [address]        [facility] Follow up.   Specialty: Behavioral Health Why: You may go to this provider on Monday through Friday, arrive by 7:20 am to obtain therapy and/or medication management services. Contact information: [address] [place] [address] [phone]        [facility] Go on 08/04/2022.   Why: You have an appointment for medication management services on  08/04/22 at 4:00 pm.  This appointment will be held in person. Contact information: [address] [place] [address] [phone]                Starleen Blue,  NP 07/11/2022, 9:36 AM

## 2022-07-11 NOTE — Progress Notes (Signed)
   07/11/22 0840  Charting Type  Charting Type Shift assessment  Safety Check Verification  Has the RN verified the 15 minute safety check completion? Yes  Neurological  Neuro (WDL) WDL  HEENT  HEENT (WDL) WDL  Respiratory  Respiratory (WDL) WDL  Cardiac  Cardiac (WDL) WDL  Vascular  Vascular (WDL) WDL  Integumentary  Integumentary (WDL) WDL  Braden Scale (Ages 8 and up)  Sensory Perceptions 4  Moisture 4  Activity 4  Mobility 4  Nutrition 3  Friction and Shear 3  Braden Scale Score 22  Musculoskeletal  Musculoskeletal (WDL) WDL  Assistive Device None  Gastrointestinal  Gastrointestinal (WDL) WDL  GU Assessment  Genitourinary (WDL) WDL  Neurological  Level of Consciousness Alert

## 2022-07-11 NOTE — Progress Notes (Signed)
Adult Psychoeducational Group Note  Date:  07/11/2022 Time:  10:14 AM  Group Topic/Focus:  Goals Group:   The focus of this group is to help patients establish daily goals to achieve during treatment and discuss how the patient can incorporate goal setting into their daily lives to aide in recovery.  Participation Level:  Active  Participation Quality:  Appropriate  Affect:  Appropriate  Cognitive:  Alert  Insight: Good  Engagement in Group:  Engaged  Modes of Intervention:  Education  Additional Comments:  Pt actively participated in group and was able to identify her goals after discharge  Burnett Sheng 07/11/2022, 10:14 AM

## 2022-07-11 NOTE — Progress Notes (Signed)
D: Pt A & O X 3. Denies SI, HI, AVH and pain at this time. D/C home as ordered. Picked up in lobby by "my partner". A: D/C instructions reviewed with pt including prescriptions and follow up appointment; compliance encouraged. All belongings from assigned locker returned to pt at time of departure. Scheduled medications given with verbal education and effects monitored. Safety checks maintained without incident till time of d/c.  R: Pt receptive to care. Compliant with medications when offered. Denies adverse drug reactions when assessed. Verbalized understanding related to d/c instructions. Signed belonging sheet in agreement with items received from locker. Ambulatory with a steady gait. Appears to be in no physical distress at time of departure.

## 2022-07-22 ENCOUNTER — Encounter (HOSPITAL_COMMUNITY): Payer: Self-pay

## 2022-07-22 ENCOUNTER — Telehealth: Payer: Medicaid Other | Admitting: Emergency Medicine

## 2022-07-22 ENCOUNTER — Other Ambulatory Visit: Payer: Self-pay

## 2022-07-22 ENCOUNTER — Encounter: Payer: Self-pay | Admitting: Emergency Medicine

## 2022-07-22 ENCOUNTER — Emergency Department (HOSPITAL_COMMUNITY)
Admission: EM | Admit: 2022-07-22 | Discharge: 2022-07-22 | Disposition: A | Discharge status: Home / Self Care | Payer: Medicaid Other | Attending: Emergency Medicine | Admitting: Emergency Medicine

## 2022-07-22 DIAGNOSIS — K0889 Other specified disorders of teeth and supporting structures: Secondary | ICD-10-CM | POA: Diagnosis present

## 2022-07-22 DIAGNOSIS — K029 Dental caries, unspecified: Secondary | ICD-10-CM | POA: Insufficient documentation

## 2022-07-22 DIAGNOSIS — K047 Periapical abscess without sinus: Secondary | ICD-10-CM

## 2022-07-22 DIAGNOSIS — R220 Localized swelling, mass and lump, head: Secondary | ICD-10-CM | POA: Diagnosis not present

## 2022-07-22 LAB — CBC WITH DIFFERENTIAL/PLATELET
Abs Immature Granulocytes: 0.02 10*3/uL (ref 0.00–0.07)
Basophils Absolute: 0 10*3/uL (ref 0.0–0.1)
Basophils Relative: 0 %
Eosinophils Absolute: 0.1 10*3/uL (ref 0.0–0.5)
Eosinophils Relative: 2 %
HCT: 36 % (ref 36.0–46.0)
Hemoglobin: 11.7 g/dL — ABNORMAL LOW (ref 12.0–15.0)
Immature Granulocytes: 0 %
Lymphocytes Relative: 31 %
Lymphs Abs: 2.2 10*3/uL (ref 0.7–4.0)
MCH: 30.7 pg (ref 26.0–34.0)
MCHC: 32.5 g/dL (ref 30.0–36.0)
MCV: 94.5 fL (ref 80.0–100.0)
Monocytes Absolute: 0.4 10*3/uL (ref 0.1–1.0)
Monocytes Relative: 6 %
Neutro Abs: 4.1 10*3/uL (ref 1.7–7.7)
Neutrophils Relative %: 61 %
Platelets: 202 10*3/uL (ref 150–400)
RBC: 3.81 MIL/uL — ABNORMAL LOW (ref 3.87–5.11)
RDW: 12.2 % (ref 11.5–15.5)
WBC: 6.9 10*3/uL (ref 4.0–10.5)
nRBC: 0 % (ref 0.0–0.2)

## 2022-07-22 LAB — BASIC METABOLIC PANEL
Anion gap: 5 (ref 5–15)
BUN: 10 mg/dL (ref 6–20)
CO2: 27 mmol/L (ref 22–32)
Calcium: 8.3 mg/dL — ABNORMAL LOW (ref 8.9–10.3)
Chloride: 109 mmol/L (ref 98–111)
Creatinine, Ser: 0.78 mg/dL (ref 0.44–1.00)
GFR, Estimated: >60 mL/min (ref >60)
Glucose, Bld: 82 mg/dL (ref 70–99)
Potassium: 4 mmol/L (ref 3.5–5.1)
Sodium: 141 mmol/L (ref 135–145)

## 2022-07-22 MED ORDER — OXYCODONE-ACETAMINOPHEN 5-325 MG PO TABS: 1.0000 | ORAL_TABLET | Freq: Four times a day (QID) | ORAL | 0 refills | Status: DC | PRN

## 2022-07-22 MED ORDER — OXYCODONE-ACETAMINOPHEN 5-325 MG PO TABS
1.0000 | ORAL_TABLET | Freq: Once | ORAL | Status: AC
  Administered 2022-07-22: 1 via ORAL
  Filled 2022-07-22: qty 1

## 2022-07-22 MED ORDER — CLINDAMYCIN HCL 150 MG PO CAPS: 450.0000 mg | ORAL_CAPSULE | Freq: Three times a day (TID) | ORAL | 0 refills | Status: AC

## 2022-07-22 NOTE — ED Notes (Signed)
Called pt multiples times and unable to find. Also attempted to contact her on cell number in chart with no answer.

## 2022-07-22 NOTE — Patient Instructions (Signed)
  Jennifer Rump, thank you for joining [place], PA-C for today's virtual visit.  While this provider is not your primary care provider (PCP), if your PCP is located in our provider database this encounter information will be shared with them immediately following your visit.   A [facility] MyChart account gives you access to today's visit and all your visits, tests, and labs performed at [facility] " click here if you don't have a [facility] MyChart account or go to [email or web address]  Consent: (Patient) Jennifer York provided verbal consent for this virtual visit at the beginning of the encounter.  Current Medications:  Current Outpatient Medications:    amoxicillin (AMOXIL) 500 MG capsule, Take 1 capsule (500 mg total) by mouth every 8 (eight) hours., Disp: 10 capsule, Rfl: 0   guanFACINE (INTUNIV) 1 MG TB24 ER tablet, Take 1 tablet (1 mg total) by mouth daily., Disp: 30 tablet, Rfl: 0   hydrOXYzine (ATARAX) 25 MG tablet, Take 1 tablet (25 mg total) by mouth 3 (three) times daily as needed for anxiety., Disp: 30 tablet, Rfl: 0   lamoTRIgine (LAMICTAL) 25 MG tablet, Take 1 tablet (25 mg total) by mouth every 12 (twelve) hours., Disp: 60 tablet, Rfl: 0   nicotine polacrilex (NICORETTE) 2 MG gum, Take 1 each (2 mg total) by mouth as needed for smoking cessation., Disp: 100 tablet, Rfl: 0   risperiDONE (RISPERDAL M-TABS) 3 MG disintegrating tablet, Take 1 tablet (3 mg total) by mouth at bedtime., Disp: 30 tablet, Rfl: 0   traZODone (DESYREL) 50 MG tablet, Take 1 tablet (50 mg total) by mouth at bedtime as needed for sleep., Disp: 30 tablet, Rfl: 0   vitamin D3 (CHOLECALCIFEROL) 25 MCG tablet, Take 1 tablet (1,000 Units total) by mouth daily., Disp: 5 tablet, Rfl: 0   Medications ordered in this encounter:  No orders of the defined types were placed in this encounter.    *If you need refills on other medications prior to your next appointment, please contact your  pharmacy*  Follow-Up:  Recommending further evaluation and management in the ED for dental infection associated with left sided facial swelling and failing outpatient antibiotic treatment.  Patient is aware and in agreement with plan.  Has transportation to the ED.  Declines EMS transport.     If you have been instructed to have an in-person evaluation today at a local Urgent Care facility, please use the link below. It will take you to a list of all of our available [facility], including address, phone number and hours of operation. Please do not delay care.  [facility]  If you or a family member do not have a primary care provider, use the link below to schedule a visit and establish care. When you choose a [facility] primary care physician or advanced practice provider, you gain a long-term partner in health. Find a Primary Care Provider  Learn more about [facility] in-office and virtual care options: [facility] - Get Care Now

## 2022-07-22 NOTE — Progress Notes (Signed)
Virtual Visit Consent   Jennifer York, you are scheduled for a virtual visit with a [facility] provider today. Just as with appointments in the office, your consent must be obtained to participate. Your consent will be active for this visit and any virtual visit you may have with one of our providers in the next 365 days. If you have a MyChart account, a copy of this consent can be sent to you electronically.  As this is a virtual visit, video technology does not allow for your provider to perform a traditional examination. This may limit your provider's ability to fully assess your condition. If your provider identifies any concerns that need to be evaluated in person or the need to arrange testing (such as labs, EKG, etc.), we will make arrangements to do so. Although advances in technology are sophisticated, we cannot ensure that it will always work on either your end or our end. If the connection with a video visit is poor, the visit may have to be switched to a telephone visit. With either a video or telephone visit, we are not always able to ensure that we have a secure connection.  By engaging in this virtual visit, you consent to the provision of healthcare and authorize for your insurance to be billed (if applicable) for the services provided during this visit. Depending on your insurance coverage, you may receive a charge related to this service.  I need to obtain your verbal consent now. Are you willing to proceed with your visit today? Jennifer York has provided verbal consent on 07/22/2022 for a virtual visit (video or telephone). Rennis Harding, [place]  Date: 07/22/2022 12:02 PM  Virtual Visit via Video Note   I, Rennis Harding, connected with  Jennifer York  ([id number], 10-Nov-1990) on 07/22/22 at 11:45 AM EDT by a video-enabled telemedicine application and verified that I am speaking with the correct person using two identifiers.  Location: Patient: Virtual Visit Location Patient:  Home Provider: Virtual Visit Location Provider: Home Office   I discussed the limitations of evaluation and management by telemedicine and the availability of in person appointments. The patient expressed understanding and agreed to proceed.    History of Present Illness: Jennifer York is a 32 y.o. who identifies as a female who was assigned female at birth, and is being seen today for dental pain x 1.5 weeks with associated numbness, and swelling over LT side of face.  Reports tooth abscess to LEFT lower back molar.  Was seen in person by her dentist and prescribed amoxicillin x 5 days.  Reports symptoms worsened, and was reevaluated by dentist and prescribed augmentin x 5 days. States symptoms still have not improved and she continues to have swelling, numbness, and discomfort.  Denies similar symptoms in the past.  Reports some pressure with swallowing, but tolerating own secretions and liquids.  Denies fever, chills, dyspnea, throat swelling/ tightness/ numbness, nausea, vomiting, chest pain, SOB, dysphagia.    ROS: As per HPI.  All other pertinent ROS negative.     Patient Active Problem List   Diagnosis Date Noted   Schizoaffective disorder, bipolar type (HCC) 07/02/2022   Delta-9-tetrahydrocannabinol (THC) dependence (HCC) 07/02/2022   Insomnia 07/02/2022   Tic disorder, unspecified 07/02/2022   Retained products of conception following abortion 12/23/2019   Major depressive disorder, recurrent, unspecified (HCC) 03/15/2015    Allergies: No Known Allergies Medications:  Current Outpatient Medications:    amoxicillin (AMOXIL) 500 MG capsule, Take 1 capsule (500 mg total) by mouth  every 8 (eight) hours., Disp: 10 capsule, Rfl: 0   guanFACINE (INTUNIV) 1 MG TB24 ER tablet, Take 1 tablet (1 mg total) by mouth daily., Disp: 30 tablet, Rfl: 0   hydrOXYzine (ATARAX) 25 MG tablet, Take 1 tablet (25 mg total) by mouth 3 (three) times daily as needed for anxiety., Disp: 30 tablet, Rfl: 0    lamoTRIgine (LAMICTAL) 25 MG tablet, Take 1 tablet (25 mg total) by mouth every 12 (twelve) hours., Disp: 60 tablet, Rfl: 0   nicotine polacrilex (NICORETTE) 2 MG gum, Take 1 each (2 mg total) by mouth as needed for smoking cessation., Disp: 100 tablet, Rfl: 0   risperiDONE (RISPERDAL M-TABS) 3 MG disintegrating tablet, Take 1 tablet (3 mg total) by mouth at bedtime., Disp: 30 tablet, Rfl: 0   traZODone (DESYREL) 50 MG tablet, Take 1 tablet (50 mg total) by mouth at bedtime as needed for sleep., Disp: 30 tablet, Rfl: 0   vitamin D3 (CHOLECALCIFEROL) 25 MCG tablet, Take 1 tablet (1,000 Units total) by mouth daily., Disp: 5 tablet, Rfl: 0  Observations/Objective: Patient is well-developed, well-nourished in no acute distress.  Resting comfortably  at home.  Head is normocephalic, atraumatic. Some obvious LT sided facial swelling is appreciated No labored breathing. Speaking in full sentences and tolerating own secretions Speech is clear and coherent with logical content.  Patient is alert and oriented at baseline.   Assessment and Plan: 1. Left facial swelling  2. Dental infection  Recommending further evaluation and management in the ED for dental infection associated with left sided facial swelling and failing outpatient antibiotic treatment.  Patient is aware and in agreement with plan.  Has transportation to the ED.  Declines EMS transport.     Time:  I spent 5-10 minutes with the patient via telehealth technology discussing the above problems/concerns.    Rennis Harding, PA-C

## 2022-07-22 NOTE — ED Notes (Signed)
Pt seen back in lobby with other. When ask where the pt was when trying to locate her she stated "around"

## 2022-07-22 NOTE — ED Provider Notes (Signed)
[place] EMERGENCY DEPARTMENT AT [facility] Provider Note   CSN: [id number] Arrival date & time: 07/22/22  1302     History  Chief Complaint  Patient presents with   Facial Swelling   Dental Pain    Jennifer York is a 32 y.o. female who presents to the ER complaining of facial pain and swelling x 1.5 weeks. Worse in left lower teeth. Pain radiates to L ear with associated numbness. Reports having taken two courses of antibiotics (amoxicillin, augmentin), without relief. Finishing second course now. No fever, difficulty opening mouth, difficulty or pain with swallowing.    Dental Pain      Home Medications Prior to Admission medications   Medication Sig Start Date End Date Taking? Authorizing Provider  clindamycin (CLEOCIN) 150 MG capsule Take 3 capsules (450 mg total) by mouth 3 (three) times daily for 7 days. 07/22/22 07/29/22 Yes Trenisha Lafavor T, PA-C  oxyCODONE-acetaminophen (PERCOCET/ROXICET) 5-325 MG tablet Take 1 tablet by mouth every 6 (six) hours as needed for severe pain. 07/22/22  Yes Abad Manard T, PA-C  amoxicillin (AMOXIL) 500 MG capsule Take 1 capsule (500 mg total) by mouth every 8 (eight) hours. 07/11/22   Starleen Blue, NP  guanFACINE (INTUNIV) 1 MG TB24 ER tablet Take 1 tablet (1 mg total) by mouth daily. 07/12/22   Starleen Blue, NP  hydrOXYzine (ATARAX) 25 MG tablet Take 1 tablet (25 mg total) by mouth 3 (three) times daily as needed for anxiety. 07/11/22   Starleen Blue, NP  lamoTRIgine (LAMICTAL) 25 MG tablet Take 1 tablet (25 mg total) by mouth every 12 (twelve) hours. 07/11/22   Starleen Blue, NP  nicotine polacrilex (NICORETTE) 2 MG gum Take 1 each (2 mg total) by mouth as needed for smoking cessation. 07/11/22   Starleen Blue, NP  risperiDONE (RISPERDAL M-TABS) 3 MG disintegrating tablet Take 1 tablet (3 mg total) by mouth at bedtime. 07/11/22   Starleen Blue, NP  traZODone (DESYREL) 50 MG tablet Take 1 tablet (50 mg total) by mouth at  bedtime as needed for sleep. 07/11/22   Starleen Blue, NP  vitamin D3 (CHOLECALCIFEROL) 25 MCG tablet Take 1 tablet (1,000 Units total) by mouth daily. 07/12/22   Starleen Blue, NP      Allergies    Patient has no known allergies.    Review of Systems   Review of Systems  HENT:  Positive for dental problem.   All other systems reviewed and are negative.   Physical Exam Updated Vital Signs BP 113/70 (BP Location: Right Arm)   Pulse 63   Temp 98.3 F (36.8 C) (Oral)   Resp 16   SpO2 100%  Physical Exam Vitals and nursing note reviewed.  Constitutional:      Appearance: Normal appearance.  HENT:     Head: Normocephalic and atraumatic.     Mouth/Throat:     Lips: Pink.     Mouth: Mucous membranes are moist.     Dentition: Abnormal dentition. Dental caries present.     Pharynx: Oropharynx is clear.     Comments: Multiple dental caries, left lower molar with significant decay, no periapical abscess. No trismus, tonsillar swelling, sublingual or significant submandibular swelling. Left sided cervical adenopathy Eyes:     Conjunctiva/sclera: Conjunctivae normal.  Pulmonary:     Effort: Pulmonary effort is normal. No respiratory distress.  Lymphadenopathy:     Cervical: Cervical adenopathy present.     Left cervical: Superficial cervical adenopathy present.  Skin:  General: Skin is warm and dry.  Neurological:     Mental Status: She is alert.  Psychiatric:        Mood and Affect: Mood normal.        Behavior: Behavior normal.     ED Results / Procedures / Treatments   Labs (all labs ordered are listed, but only abnormal results are displayed) Labs Reviewed  CBC WITH DIFFERENTIAL/PLATELET - Abnormal; Notable for the following components:      Result Value   RBC 3.81 (*)    Hemoglobin 11.7 (*)    All other components within normal limits  BASIC METABOLIC PANEL - Abnormal; Notable for the following components:   Calcium 8.3 (*)    All other components within normal  limits    EKG None  Radiology No results found.  Procedures Procedures    Medications Ordered in ED Medications  oxyCODONE-acetaminophen (PERCOCET/ROXICET) 5-325 MG per tablet 1 tablet (has no administration in time range)    ED Course/ Medical Decision Making/ A&P                             Medical Decision Making  This patient is a 32 y.o. female  who presents to the ED for concern of dental pain.   Differential diagnoses prior to evaluation: The emergent differential diagnosis includes, but is not limited to,  Dental fracture, dental caries, periapical abscess, gingival disease, peritonsillar abscess, ludwig's angina. This is not an exhaustive differential.   Past Medical History / Co-morbidities / Social History: Depression, schizoaffective disorder, THC dependence, tic disorder, hashimoto's disease  Additional history: Chart reviewed. Pertinent results include: reviewed televisit from earlier today, provider recommended patient be evaluated in ER for management of dental infection with left sided facial swelling having failed outpatient antibiotic treatment.   Physical Exam: Physical exam performed. The pertinent findings include: Significant tooth decay, worse to left lower molar where patient complaining of pain. No trismus. No evidence of PTA or ludwig's angina. No evidence of deep space infection.  Medications: I ordered medication including percocet.  I have reviewed the patients home medicines and have made adjustments as needed.   Disposition: After consideration of the diagnostic results and the patients response to treatment, I feel that emergency department workup does not suggest an emergent condition requiring admission or immediate intervention beyond what has been performed at this time. The plan is: discharge to home with prescription for clindamycin and percocet for break through pain. Patient has follow up appointment with dentist scheduled soon. No  evidence of deep space infection requiring further workup at this time. Given close return precautions. The patient is safe for discharge and has been instructed to return immediately for worsening symptoms, change in symptoms or any other concerns.  Final Clinical Impression(s) / ED Diagnoses Final diagnoses:  Dental infection    Rx / DC Orders ED Discharge Orders          Ordered    clindamycin (CLEOCIN) 150 MG capsule  3 times daily        07/22/22 1526    oxyCODONE-acetaminophen (PERCOCET/ROXICET) 5-325 MG tablet  Every 6 hours PRN        07/22/22 1526           Portions of this report may have been transcribed using voice recognition software. Every effort was made to ensure accuracy; however, inadvertent computerized transcription errors may be present.    La Dibella T, PA-C  07/22/22 1530    Terrilee Files, MD 07/23/22 1054

## 2022-07-22 NOTE — Discharge Instructions (Signed)
You were seen in the emergency department for dental pain.  As we discussed I think your pain is likely related to an infected tooth. We normally treat this with anti-inflammatories (ibuprofen and tylenol), prescribed pain medication, and antibiotics.   Please use acetaminophen (Tylenol) or ibuprofen (Advil, Motrin) for pain primarily.  You may use 800 mg ibuprofen every 6 hours or 1000 mg of acetaminophen every 6 hours.  You may choose to alternate between the two, this would be most effective. Do not exceed 4000 mg of acetaminophen within 24 hours.  Do not exceed 3200 mg ibuprofen within 24 hours.  I've attached a resource guide with several dentists in the area. It's incredibly important you follow up with a dentist as soon as possible for definitive treatment.   Continue to monitor how you're doing and return to the ER for new or worsening symptoms such as difficulty swallowing your own saliva, difficulty breathing, or fever.

## 2022-07-22 NOTE — ED Triage Notes (Signed)
Pt c/o increasing facial pain and swelling x2 weeks.  Pain score 4/10.  Pt reports L ear feels "numb."  Pt reports taking 2 courses of antibiotics w/o relief.  Currently on 2nd course.  Pt reports having a rotten tooth on L lower side of jaw.

## 2022-08-30 ENCOUNTER — Encounter (HOSPITAL_COMMUNITY): Payer: Self-pay | Admitting: Behavioral Health

## 2022-08-30 ENCOUNTER — Inpatient Hospital Stay (HOSPITAL_COMMUNITY)
Admission: AD | Admit: 2022-08-30 | Discharge: 2022-09-07 | DRG: 885 | Disposition: A | Discharge status: Home / Self Care | Payer: Medicaid Other | Source: Intra-hospital | Attending: Psychiatry | Admitting: Psychiatry

## 2022-08-30 ENCOUNTER — Other Ambulatory Visit: Payer: Self-pay

## 2022-08-30 ENCOUNTER — Ambulatory Visit (HOSPITAL_COMMUNITY)
Admission: EM | Admit: 2022-08-30 | Discharge: 2022-08-30 | Disposition: A | Discharge status: Other Acute Inpatient Hospital | Payer: Medicaid Other | Attending: Behavioral Health | Admitting: Behavioral Health

## 2022-08-30 DIAGNOSIS — Z560 Unemployment, unspecified: Secondary | ICD-10-CM | POA: Diagnosis not present

## 2022-08-30 DIAGNOSIS — I959 Hypotension, unspecified: Secondary | ICD-10-CM | POA: Diagnosis not present

## 2022-08-30 DIAGNOSIS — E063 Autoimmune thyroiditis: Secondary | ICD-10-CM | POA: Diagnosis present

## 2022-08-30 DIAGNOSIS — Z9152 Personal history of nonsuicidal self-harm: Secondary | ICD-10-CM | POA: Diagnosis not present

## 2022-08-30 DIAGNOSIS — R45851 Suicidal ideations: Secondary | ICD-10-CM | POA: Diagnosis present

## 2022-08-30 DIAGNOSIS — F12259 Cannabis dependence with psychotic disorder, unspecified: Secondary | ICD-10-CM | POA: Diagnosis present

## 2022-08-30 DIAGNOSIS — T43595A Adverse effect of other antipsychotics and neuroleptics, initial encounter: Secondary | ICD-10-CM | POA: Diagnosis not present

## 2022-08-30 DIAGNOSIS — Y92239 Unspecified place in hospital as the place of occurrence of the external cause: Secondary | ICD-10-CM | POA: Diagnosis not present

## 2022-08-30 DIAGNOSIS — Z79890 Hormone replacement therapy (postmenopausal): Secondary | ICD-10-CM | POA: Diagnosis not present

## 2022-08-30 DIAGNOSIS — F1223 Cannabis dependence with withdrawal: Secondary | ICD-10-CM | POA: Diagnosis present

## 2022-08-30 DIAGNOSIS — F332 Major depressive disorder, recurrent severe without psychotic features: Secondary | ICD-10-CM

## 2022-08-30 DIAGNOSIS — Z6822 Body mass index (BMI) 22.0-22.9, adult: Secondary | ICD-10-CM | POA: Diagnosis not present

## 2022-08-30 DIAGNOSIS — Z818 Family history of other mental and behavioral disorders: Secondary | ICD-10-CM

## 2022-08-30 DIAGNOSIS — F17210 Nicotine dependence, cigarettes, uncomplicated: Secondary | ICD-10-CM | POA: Diagnosis present

## 2022-08-30 DIAGNOSIS — R5383 Other fatigue: Secondary | ICD-10-CM | POA: Diagnosis not present

## 2022-08-30 DIAGNOSIS — Z79899 Other long term (current) drug therapy: Secondary | ICD-10-CM | POA: Diagnosis not present

## 2022-08-30 DIAGNOSIS — G4700 Insomnia, unspecified: Secondary | ICD-10-CM | POA: Diagnosis present

## 2022-08-30 DIAGNOSIS — E039 Hypothyroidism, unspecified: Secondary | ICD-10-CM | POA: Diagnosis present

## 2022-08-30 DIAGNOSIS — F250 Schizoaffective disorder, bipolar type: Secondary | ICD-10-CM | POA: Insufficient documentation

## 2022-08-30 DIAGNOSIS — Z9151 Personal history of suicidal behavior: Secondary | ICD-10-CM

## 2022-08-30 DIAGNOSIS — E860 Dehydration: Secondary | ICD-10-CM | POA: Diagnosis not present

## 2022-08-30 DIAGNOSIS — F411 Generalized anxiety disorder: Secondary | ICD-10-CM | POA: Diagnosis present

## 2022-08-30 DIAGNOSIS — R9431 Abnormal electrocardiogram [ECG] [EKG]: Secondary | ICD-10-CM | POA: Insufficient documentation

## 2022-08-30 LAB — CBC WITH DIFFERENTIAL/PLATELET
Abs Immature Granulocytes: 0.03 10*3/uL (ref 0.00–0.07)
Basophils Absolute: 0 10*3/uL (ref 0.0–0.1)
Basophils Relative: 0 %
Eosinophils Absolute: 0 10*3/uL (ref 0.0–0.5)
Eosinophils Relative: 0 %
HCT: 40.2 % (ref 36.0–46.0)
Hemoglobin: 14 g/dL (ref 12.0–15.0)
Immature Granulocytes: 0 %
Lymphocytes Relative: 17 %
Lymphs Abs: 1.6 10*3/uL (ref 0.7–4.0)
MCH: 32.2 pg (ref 26.0–34.0)
MCHC: 34.8 g/dL (ref 30.0–36.0)
MCV: 92.4 fL (ref 80.0–100.0)
Monocytes Absolute: 0.4 10*3/uL (ref 0.1–1.0)
Monocytes Relative: 4 %
Neutro Abs: 7 10*3/uL (ref 1.7–7.7)
Neutrophils Relative %: 79 %
Platelets: 235 10*3/uL (ref 150–400)
RBC: 4.35 MIL/uL (ref 3.87–5.11)
RDW: 11.5 % (ref 11.5–15.5)
WBC: 9 10*3/uL (ref 4.0–10.5)
nRBC: 0 % (ref 0.0–0.2)

## 2022-08-30 LAB — POCT URINE DRUG SCREEN - MANUAL ENTRY (I-SCREEN)
POC Amphetamine UR: NOT DETECTED
POC Buprenorphine (BUP): NOT DETECTED
POC Cocaine UR: NOT DETECTED
POC Marijuana UR: NOT DETECTED
POC Methadone UR: NOT DETECTED
POC Methamphetamine UR: NOT DETECTED
POC Morphine: NOT DETECTED
POC Oxazepam (BZO): NOT DETECTED
POC Oxycodone UR: NOT DETECTED
POC Secobarbital (BAR): NOT DETECTED

## 2022-08-30 LAB — HEMOGLOBIN A1C
Hgb A1c MFr Bld: 5.3 % (ref 4.8–5.6)
Mean Plasma Glucose: 105.41 mg/dL

## 2022-08-30 LAB — LIPID PANEL
Cholesterol: 205 mg/dL — ABNORMAL HIGH (ref 0–200)
HDL: 86 mg/dL (ref >40)
LDL Cholesterol: 110 mg/dL — ABNORMAL HIGH (ref 0–99)
Total CHOL/HDL Ratio: 2.4 RATIO
Triglycerides: 43 mg/dL (ref <150)
VLDL: 9 mg/dL (ref 0–40)

## 2022-08-30 LAB — MAGNESIUM: Magnesium: 2 mg/dL (ref 1.7–2.4)

## 2022-08-30 LAB — COMPREHENSIVE METABOLIC PANEL WITH GFR
ALT: 15 U/L (ref 0–44)
AST: 19 U/L (ref 15–41)
Albumin: 4.5 g/dL (ref 3.5–5.0)
Alkaline Phosphatase: 56 U/L (ref 38–126)
Anion gap: 8 (ref 5–15)
BUN: 8 mg/dL (ref 6–20)
CO2: 28 mmol/L (ref 22–32)
Calcium: 9.6 mg/dL (ref 8.9–10.3)
Chloride: 102 mmol/L (ref 98–111)
Creatinine, Ser: 0.79 mg/dL (ref 0.44–1.00)
GFR, Estimated: >60 mL/min
Glucose, Bld: 133 mg/dL — ABNORMAL HIGH (ref 70–99)
Potassium: 3.7 mmol/L (ref 3.5–5.1)
Sodium: 138 mmol/L (ref 135–145)
Total Bilirubin: 1.1 mg/dL (ref 0.3–1.2)
Total Protein: 7.1 g/dL (ref 6.5–8.1)

## 2022-08-30 LAB — POC URINE PREG, ED: Preg Test, Ur: NEGATIVE

## 2022-08-30 LAB — TSH: TSH: 1.378 u[IU]/mL (ref 0.350–4.500)

## 2022-08-30 LAB — ETHANOL: Alcohol, Ethyl (B): <10 mg/dL (ref <10)

## 2022-08-30 LAB — POCT PREGNANCY, URINE: Preg Test, Ur: NEGATIVE

## 2022-08-30 MED ORDER — NICOTINE 21 MG/24HR TD PT24
21.0000 mg | MEDICATED_PATCH | Freq: Every day | TRANSDERMAL | Status: DC
  Administered 2022-08-30 – 2022-09-03 (×4): 21 mg via TRANSDERMAL
  Filled 2022-08-30 (×8): qty 1

## 2022-08-30 MED ORDER — HYDROXYZINE HCL 25 MG PO TABS
25.0000 mg | ORAL_TABLET | Freq: Three times a day (TID) | ORAL | Status: DC | PRN
  Administered 2022-08-30 – 2022-09-01 (×4): 25 mg via ORAL
  Filled 2022-08-30 (×4): qty 1

## 2022-08-30 MED ORDER — ACETAMINOPHEN 325 MG PO TABS: 650.0000 mg | ORAL_TABLET | Freq: Four times a day (QID) | ORAL | Status: DC | PRN

## 2022-08-30 MED ORDER — RISPERIDONE 1 MG PO TABS: 1.0000 mg | ORAL_TABLET | Freq: Every day | ORAL | Status: DC

## 2022-08-30 MED ORDER — LAMOTRIGINE 25 MG PO TABS: 25.0000 mg | ORAL_TABLET | Freq: Two times a day (BID) | ORAL | Status: DC

## 2022-08-30 MED ORDER — HALOPERIDOL 5 MG PO TABS: 5.0000 mg | ORAL_TABLET | Freq: Three times a day (TID) | ORAL | Status: DC | PRN

## 2022-08-30 MED ORDER — DIPHENHYDRAMINE HCL 25 MG PO CAPS: 50.0000 mg | ORAL_CAPSULE | Freq: Three times a day (TID) | ORAL | Status: DC | PRN

## 2022-08-30 MED ORDER — RISPERIDONE 1 MG PO TABS
1.0000 mg | ORAL_TABLET | Freq: Every day | ORAL | Status: DC
  Administered 2022-08-30: 1 mg via ORAL
  Filled 2022-08-30 (×4): qty 1

## 2022-08-30 MED ORDER — LORAZEPAM 2 MG/ML IJ SOLN: 2.0000 mg | Freq: Three times a day (TID) | INTRAMUSCULAR | Status: DC | PRN

## 2022-08-30 MED ORDER — TRAZODONE HCL 50 MG PO TABS: 50.0000 mg | ORAL_TABLET | Freq: Every evening | ORAL | Status: DC | PRN

## 2022-08-30 MED ORDER — HYDROXYZINE HCL 25 MG PO TABS: 25.0000 mg | ORAL_TABLET | Freq: Three times a day (TID) | ORAL | Status: DC | PRN

## 2022-08-30 MED ORDER — ALUM & MAG HYDROXIDE-SIMETH 200-200-20 MG/5ML PO SUSP: 30.0000 mL | ORAL | Status: DC | PRN

## 2022-08-30 MED ORDER — DIPHENHYDRAMINE HCL 50 MG/ML IJ SOLN: 50.0000 mg | Freq: Three times a day (TID) | INTRAMUSCULAR | Status: DC | PRN

## 2022-08-30 MED ORDER — LORAZEPAM 1 MG PO TABS: 2.0000 mg | ORAL_TABLET | Freq: Three times a day (TID) | ORAL | Status: DC | PRN

## 2022-08-30 MED ORDER — LAMOTRIGINE 25 MG PO TABS
25.0000 mg | ORAL_TABLET | Freq: Two times a day (BID) | ORAL | Status: DC
  Administered 2022-08-30 – 2022-08-31 (×2): 25 mg via ORAL
  Filled 2022-08-30 (×7): qty 1

## 2022-08-30 MED ORDER — HALOPERIDOL LACTATE 5 MG/ML IJ SOLN: 5.0000 mg | Freq: Three times a day (TID) | INTRAMUSCULAR | Status: DC | PRN

## 2022-08-30 MED ORDER — MAGNESIUM HYDROXIDE 400 MG/5ML PO SUSP: 30.0000 mL | Freq: Every day | ORAL | Status: DC | PRN

## 2022-08-30 MED ORDER — NICOTINE 21 MG/24HR TD PT24
21.0000 mg | MEDICATED_PATCH | Freq: Every day | TRANSDERMAL | Status: DC
  Filled 2022-08-30: qty 1

## 2022-08-30 NOTE — ED Provider Notes (Signed)
[facility] Continuous Assessment Admission H&P  Date: 08/30/22 Patient Name: Jennifer York MRN: [id number] Chief Complaint: "I tied a noose with a scarf and the other end to the door knob"  Diagnoses:  Final diagnoses:  MDD (major depressive disorder), recurrent severe, without psychosis (HCC)    HPI: Jennifer York is a 32 y.o. 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  Patient assessed face-to-face by this provider and chart reviewed on 08/30/22. Sydell Axon, [facility] present during assessment. On evaluation, Jennifer York is seated in assessment area in no acute distress. Patient is alert and oriented x4, cooperative and pleasant. Speech is clear and coherent, normal rate and volume. Eye contact is good. Mood is anxious and depressed with flat and congruent affect. Thought process is coherent with thought content that consists of active suicidal ideations. Patient reports since yesterday, she has had active suicidal ideations with a plan to hang herself with a scarf. Patient reports yesterday she attempted suicide, "I tied a noose with a scarf and the other end to the door knob, I put my head in it and pulled it tight, I started to pass out then I kicked out of it." Patient states she was at home by herself during the suicide attempt. Patient reports today, "I put my head back in the noose and it's like I can't do it." Patient states she had an appointment with her therapist Beaulah Dinning at [facility] today who recommended she contact mobile crisis for help after she shared her suicide attempts from yesterday and today. Patient is unable to contract for safety at this time. Patient denies homicidal ideations.  Patient reports no past suicide attempts prior to yesterday and today. Patient reports a history of self-harm by cutting, stating she last cut in 2022. Patient reports 2 past psychiatric hospitalizations at [facility] in February 2017 and June 2024. Patient denies auditory and visual hallucinations. Patient denies symptoms of paranoia. Patient is able to converse coherently with goal-directed thoughts and no distractibility or preoccupation. Objectively, there is no evidence of psychosis/mania, delusional thinking, or indication that patient is responding to internal or external stimuli.  Patient reports increased sleep (10 hours/night) stating "what's the point of getting out of bed?" Patient reports decreased appetite. Patient states she lives with her partner and denies access to weapons/firearms. Patient reports she just started a job at [employer] yesterday and believes she may have lost her job because she didn't show up today. Patient reports her stressors include, "recovering from a psychotic break from [place] this year, since then I have not been myself, I have no pleasure in doing anything and stress reintegrating into society." Patient reports stress from also "trying to cope" with her diagnosis of schizoaffective disorder, bipolar type. Patient reports increased anxiety "it feels like I'm not able to speak well, my mind blanks and I don't know what to say to people." Patient reports her depression "has gotten worse over time, taking a shower feels like it takes extensive effort." Patient reports alcohol use of 1 drink of seltzer or wine every night, last use last night. Patient denies use of illicit substances. Patient reports she is currently prescribed Lamictal 25mg  BID and Risperdal 1mg  daily at bedtime by her provider at [facility], which  she reports being compliant with taking. Patient reports she just started therapy at [facility] and today was her second visit but feel it is going well.  Patient identifies her mother and partner as her support system.   Patient offered support and encouragement. Discussed with patient recommendations for inpatient psychiatric hospitalization. Discussed with patient admission to the continuous observation unit while awaiting inpatient psychiatric placement. Discussed with patient continuing her prescribed Lamictal and Risperdal while on the continuous observation unit. Patient is in agreement with plan of care.  Total Time spent with patient: 30 minutes  Musculoskeletal  Strength & Muscle Tone: within normal limits Gait & Station: normal Patient leans: N/A  Psychiatric Specialty Exam  Presentation General Appearance:  Appropriate for Environment  Eye Contact: Good  Speech: Clear and Coherent; Normal Rate  Speech Volume: Normal  Handedness: Right   Mood and Affect  Mood: Depressed; Anxious  Affect: Congruent; Flat   Thought Process  Thought Processes: Coherent; Goal Directed  Descriptions of Associations:Intact  Orientation:Full (Time, Place and Person)  Thought Content:Logical  Diagnosis of Schizophrenia or Schizoaffective disorder in past: Yes   Hallucinations:Hallucinations: None  Ideas of Reference:None  Suicidal Thoughts:Suicidal Thoughts: Yes, Active SI Active Intent and/or Plan: With Intent; With Plan; With Means to Carry Out; With Access to Means  Homicidal Thoughts:Homicidal Thoughts: No   Sensorium  Memory: Immediate Good; Recent Good; Remote Good  Judgment: Fair  Insight: Fair   Art therapist  Concentration: Good  Attention Span: Good  Recall: Good  Fund of Knowledge: Good  Language: Good   Psychomotor Activity  Psychomotor Activity: Psychomotor Activity: Normal   Assets  Assets: Communication Skills; Desire for Improvement; Financial Resources/Insurance; Housing; Leisure Time; Physical Health; Resilience; Social Support   Sleep  Sleep: Sleep:  Good Number of Hours of Sleep: 10   Nutritional Assessment (For OBS and FBC admissions only) Has the patient had a weight loss or gain of 10 pounds or more in the last 3 months?: No Has the patient had a decrease in food intake/or appetite?: Yes Does the patient have dental problems?: No Does the patient have eating habits or behaviors that may be indicators of an eating disorder including binging or inducing vomiting?: No Has the patient recently lost weight without trying?: 0 Has the patient been eating poorly because of a decreased appetite?: 1 Malnutrition Screening Tool Score: 1    Physical Exam Vitals and nursing note reviewed.  Constitutional:      General: She is not in acute distress.    Appearance: Normal appearance. She is not ill-appearing.  HENT:     Head: Normocephalic and atraumatic.     Nose: Nose normal.  Eyes:     General:        Right eye: No discharge.        Left eye: No discharge.     Conjunctiva/sclera: Conjunctivae normal.  Cardiovascular:     Rate and Rhythm: Normal rate.  Pulmonary:     Effort: Pulmonary effort is normal. No respiratory distress.  Musculoskeletal:        General: Normal range of motion.     Cervical back: Normal range of motion.  Skin:    General: Skin is warm and dry.  Neurological:     General: No focal deficit present.     Mental Status: She is alert and oriented to person, place, and time. Mental status is at baseline.  Psychiatric:        Attention and Perception: Attention and  perception normal.        Mood and Affect: Mood is anxious and depressed. Affect is flat.        Speech: Speech normal.        Behavior: Behavior normal. Behavior is cooperative.        Thought Content: Thought content is not paranoid or delusional. Thought content includes suicidal ideation. Thought content does not include homicidal ideation. Thought content includes suicidal plan. Thought content does not include homicidal plan.        Cognition  and Memory: Cognition and memory normal.     Comments: Judgment: Fair    Review of Systems  Constitutional: Negative.   HENT: Negative.    Eyes: Negative.   Respiratory: Negative.    Cardiovascular: Negative.   Gastrointestinal: Negative.   Genitourinary: Negative.   Musculoskeletal: Negative.   Skin: Negative.   Neurological: Negative.   Endo/Heme/Allergies: Negative.   Psychiatric/Behavioral:  Positive for depression and suicidal ideas. Negative for hallucinations, memory loss and substance abuse. The patient is nervous/anxious. The patient does not have insomnia.     Blood pressure 104/65, pulse 60, temperature 98.2 F (36.8 C), temperature source Oral, resp. rate 18, SpO2 98%. There is no height or weight on file to calculate BMI.  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 Yes  Has the patient been a risk to self in the past 6 months? Yes .    Has the patient been a risk to self within the distant past? Yes   Is the patient a risk to others? No   Has the patient been a risk to others in the past 6 months? No   Has the patient been a risk to others within the distant past? No   Past Medical History:  Past Medical History:  Diagnosis Date   Family history of adverse reaction to anesthesia    father has woken up during surgery   Medical history non-contributory    Family History:  Family History  Problem Relation Age of Onset   Depression Mother    Depression Father    Depression Brother    Social History:  Social History   Tobacco Use   Smoking status: Every Day    Current packs/day: 1.00    Types: Cigarettes   Smokeless tobacco: Current    Types: Chew  Vaping Use   Vaping status: Every Day   Substances: Nicotine  Substance Use Topics   Alcohol use: Yes    Comment: drinks 'occasionally'   Drug use: Yes    Types:  Marijuana    Comment: smokes weed 'occasionally'   Last Labs:  Admission on 08/30/2022  Component Date Value Ref Range Status   Preg Test, Ur 08/30/2022 Negative  Negative Final   POC Amphetamine UR 08/30/2022 None Detected  NONE DETECTED (Cut Off Level 1000 ng/mL) Final   POC Secobarbital (BAR) 08/30/2022 None Detected  NONE DETECTED (Cut Off Level 300 ng/mL) Final   POC Buprenorphine (BUP) 08/30/2022 None Detected  NONE DETECTED (Cut Off Level 10 ng/mL) Final   POC Oxazepam (BZO) 08/30/2022 None Detected  NONE DETECTED (Cut Off Level 300 ng/mL) Final   POC Cocaine UR 08/30/2022 None Detected  NONE DETECTED (Cut Off Level 300 ng/mL) Final   POC Methamphetamine UR 08/30/2022 None Detected  NONE DETECTED (Cut Off Level 1000 ng/mL) Final   POC Morphine 08/30/2022  None Detected  NONE DETECTED (Cut Off Level 300 ng/mL) Final   POC Methadone UR 08/30/2022 None Detected  NONE DETECTED (Cut Off Level 300 ng/mL) Final   POC Oxycodone UR 08/30/2022 None Detected  NONE DETECTED (Cut Off Level 100 ng/mL) Final   POC Marijuana UR 08/30/2022 None Detected  NONE DETECTED (Cut Off Level 50 ng/mL) Final  Admission on 07/22/2022, Discharged on 07/22/2022  Component Date Value Ref Range Status   WBC 07/22/2022 6.9  4.0 - 10.5 K/uL Final   RBC 07/22/2022 3.81 (L)  3.87 - 5.11 MIL/uL Final   Hemoglobin 07/22/2022 11.7 (L)  12.0 - 15.0 g/dL Final   HCT 40/98/1191 36.0  36.0 - 46.0 % Final   MCV 07/22/2022 94.5  80.0 - 100.0 fL Final   MCH 07/22/2022 30.7  26.0 - 34.0 pg Final   MCHC 07/22/2022 32.5  30.0 - 36.0 g/dL Final   RDW 47/82/9562 12.2  11.5 - 15.5 % Final   Platelets 07/22/2022 202  150 - 400 K/uL Final   nRBC 07/22/2022 0.0  0.0 - 0.2 % Final   Neutrophils Relative % 07/22/2022 61  % Final   Neutro Abs 07/22/2022 4.1  1.7 - 7.7 K/uL Final   Lymphocytes Relative 07/22/2022 31  % Final   Lymphs Abs 07/22/2022 2.2  0.7 - 4.0 K/uL Final   Monocytes Relative 07/22/2022 6  % Final   Monocytes  Absolute 07/22/2022 0.4  0.1 - 1.0 K/uL Final   Eosinophils Relative 07/22/2022 2  % Final   Eosinophils Absolute 07/22/2022 0.1  0.0 - 0.5 K/uL Final   Basophils Relative 07/22/2022 0  % Final   Basophils Absolute 07/22/2022 0.0  0.0 - 0.1 K/uL Final   Immature Granulocytes 07/22/2022 0  % Final   Abs Immature Granulocytes 07/22/2022 0.02  0.00 - 0.07 K/uL Final   Performed at [facility], [address] [place], [place] [address]   Sodium 07/22/2022 141  135 - 145 mmol/L Final   Potassium 07/22/2022 4.0  3.5 - 5.1 mmol/L Final   Chloride 07/22/2022 109  98 - 111 mmol/L Final   CO2 07/22/2022 27  22 - 32 mmol/L Final   Glucose, Bld 07/22/2022 82  70 - 99 mg/dL Final   Glucose reference range applies only to samples taken after fasting for at least 8 hours.   BUN 07/22/2022 10  6 - 20 mg/dL Final   Creatinine, Ser 07/22/2022 0.78  0.44 - 1.00 mg/dL Final   Calcium 57/84/6962 8.3 (L)  8.9 - 10.3 mg/dL Final   GFR, Estimated 07/22/2022 >60  >60 mL/min Final   Comment: (NOTE) Calculated using the CKD-EPI Creatinine Equation (2021)    Anion gap 07/22/2022 5  5 - 15 Final   Performed at [facility], [address], [place], [place] [address]  Admission on 07/01/2022, Discharged on 07/11/2022  Component Date Value Ref Range Status   Cholesterol 07/05/2022 131  0 - 200 mg/dL Final   Triglycerides 13/24/4010 40  <150 mg/dL Final   HDL 27/25/3664 56  >40 mg/dL Final   Total CHOL/HDL Ratio 07/05/2022 2.3  RATIO Final   VLDL 07/05/2022 8  0 - 40 mg/dL Final   LDL Cholesterol 07/05/2022 67  0 - 99 mg/dL Final   Comment:        Total Cholesterol/HDL:CHD Risk Coronary Heart Disease Risk Table  Men   Women  1/2 Average Risk   3.4   3.3  Average Risk       5.0   4.4  2 X Average Risk   9.6   7.1  3 X Average Risk  23.4   11.0        Use the calculated Patient Ratio above and the CHD Risk Table to determine the patient's CHD Risk.        ATP III  CLASSIFICATION (LDL):  <100     mg/dL   Optimal  161-096  mg/dL   Near or Above                    Optimal  130-159  mg/dL   Borderline  045-409  mg/dL   High  >811     mg/dL   Very High Performed at [facility], [address] [place], [place] [address]    Vit D, 25-Hydroxy 07/05/2022 28.30 (L)  30 - 100 ng/mL Final   Comment: (NOTE) Vitamin D deficiency has been defined by the Institute of Medicine  and an Endocrine Society practice guideline as a level of serum 25-OH  vitamin D less than 20 ng/mL (1,2). The Endocrine Society went on to  further define vitamin D insufficiency as a level between 21 and 29  ng/mL (2).  1. IOM (Institute of Medicine). 2010. Dietary reference intakes for  calcium and D. [place]: [employer]. 2. Holick MF, Binkley Haywood, Bischoff-Ferrari HA, et al. Evaluation,  treatment, and prevention of vitamin D deficiency: an Endocrine  Society clinical practice guideline, JCEM. 2011 Jul; 96(7): 1911-30.  Performed at [facility], [address], [place], [place] [address]    Hgb A1c MFr Bld 07/05/2022 4.8  4.8 - 5.6 % Final   Comment: (NOTE) Pre diabetes:          5.7%-6.4%  Diabetes:              >6.4%  Glycemic control for   <7.0% adults with diabetes    Mean Plasma Glucose 07/05/2022 91.06  mg/dL Final   Performed at [facility], [address] [place], [place] [address]  Admission on 05/18/2022, Discharged on 05/19/2022  Component Date Value Ref Range Status   WBC 05/18/2022 7.9  4.0 - 10.5 K/uL Final   RBC 05/18/2022 4.05  3.87 - 5.11 MIL/uL Final   Hemoglobin 05/18/2022 12.6  12.0 - 15.0 g/dL Final   HCT 57/84/6962 37.2  36.0 - 46.0 % Final   MCV 05/18/2022 91.9  80.0 - 100.0 fL Final   MCH 05/18/2022 31.1  26.0 - 34.0 pg Final   MCHC 05/18/2022 33.9  30.0 - 36.0 g/dL Final   RDW 95/28/4132 12.9  11.5 - 15.5 % Final   Platelets 05/18/2022 228  150 - 400 K/uL Final   nRBC 05/18/2022 0.0   0.0 - 0.2 % Final   Neutrophils Relative % 05/18/2022 59  % Final   Neutro Abs 05/18/2022 4.6  1.7 - 7.7 K/uL Final   Lymphocytes Relative 05/18/2022 33  % Final   Lymphs Abs 05/18/2022 2.6  0.7 - 4.0 K/uL Final   Monocytes Relative 05/18/2022 6  % Final   Monocytes Absolute 05/18/2022 0.4  0.1 - 1.0 K/uL Final   Eosinophils Relative 05/18/2022 2  % Final   Eosinophils Absolute 05/18/2022 0.2  0.0 - 0.5 K/uL Final   Basophils Relative 05/18/2022 0  %  Final   Basophils Absolute 05/18/2022 0.0  0.0 - 0.1 K/uL Final   Immature Granulocytes 05/18/2022 0  % Final   Abs Immature Granulocytes 05/18/2022 0.02  0.00 - 0.07 K/uL Final   Performed at [facility], [address], [place], [place] [address]   Sodium 05/18/2022 140  135 - 145 mmol/L Final   Potassium 05/18/2022 3.9  3.5 - 5.1 mmol/L Final   Chloride 05/18/2022 105  98 - 111 mmol/L Final   CO2 05/18/2022 26  22 - 32 mmol/L Final   Glucose, Bld 05/18/2022 93  70 - 99 mg/dL Final   Glucose reference range applies only to samples taken after fasting for at least 8 hours.   BUN 05/18/2022 8  6 - 20 mg/dL Final   Creatinine, Ser 05/18/2022 0.70  0.44 - 1.00 mg/dL Final   Calcium 70/35/0093 9.2  8.9 - 10.3 mg/dL Final   Total Protein 81/82/9937 6.5  6.5 - 8.1 g/dL Final   Albumin 16/96/7893 4.0  3.5 - 5.0 g/dL Final   AST 81/01/7508 19  15 - 41 U/L Final   ALT 05/18/2022 16  0 - 44 U/L Final   Alkaline Phosphatase 05/18/2022 59  38 - 126 U/L Final   Total Bilirubin 05/18/2022 0.9  0.3 - 1.2 mg/dL Final   GFR, Estimated 05/18/2022 >60  >60 mL/min Final   Comment: (NOTE) Calculated using the CKD-EPI Creatinine Equation (2021)    Anion gap 05/18/2022 9  5 - 15 Final   Performed at [facility], [address], [place], [place] [address]   Magnesium 05/18/2022 2.1  1.7 - 2.4 mg/dL Final   Performed at [facility], [address], [place], [place] [address]   Alcohol, Ethyl (B) 05/18/2022 <10  <10 mg/dL Final    Comment: (NOTE) Lowest detectable limit for serum alcohol is 10 mg/dL.  For medical purposes only. Performed at [facility], [address], [place], [place] [address]    Prolactin 05/18/2022 17.3  4.8 - 33.4 ng/mL Final   Comment: (NOTE) Performed At: [facility] [address] [place], [place] [id number] Jolene Schimke MD QQ:[id number]    POC Amphetamine UR 05/18/2022 None Detected  NONE DETECTED (Cut Off Level 1000 ng/mL) Final   POC Secobarbital (BAR) 05/18/2022 None Detected  NONE DETECTED (Cut Off Level 300 ng/mL) Final   POC Buprenorphine (BUP) 05/18/2022 None Detected  NONE DETECTED (Cut Off Level 10 ng/mL) Final   POC Oxazepam (BZO) 05/18/2022 None Detected  NONE DETECTED (Cut Off Level 300 ng/mL) Final   POC Cocaine UR 05/18/2022 None Detected  NONE DETECTED (Cut Off Level 300 ng/mL) Final   POC Methamphetamine UR 05/18/2022 None Detected  NONE DETECTED (Cut Off Level 1000 ng/mL) Final   POC Morphine 05/18/2022 None Detected  NONE DETECTED (Cut Off Level 300 ng/mL) Final   POC Methadone UR 05/18/2022 None Detected  NONE DETECTED (Cut Off Level 300 ng/mL) Final   POC Oxycodone UR 05/18/2022 None Detected  NONE DETECTED (Cut Off Level 100 ng/mL) Final   POC Marijuana UR 05/18/2022 Positive (A)  NONE DETECTED (Cut Off Level 50 ng/mL) Final   Preg Test, Ur 05/18/2022 NEGATIVE  NEGATIVE Final   Comment:        THE SENSITIVITY OF THIS METHODOLOGY IS >24 mIU/mL    TSH 05/18/2022 0.915  0.350 - 4.500 uIU/mL Final   Comment: Performed by a 3rd Generation assay with a functional sensitivity of <=0.01 uIU/mL.  Performed at [facility], [address], [place], [place] [address]     Allergies: Gluten meal  Medications:  Facility Ordered Medications  Medication   acetaminophen (TYLENOL) tablet 650 mg   alum & mag hydroxide-simeth (MAALOX/MYLANTA) 200-200-20 MG/5ML suspension 30 mL   magnesium hydroxide (MILK OF MAGNESIA) suspension 30 mL   hydrOXYzine  (ATARAX) tablet 25 mg   traZODone (DESYREL) tablet 50 mg   lamoTRIgine (LAMICTAL) tablet 25 mg   risperiDONE (RISPERDAL) tablet 1 mg   PTA Medications  Medication Sig   hydrOXYzine (ATARAX) 25 MG tablet Take 1 tablet (25 mg total) by mouth 3 (three) times daily as needed for anxiety.   lamoTRIgine (LAMICTAL) 25 MG tablet Take 1 tablet (25 mg total) by mouth every 12 (twelve) hours.   oxyCODONE-acetaminophen (PERCOCET/ROXICET) 5-325 MG tablet Take 1 tablet by mouth every 6 (six) hours as needed for severe pain.   chlorhexidine (PERIDEX) 0.12 % solution Use as directed 15 mLs in the mouth or throat 2 (two) times daily. Rinse for 30 seconds after brushing, then spit.   ibuprofen (ADVIL) 800 MG tablet Take 800 mg by mouth every 6 (six) hours as needed (For pain).   risperiDONE (RISPERDAL) 2 MG tablet Take 2 mg by mouth at bedtime.   penicillin v potassium (VEETID) 500 MG tablet Take 500 mg by mouth 4 (four) times daily. Take for 14 days starting on 08/21/22.      Medical Decision Making  Jennifer York was admitted to [facility] continuous assessment unit for MDD (major depressive disorder), recurrent severe, without psychosis (HCC), crisis management, and stabilization. Patient remains voluntary. Routine labs ordered, which include Lab Orders         CBC with Differential/Platelet         Comprehensive metabolic panel         Hemoglobin A1c         Magnesium         Ethanol         Lipid panel         Prolactin         TSH         POC urine preg, ED         POCT Urine Drug Screen - (I-Screen)    -EKG Medication Management: Medications started, continue home medications as appropriate Meds ordered this encounter  Medications   acetaminophen (TYLENOL) tablet 650 mg   alum & mag hydroxide-simeth (MAALOX/MYLANTA) 200-200-20 MG/5ML suspension 30 mL   magnesium hydroxide (MILK OF MAGNESIA) suspension 30 mL   hydrOXYzine (ATARAX) tablet 25 mg   traZODone  (DESYREL) tablet 50 mg   lamoTRIgine (LAMICTAL) tablet 25 mg   risperiDONE (RISPERDAL) tablet 1 mg    Will maintain observation checks every 15 minutes for safety. Psychosocial education regarding relapse prevention and self-care; social and communication  Social work will consult with family for collateral information and discuss discharge and follow up plan.   Recommendations  Based on my evaluation the patient does not appear to have an emergency medical condition.  -Continue Lamictal 25mg  BID -Continue Risperdal 1mg  daily at bedtime -Recommend inpatient psychiatric hospitalization. Per Rona Ravens, RN/[facility], patient has been accepted to [facility]-1 pending labs, voluntary consent, EKG. Accepting provider is Dr. Sherron Flemings.   Sunday Corn, NP 08/30/22  2:25 PM
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
## 2022-08-30 NOTE — ED Notes (Signed)
Report called in [place] RN @ [facility]. Safe transport called. Pt currently socializing on the unit with other pts. A/O, calm, & ambulatory. Denies SI, HI, & AVH at the current. Will continue to monitor and report any COC.

## 2022-08-30 NOTE — BHH Group Notes (Signed)
Adult Psychoeducational Group Note  Date:  08/30/2022 Time:  9:30 PM  Group Topic/Focus:  Wrap-Up Group:   The focus of this group is to help patients review their daily goal of treatment and discuss progress on daily workbooks.  Participation Level:  Did Not Attend  Christ Kick 08/30/2022, 9:30 PM

## 2022-08-30 NOTE — ED Notes (Signed)
Patient is transferring to [facility] at this time via safe transport. Voluntary consent uploaded. EMTALA, and other transfer paperwork sent with patient and provided to transport. Patient A&OX4. Passive SI. Denies HI, and A/V/H. Calm and cooperative. All valuables/belongings sent with patient. Patient in no current distress.

## 2022-08-30 NOTE — Progress Notes (Addendum)
   08/30/22 1224  BHUC Triage Screening (Walk-ins at [facility] only)  How Did You Hear About [place]? Legal System  What Is the Reason for Your Visit/Call Today? Jennifer York is a 32 y/o female presenting to [facility] escorted by [facility] due to ongoing SI with a plan for the past 2 days. Pt reports a plan to hang herself with a scarf. Pt reports she is diagnosed with Schizoaffective disorder and is prescribed Risperidone 1 mg, lamitcal 25mg . Pt reports feeling hopeless. She states she has a therapist by the name of Monica Becton that she contacted today for help, her therapist instructed her to hang up and call the mobile crisis. Per [employer] mobile crisis contacted them to bring the pt to this facility for further assistance. Pt reports consuming alcohol yesterday 1 beer.Pt reports she lives with her partner [name]. Pt denies HI and AVH.  How Long Has This Been Causing You Problems? <Week  Have You Recently Had Any Thoughts About Hurting Yourself? Yes  How long ago did you have thoughts about hurting yourself? today  Are You Planning to Commit Suicide/Harm Yourself At This time? Yes (use scarf to hang herself)  Have you Recently Had Thoughts About Hurting Someone [name]? No  Are You Planning To Harm Someone At This Time? No  Are you currently experiencing any auditory, visual or other hallucinations? No  Have You Used Any Alcohol or Drugs in the Past 24 Hours? Yes  How long ago did you use Drugs or Alcohol? yesterday  What Did You Use and How Much? 1 beer  Do you have any current medical co-morbidities that require immediate attention? No  Clinician description of patient physical appearance/behavior: wearing bath robe, shorts, sandals  What Do You Feel Would Help You the Most Today? Treatment for Depression or other mood problem  If access to [facility] was not available, would you have sought care in the Emergency Department? No  Determination of Need Urgent (48 hours)  Options For Referral Inpatient  Hospitalization

## 2022-08-30 NOTE — Progress Notes (Signed)
   08/30/22 2229  Psych Admission Type (Psych Patients Only)  Admission Status Voluntary  Psychosocial Assessment  Patient Complaints Anxiety;Depression;Anhedonia  Eye Contact Fair  Facial Expression Animated  Affect Appropriate to circumstance;Anxious;Depressed  Speech Logical/coherent  Interaction Assertive  Motor Activity Other (Comment) (wnl)  Appearance/Hygiene Unremarkable  Behavior Characteristics Cooperative;Appropriate to situation;Anxious  Mood Pleasant  Thought Process  Coherency WDL  Content WDL  Delusions None reported or observed  Perception WDL  Hallucination None reported or observed  Judgment WDL  Confusion None  Danger to Self  Current suicidal ideation? Denies  Self-Injurious Behavior No self-injurious ideation or behavior indicators observed or expressed   Agreement Not to Harm Self Yes  Description of Agreement verbal  Danger to Others  Danger to Others None reported or observed   Progress note   D: Pt seen at nurse's station. Pt denies SI, HI, AVH. Pt rates pain  0/10. Pt rates anxiety  3/10 and depression  9/10. Pt states that she was here about 7 weeks ago. "I was here for psychosis then but there is no psychosis now, only depression. I haven't felt back at my baseline since then. I was diagnosed with schizoaffective disorder then too. I knew about the bipolar. I still don't feel any pleasure in doing anything and no motivation. The meds, as they are, don't seem to be working. Things were starting to escalate so I figured I better come back and get some help." Pt is open to med changes in order to feel better. Endorses med compliance. No other concerns noted at this time.  A: Pt provided support and encouragement. Pt given scheduled medication as prescribed. PRNs as appropriate. Q15 min checks for safety.   R: Pt safe on the unit. Will continue to monitor.

## 2022-08-30 NOTE — Progress Notes (Signed)
Patient admitted to [facility] for SI with a plan to hang herself with a scarf. Patient still endorsing passive SI on admission. Denies HI, AVH, and contracts for safety. Jennifer York is allergic to gluten. Efforts made to locate and update the diet/allergy notebook. Notebook finally located and passed on to night shift to update. Patient smokes and drinks a few times a week having 2 drinks per occasion. Nicotine patch ordered. She is a low fall risk. She reports trouble concentrating, hopelessness, anxiety, depression, decreased appetite, and sleeping too much. She would like to speak with chaplin and this RN placed the order. She has an irrigation syringe in the 300 hall medication room that she needs to use after meals because she recently had some teeth removed. She reports her mother and boyfriend are her support system. She lives with her boyfriend and he takes her places she needs to go. She denies any difficulty obtaining her medications. She has no job and no car. Patient oriented to the unit. Salad, sandwich tray, and drink provided. 15 minute safety checks started. Safety maintained.

## 2022-08-30 NOTE — Tx Team (Signed)
Initial Treatment Plan 08/30/2022 7:16 PM Jennifer York [id number]    PATIENT STRESSORS: Financial difficulties     PATIENT STRENGTHS: Active sense of humor  Average or above average intelligence  Communication skills  General fund of knowledge  Motivation for treatment/growth  Physical Health  Supportive family/friends    PATIENT IDENTIFIED PROBLEMS: "I want the will to live"  "Perseverance"                   DISCHARGE CRITERIA:  Improved stabilization in mood, thinking, and/or behavior Need for constant or close observation no longer present  PRELIMINARY DISCHARGE PLAN: Return to previous living arrangement  PATIENT/FAMILY INVOLVEMENT: This treatment plan has been presented to and reviewed with the patient, Jennifer York.  The patient has been given the opportunity to ask questions and make suggestions.  Edwyna Perfect, RN 08/30/2022, 7:16 PM

## 2022-08-30 NOTE — BHH Group Notes (Signed)
BHH Group Notes:  (Nursing/MHT/Case Management/Adjunct)  Date:  08/30/2022  Time:  9:19 PM  Type of Therapy:   Wrap-up group  Participation Level:  Active  Participation Quality:  Appropriate  Affect:  Appropriate  Cognitive:  Appropriate  Insight:  Appropriate  Engagement in Group:  Engaged  Modes of Intervention:  Education  Summary of Progress/Problems:  Pt goal to not feel numb. Pt rates day 3/10.   Noah Delaine 08/30/2022, 9:19 PM

## 2022-08-30 NOTE — ED Provider Notes (Signed)
FBC/OBS ASAP Discharge Summary  Date and Time: 08/30/2022 4:09 PM  Name: Jennifer York  MRN:  [id number]   Discharge Diagnoses:  Final diagnoses:  MDD (major depressive disorder), recurrent severe, without psychosis (HCC)   Subjective: "I tied a noose with a scarf and the other end to the door knob"  Stay Summary: Per [facility] Admission H&P 08/30/22 1259: "HPI: Jennifer York is a 32 y.o. 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   Patient assessed face-to-face by this provider and chart reviewed on 08/30/22. Sydell Axon, [facility] present during assessment. On evaluation, Jennifer York is seated in assessment area in no acute distress. Patient is alert and oriented x4, cooperative and pleasant. Speech is clear and coherent, normal rate and volume. Eye contact is good. Mood is anxious and depressed with flat and congruent affect. Thought process is coherent with thought content that consists of active suicidal ideations. Patient reports since yesterday, she has had active suicidal ideations with a plan to hang herself with a scarf. Patient reports yesterday she attempted suicide, "I tied a noose with a scarf and the other end to the door knob, I put my head in it and pulled it tight, I started to pass out then I kicked out of it." Patient states she was at home by herself during the suicide attempt. Patient reports today, "I put my head back in the noose and it's like I can't do it." Patient states she had an appointment with her therapist Beaulah Dinning at [facility] today who recommended she contact mobile crisis for help after she shared her suicide attempts from yesterday and today. Patient is unable to contract for safety at  this time. Patient denies homicidal ideations. Patient reports no past suicide attempts prior to yesterday and today. Patient reports a history of self-harm by cutting, stating she last cut in 2022. Patient reports 2 past psychiatric hospitalizations at [facility] in February 2017 and June 2024. Patient denies auditory and visual hallucinations. Patient denies symptoms of paranoia. Patient is able to converse coherently with goal-directed thoughts and no distractibility or preoccupation. Objectively, there is no evidence of psychosis/mania, delusional thinking, or indication that patient is responding to internal or external stimuli.   Patient reports increased sleep (10 hours/night) stating "what's the point of getting out of bed?" Patient reports decreased appetite. Patient states she lives with her partner and denies access to weapons/firearms. Patient reports she just started a job at [employer] yesterday and believes she may have lost her job because she didn't show up today. Patient reports her stressors include, "recovering from a psychotic break from [place] this year, since then I have not been myself, I have no pleasure in doing anything and stress reintegrating into society." Patient reports stress from also "trying to cope" with her diagnosis of schizoaffective disorder, bipolar type. Patient reports increased anxiety "it feels like I'm not able to speak well, my mind blanks and I don't know what to say to people." Patient reports her depression "has gotten worse over time, taking a shower feels like it takes extensive effort." Patient reports alcohol use of 1 drink of seltzer or wine every night, last use last night. Patient denies use of illicit substances. Patient reports she is currently prescribed Lamictal 25mg   BID and Risperdal 1mg  daily at bedtime by her provider at [facility], which she reports being compliant with taking. Patient reports she just started therapy at [facility] and today  was her second visit but feel it is going well. Patient identifies her mother and partner as her support system.    Patient offered support and encouragement. Discussed with patient recommendations for inpatient psychiatric hospitalization. Discussed with patient admission to the continuous observation unit while awaiting inpatient psychiatric placement. Discussed with patient continuing her prescribed Lamictal and Risperdal while on the continuous observation unit. 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, bipolar 1 disorder  Past Medical History:  Past Medical History:  Diagnosis Date   Family history of adverse reaction to anesthesia    father has woken up during surgery   Medical history non-contributory    Family History:  Family History  Problem Relation Age of Onset   Depression Mother    Depression Father    Depression Brother    Family Psychiatric History:  Family History  Problem Relation Age of Onset   Depression Mother    Depression Father    Depression Brother    Social History:  Social History   Tobacco Use   Smoking status: Every Day    Current packs/day: 1.00    Types: Cigarettes   Smokeless tobacco: Current    Types: Chew  Vaping Use   Vaping status: Every Day   Substances: Nicotine  Substance Use Topics   Alcohol use: Yes    Comment: drinks 'occasionally'   Drug use: Yes    Types: Marijuana    Comment: smokes weed 'occasionally'   Tobacco Cessation:  Prescription not provided because: Patient being transferred to another facility for inpatient psychiatric treatment  Current Medications:  Current Facility-Administered Medications  Medication Dose Route Frequency Provider Last Rate Last Admin   acetaminophen (TYLENOL) tablet 650 mg  650 mg Oral Q6H PRN Sunday Corn, NP        alum & mag hydroxide-simeth (MAALOX/MYLANTA) 200-200-20 MG/5ML suspension 30 mL  30 mL Oral Q4H PRN Sunday Corn, NP       hydrOXYzine (ATARAX) tablet 25 mg  25 mg Oral TID PRN Sunday Corn, NP       lamoTRIgine (LAMICTAL) tablet 25 mg  25 mg Oral BID Sunday Corn, NP       magnesium hydroxide (MILK OF MAGNESIA) suspension 30 mL  30 mL Oral Daily PRN Sunday Corn, NP       risperiDONE (RISPERDAL) tablet 1 mg  1 mg Oral QHS Tomy Khim, Verdon Cummins, NP       traZODone (DESYREL) tablet 50 mg  50 mg Oral QHS PRN Sunday Corn, NP       Current Outpatient Medications  Medication Sig Dispense Refill   chlorhexidine (PERIDEX) 0.12 % solution Use as directed 15 mLs in the mouth or throat 2 (two) times daily. Rinse for 30 seconds after brushing, then spit.     hydrOXYzine (ATARAX) 25 MG tablet Take 1 tablet (25 mg total) by mouth 3 (three) times daily as needed for anxiety. 30 tablet 0   ibuprofen (ADVIL) 800 MG tablet Take 800 mg by mouth every 6 (six) hours as needed (For pain).     lamoTRIgine (LAMICTAL) 25 MG tablet Take 1 tablet (25 mg total)  by mouth every 12 (twelve) hours. 60 tablet 0   oxyCODONE-acetaminophen (PERCOCET/ROXICET) 5-325 MG tablet Take 1 tablet by mouth every 6 (six) hours as needed for severe pain. 6 tablet 0   penicillin v potassium (VEETID) 500 MG tablet Take 500 mg by mouth 4 (four) times daily. Take for 14 days starting on 08/21/22.     risperiDONE (RISPERDAL) 2 MG tablet Take 2 mg by mouth at bedtime.     levothyroxine (SYNTHROID) 100 MCG tablet Take 100 mcg by mouth daily before breakfast. (Patient not taking: Reported on 08/30/2022)      PTA Medications:  Facility Ordered Medications  Medication   acetaminophen (TYLENOL) tablet 650 mg   alum & mag hydroxide-simeth (MAALOX/MYLANTA) 200-200-20 MG/5ML suspension 30 mL   magnesium hydroxide (MILK OF MAGNESIA) suspension 30 mL   hydrOXYzine (ATARAX) tablet 25 mg   traZODone (DESYREL) tablet 50 mg    lamoTRIgine (LAMICTAL) tablet 25 mg   risperiDONE (RISPERDAL) tablet 1 mg   PTA Medications  Medication Sig   hydrOXYzine (ATARAX) 25 MG tablet Take 1 tablet (25 mg total) by mouth 3 (three) times daily as needed for anxiety.   lamoTRIgine (LAMICTAL) 25 MG tablet Take 1 tablet (25 mg total) by mouth every 12 (twelve) hours.   oxyCODONE-acetaminophen (PERCOCET/ROXICET) 5-325 MG tablet Take 1 tablet by mouth every 6 (six) hours as needed for severe pain.   chlorhexidine (PERIDEX) 0.12 % solution Use as directed 15 mLs in the mouth or throat 2 (two) times daily. Rinse for 30 seconds after brushing, then spit.   ibuprofen (ADVIL) 800 MG tablet Take 800 mg by mouth every 6 (six) hours as needed (For pain).   risperiDONE (RISPERDAL) 2 MG tablet Take 2 mg by mouth at bedtime.   penicillin v potassium (VEETID) 500 MG tablet Take 500 mg by mouth 4 (four) times daily. Take for 14 days starting on 08/21/22.        No data to display          Flowsheet Row ED from 08/30/2022 in [facility] ED from 07/22/2022 in [facility] Department at [facility] (Dis[facility]  C-SSRS RISK CATEGORY Low Risk No Risk No Risk       Musculoskeletal  Strength & Muscle Tone: within normal limits Gait & Station: normal Patient leans: N/A  Psychiatric Specialty Exam  Presentation  General Appearance:  Appropriate for Environment  Eye Contact: Good  Speech: Clear and Coherent; Normal Rate  Speech Volume: Normal  Handedness: Right   Mood and Affect  Mood: Depressed; Anxious  Affect: Congruent; Flat   Thought Process  Thought Processes: Coherent; Goal Directed  Descriptions of Associations:Intact  Orientation:Full (Time, Place and Person)  Thought Content:Logical  Diagnosis of Schizophrenia or Schizoaffective disorder in past: Yes    Hallucinations:Hallucinations:  None  Ideas of Reference:None  Suicidal Thoughts:Suicidal Thoughts: Yes, Active SI Active Intent and/or Plan: With Intent; With Plan; With Means to Carry Out; With Access to Means  Homicidal Thoughts:Homicidal Thoughts: No   Sensorium  Memory: Immediate Good; Recent Good; Remote Good  Judgment: Fair  Insight: Fair   Art therapist  Concentration: Good  Attention Span: Good  Recall: Good  Fund of Knowledge: Good  Language: Good   Psychomotor Activity  Psychomotor Activity: Psychomotor Activity: Normal   Assets  Assets: Communication Skills; Desire for Improvement; Financial Resources/Insurance; Housing; Leisure Time; Physical Health; Resilience; Social Support   Sleep  Sleep: Sleep: Good Number of Hours of Sleep: 10   Nutritional Assessment (For OBS and FBC admissions only) Has the patient had a weight loss or gain of 10 pounds or more in the last 3 months?: No Has the patient had a decrease in food intake/or appetite?: Yes Does the patient have dental problems?: No Does the patient have eating habits or behaviors that may be indicators of an eating disorder including binging or inducing vomiting?: No Has the patient recently lost weight without trying?: 0 Has the patient been eating poorly because of a decreased appetite?: 1 Malnutrition Screening Tool Score: 1    Physical Exam  Physical Exam Vitals and nursing note reviewed.  Constitutional:      General: She is not in acute distress.    Appearance: Normal appearance. She is not ill-appearing.  HENT:     Head: Normocephalic and atraumatic.     Nose: Nose normal.  Eyes:     General:        Right eye: No discharge.        Left eye: No discharge.     Conjunctiva/sclera: Conjunctivae normal.  Cardiovascular:     Rate and Rhythm: Normal rate.  Pulmonary:     Effort: Pulmonary effort is normal. No respiratory distress.  Musculoskeletal:        General: Normal range of motion.      Cervical back: Normal range of motion.  Skin:    General: Skin is warm and dry.  Neurological:     General: No focal deficit present.     Mental Status: She is alert and oriented to person, place, and time. Mental status is at baseline.  Psychiatric:        Attention and Perception: Attention and perception normal.        Mood and Affect: Mood is anxious and depressed. Affect is flat.        Speech: Speech normal.        Behavior: Behavior normal. Behavior is cooperative.        Thought Content: Thought content is not paranoid or delusional. Thought content includes suicidal ideation. Thought content does not include homicidal ideation. Thought content includes suicidal plan. Thought content does not include homicidal plan.        Cognition and Memory: Cognition and memory normal.     Comments: Judgment: Fair    Review of Systems  Constitutional: Negative.   HENT: Negative.    Eyes: Negative.   Respiratory: Negative.    Cardiovascular: Negative.   Gastrointestinal: Negative.   Genitourinary: Negative.   Musculoskeletal: Negative.   Skin: Negative.   Neurological: Negative.   Endo/Heme/Allergies: Negative.   Psychiatric/Behavioral:  Positive for depression and suicidal ideas. Negative for hallucinations, memory loss and substance abuse. The patient is nervous/anxious. The patient does not have insomnia.    Blood pressure (!) 109/59, pulse (!) 56, temperature 98.5 F (36.9 C), resp. rate 18, SpO2 100%. There is no height or weight on file to calculate BMI.  Demographic Factors:  Caucasian  Loss Factors: NA  Historical Factors: Prior suicide attempts and Family history of mental illness or substance abuse  Risk Reduction Factors:   Sense of responsibility to family, Employed, Living with another person, especially a relative, Positive social support, and Positive therapeutic relationship  Continued Clinical Symptoms:  Bipolar Disorder:   Depressive phase Depression:    Severe Alcohol/Substance Abuse/Dependencies More than one psychiatric diagnosis Previous Psychiatric Diagnoses and Treatments  Cognitive Features That Contribute To  Risk:  None    Suicide Risk:  Extreme:  Frequent, intense, and enduring suicidal ideation, specific plans, clear subjective and objective intent, impaired self-control, severe dysphoria/symptomatology, many risk factors and no protective factors.  Plan Of Care/Follow-up recommendations:  Other:  -Continue Lamictal 25mg  BID -Continue Risperdal 1mg  daily at bedtime -Recommend inpatient psychiatric hospitalization  Disposition: Per Rona Ravens, RN/[facility] patient has been accepted to [facility] 300-1, accepting provider is Dr. Sherron Flemings. EMTALA completed and nursing staff notified. Patient will be transferred to [facility] via [facility].  Sunday Corn, NP 08/30/2022, 4:09 PM

## 2022-08-30 NOTE — ED Notes (Signed)
Patient admitted to obs. Patient A&Ox4. Independent with ADLS, steady gait. Patient presents appropriate to situation, endorses passive SI, depressed. Denies HI and A/V/H with an initial plan to hang herself with her scarf. Patient able to verbally contract for safety while on unit. Patient denies any pain or discomfort. Patient oriented to unit and provided with snacks. Patient aware of transfer to [facility]. No s/s of current distress.

## 2022-08-30 NOTE — Progress Notes (Signed)
Pt has been accepted to [facility] TODAY 08/30/2022, pending labs, EKG, and signed voluntary consent. Bed assignment: 300-1  Pt meets inpatient criteria per Erskine Emery, NP  Attending Physician will be Phineas Inches, MD  Report can be called to: - Adult unit: [phone]  Pt can arrive after pending items are received  Care Team Notified: [facility] Rona Ravens, RN, Roseanne Reno, RN, Erskine Emery, NP, Florentina Addison, RN, Earl Gala, LPN, and Feliberto Harts, RN  [place], [place]  08/30/2022 2:36 PM

## 2022-08-31 DIAGNOSIS — F332 Major depressive disorder, recurrent severe without psychotic features: Secondary | ICD-10-CM | POA: Diagnosis not present

## 2022-08-31 MED ORDER — SERTRALINE HCL 50 MG PO TABS
50.0000 mg | ORAL_TABLET | Freq: Every day | ORAL | Status: DC
  Administered 2022-08-31 – 2022-09-01 (×2): 50 mg via ORAL
  Filled 2022-08-31 (×5): qty 1

## 2022-08-31 MED ORDER — LAMOTRIGINE 25 MG PO TABS
50.0000 mg | ORAL_TABLET | Freq: Every day | ORAL | Status: DC
  Administered 2022-08-31: 50 mg via ORAL
  Filled 2022-08-31 (×3): qty 2

## 2022-08-31 MED ORDER — LAMOTRIGINE 25 MG PO TABS
25.0000 mg | ORAL_TABLET | Freq: Every day | ORAL | Status: DC
  Administered 2022-09-01: 25 mg via ORAL
  Filled 2022-08-31 (×3): qty 1

## 2022-08-31 NOTE — Plan of Care (Signed)
  Problem: Education: Goal: Knowledge of [place] [facility] Education information/materials will improve Outcome: Progressing Goal: Emotional status will improve Outcome: Progressing Goal: Mental status will improve Outcome: Progressing Goal: Verbalization of understanding the information provided will improve Outcome: Progressing   Problem: Activity: Goal: Interest or engagement in activities will improve Outcome: Progressing Goal: Sleeping patterns will improve Outcome: Progressing   Problem: Health Behavior/Discharge Planning: Goal: Identification of resources available to assist in meeting health care needs will improve Outcome: Progressing Goal: Compliance with treatment plan for underlying cause of condition will improve Outcome: Progressing   Problem: Physical Regulation: Goal: Ability to maintain clinical measurements within normal limits will improve Outcome: Progressing   Problem: Safety: Goal: Periods of time without injury will increase Outcome: Progressing

## 2022-08-31 NOTE — Progress Notes (Signed)
Chaplain engaged in an initial visit with Jennifer York. Jennifer York shared that she was feeling far from God even after prayer and calling out to God. Chaplain and Jennifer York discussed ways that God could be evident and present in her life right now. Jennifer York shared that she is grateful for her new apartment, her relationship with her boyfriend who cooks for her, and the support and love she has from her mom. Chaplain uplifted and affirmed what she shared. Chaplain also helped Jennifer York to uplift her decision to come for additional support and help from behavioral health. Chaplain worked to Systems developer and not discount the work that Jennifer York has done and [employer] and support that she does have. Chaplain helped Jennifer York illuminate the ways in which God can be present in what we have dismissed as minor or small things in our lives, as well as that God can show up in many different ways in our lives.   Jennifer York also voiced that she has been feeling a lack of joy/motivation in her life. Chaplain and Jennifer York thought about ways Jennifer York could institute joy and spiritual care practices in her life daily. Chaplain and Jennifer York discussed her starting a Warehouse manager, which she voiced would be helpful for her because she likes to have a plan. Jennifer York also shared that she is a lover of classic literature and holds two masters degrees. She thought about setting a goal for herself of memorizing a certain amount of poems per week. Chaplain asked Jennifer York what she could do to culminate her memorization of those poems and suggested her doing a journal activity of answering the following question: How do these poems I have memorized connect me to joy and God? Jennifer York voiced that she had been trying to find meaning in her practice of memorization. Chaplain and Jennifer York talked a great deal about building practices daily that can institute newness and change in her life. Chaplain also worked to help Jennifer York see herself in a time of discovery and  curiosity for herself and to find grace in that she has been trying to fight to live.   Chaplain assessed that Jennifer York has not held a lot of grace for herself in her fight to choose life. She recognizes that her family has a history of mental illness but that she seems to have it the worse because of her suicidal ideations. Chaplain highlighted the importance of Jennifer York's journey, changes in her medicine, and actively working to choose to live for herself.   Chaplain was able to say a prayer over Jennifer York to think about God's presence already existing in her life. Jennifer York also desires to get connected to a faith community. Chaplain provided streaming a faith service as a way to begin to reconnect. Jennifer York was thankful for the visit. Chaplain plans to print a Training and development officer for Jennifer York with some journaling prompts.     08/31/22 1100  Spiritual Encounters  Type of Visit Initial  Care provided to: Patient  Referral source Patient request  Reason for visit Routine spiritual support  Spiritual Framework  Presenting Themes Meaning/purpose/sources of inspiration;Goals in life/care;Values and beliefs;Significant life change;Rituals and practive  Community/Connection Family;Significant other  Needs/Challenges/Barriers Lack of motivation  Interventions  Spiritual Care Interventions Made Established relationship of care and support;Compassionate presence;Reflective listening;Normalization of emotions;Explored values/beliefs/practices/strengths;Encouragement;Prayer  Intervention Outcomes  Outcomes Awareness of support;Connection to spiritual care

## 2022-08-31 NOTE — Plan of Care (Signed)
  Problem: Education: Goal: Emotional status will improve Outcome: Progressing Goal: Mental status will improve Outcome: Progressing   Problem: Activity: Goal: Interest or engagement in activities will improve Outcome: Progressing Goal: Sleeping patterns will improve Outcome: Progressing   Problem: Coping: Goal: Ability to demonstrate self-control will improve Outcome: Progressing   Problem: Health Behavior/Discharge Planning: Goal: Compliance with treatment plan for underlying cause of condition will improve Outcome: Progressing   Problem: Physical Regulation: Goal: Ability to maintain clinical measurements within normal limits will improve Outcome: Progressing   Problem: Safety: Goal: Periods of time without injury will increase Outcome: Progressing

## 2022-08-31 NOTE — H&P (Addendum)
Psychiatric Admission Assessment Adult  Patient Identification: Jennifer York MRN:  [id number] Date of Evaluation:  08/31/2022 Chief Complaint:  MDD (major depressive disorder), recurrent severe, without psychosis (HCC) [F33.2] Principal Diagnosis: MDD (major depressive disorder), recurrent severe, without psychosis (HCC) Diagnosis:  Principal Problem:   MDD (major depressive disorder), recurrent severe, without psychosis (HCC)   CC: "brutal depression" Jennifer York is a 32 year old female with a past psychiatric history of MDD, GAD, recent Schizoaffective disorder (bipolar type) diagnosis, Cannabis-induced psychosis, Polysubstance use disorder (tobacco, alcohol, LSD, mushroom) and 2 prior psychiatric hospitalizations (most recent June 2024) who presented to [facility] voluntarily on 08/30/2022 with complaints of active suicide ideation with means to carry out plan. Her UDS is positive for THC on admission.  HPI:  Patient was evaluated on the unit.  She reports a 7-week history of worsening depression and suicidal ideation since her discharge from the change on June 2024.  She reports a history of chronic depression since she was 32 years old.  She reports that as of a week ago, her passive suicidal ideations became active with a plan to carry out an attempt.  Patient admits to searching on Google different methods of ending her life including attempting overdose by trazodone, jumping off a bridge, or using a scarf to hang herself over a door.  Patient endorses her suicidal ideations peak 2 days ago.  She currently describes her mood as "morose". Her symptoms of depression include low mood, anhedonia, hopelessness, diminished concentration, self-harm behavior and persistent suicidal ideation.  She reports experiencing suicidal ideation that started at the age of 47 and reports 1 previous suicide attempt in 2017.  Patient's recount of this attempt is somewhat vague, at one point describes having  obsessive thoughts about death and attempting to look for a gun but "I never found one".  Patient shares that her most recent attempt was 08/30/2022. Patient reports adequate psychiatric outpatient follow-up, but patient believes that her current psychiatrist does not take her symptoms of depression seriously.  Patient also report this a previous history of self-harm via cutting.  On chart review there is also descriptions of self-harm behavior via burning, biting, and pulling.  Patient reports that in her previous hospitalization at [facility] last month she was prescribed Risperdal and lamotrigine.  At that time she was being treated for what was suspected to be cannabis induced psychosis.  Patient reports that while being on Risperdal she has experienced galactorrhea and amenorrhea.  Patient is unable to identify of Lamictal has improved her mood.  She also reports previously trialing Wellbutrin and Zoloft (stopped in March 2024 because she thought she was "cured" of her depression during a period of psychosis).  She also reports an adverse effect to Abilify status post 5 days, initially describes it as TD, clarifies that she experienced an inner sense of restlessness consistent with akathisia and "locked jaw" consistent with dystonia.  Patient shares that one of her most concerning symptoms is her amotivation, reports "I am worried I will get the motivation to take care of myself, or that I will be discharged to fast before I find the will to live". Patient describes being able to talk about herself but having difficulty speaking to others, including her mom and partner, about other things and feeling like there's a "barrier in between me and reality" and feeling an absence of thoughts. Patient reports this inability to speak was a lot worse right after discharge from [facility] in June 2024, like she  was incapable of physically talk. On chart review done on 08/31/2022, patient's active suicidal ideation progressed on 8/6 to  making a noose with a scarf and tying the other end to a door knob and "I put my head in it and pulled it tight, I started to pass out then I kicked out of it".    Discussed patient's previous hospitalization in June at [facility] for treatment of persistent psychosis that had been ongoing for approximately 5 months (reports February to June).  Reports that this is when she was started on Risperdal at that time and had a unconfirmed diagnosis of schizoaffective disorder, bipolar type.  Patient shares that this diagnosis has caused her great distress.  She reports that during this 51-month period of psychosis she mostly experienced multiple delusional thought processes, reports that these perceptual disturbances were consistent with a 1.5-year period of cannabis use.  Patient attributes her symptoms of psychosis to cannabis, admits to trying cannabis once 3 to 4 weeks ago; during that time patient reports that her delusional thought processes and disorganization of thought started up again.  Her symptoms of psychosis have since resolved and have not recurred.  Psychiatric ROS Mood Symptoms: Persistent low mood; anhedonia; significant weight change or appetite disturbance; sleep disturbances; fatigue or loss of energy; feelings of worthlessness; difficulty concentrating; recurrent thoughts of death or suicide.  Manic Symptoms: Patient is unable to detail a history of manic episodes.  She seems to recall periods of expansive energy and mood characterized by feeling more "volatile".  Believes there is a seasonal pattern, becomes more energized and more impulsive during the spring.  She describes periods of increased mood, enhance energy and decreased need for sleep.  She is unsure if this is occurred this past spring.  Anxiety Symptoms:  Patient is unsure of a formal diagnosis of GAD.  Alludes to excessive anxiety and worry occurring for most days, that exacerbate her depression.  May be also tied to obsessions.   Panic attacks were not formally assessed.  Trauma Symptoms: not assessed today  Psychosis Symptoms: Denies any current symptoms of psychosis including hallucinations, paranoia, or delusional processes.  Previously reports prominent delusions including thought insertion, thought withdrawal, and thought broadcasting that improved with Risperdal.  Reports these occurred in the setting of cannabis use.  OCD: Reports experiencing "tics" since patient was between the ages of 31 and 36.  She had been trialing guanfacine since January, this medication was recently discontinued by her outpatient provider.  Patient reports improvement in tics while on this medication.  She describes having obsessions about her body movements having to be "even" with compulsions to even out any muscle contractions on one side of her body.  Eating disorder symptoms: Patient reports restrictive patterns of eating.  Shares that she feels that she is not worthy of eating at times.admits to being overly preoccupied with her weight.  Reports an ideal weight of 135 pounds.  She denies any binging or purging behavior currently or in the past.  Past Psychiatric Hx: Current Psychiatrist: [facility], started a month ago, has had 2 visits, patient is unsure if she would like to continue working with them. Current Therapist: [facility] in [place] Previous Psych Diagnoses: MDD, GAD, Schizoaffective disorder (bipolar type), Tic disorder Current psychiatric medications: Lamictal, risperidone Psychiatric medication history: Wellbutrin, Zoloft, Abilify Prior inpatient treatment: 2 (2017, 06/2022) Current/prior outpatient treatment: risperidone 1mg  daily, lamotrigine 25 mg BID Prior rehab hx: none on chart review Psychotherapy hx: Futures trader with [facility]  History of suicide: 2 (2017, 08/2022) History of homicide or aggression: denies Psychiatric medication compliance history: compliant per patient Neuromodulation history: none  on chart review  Substance Abuse Hx: Alcohol: started at age 60, drinking daily since age 17 (2 drinks, liquor). "It is hard to go a day without drinking". Denies withdrawal symptoms, including seizures. Tobacco: started at age 89, uses vape constantly Illicit drugs:  Rx drug abuse: denies Rehab hx: not assessed  Past Medical History: PCP: [facility] Dx: Hashimoto's Meds:Levothyroxine (not taking 08/2022) ALL: denies Hosp: denies Surgeries: denies Trauma: not assessed Seizures: denies  LMP: June 2024 Contraceptives: none, has scheduled appointment for Paraguard insertion next week (Aug. 2024)  Family History: Medical: father has a heart condition Psych: anxiety, depression Psych Rx: none on chart review SA/HA: father used to use cannabis Substance use family hx:  Social History: Living: in apartment with boyfriend, Thadius. Family (mother, father. Brother) lives 30 min away. 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 No.  Has the patient been a risk to self in the past 6 months? Yes.    Has the patient been a risk to self within the distant past? Yes.    Is the patient a risk to others? No.  Has the patient been a risk to others in the past 6 months? Yes.   On chart review today 08/31/2022, last psych hospitalization on June 2024 was due to IVC petitioned by mother who described the patient as "extremely combative toward family members. She has threatened to kill herself, mother, father, and brother.   Has the patient been a risk to others within the distant past? No.   [place] Scale:  Flowsheet Row [facility] (Current[facility] Most  recent reading at 08/30/2022  6:00 PM ED from 08/30/2022 in [facility] Most recent reading at 08/30/2022  3:29 PM ED from 07/22/2022 in [facility] Emergency Department at [facility] Most recent reading at 07/22/2022  2:09 PM  C-SSRS RISK CATEGORY Low Risk Low Risk No Risk        Tobacco Screening:  Social History   Tobacco Use  Smoking Status Every Day   Current packs/day: 1.00   Types: Cigarettes  Smokeless Tobacco Current   Types: Chew    BH Tobacco Counseling     Are you interested in Tobacco Cessation Medications?  No, patient refused Counseled patient on smoking cessation:  Refused/Declined practical counseling Reason Tobacco Screening Not Completed: Patient Refused Screening       Social History:  Social History   Substance and Sexual Activity  Alcohol Use Yes   Comment: drinks 'occasionally'     Social History   Substance and Sexual Activity  Drug Use Yes   Types: Marijuana   Comment: smokes weed 'occasionally'    Additional Social History: Marital status: Long term relationship Long term relationship, how long?: 1 year What types of issues is patient dealing with in the relationship?: Patient states that partner is very supportive Additional relationship information: NA What is your sexual orientation?: did not assess Does patient have children?: No      Allergies:   Allergies  Allergen Reactions   Gluten Meal Other (See Comments)  Intolerance - Will Not Trigger Allergy Alert   Lab Results:  Results for orders placed or performed during the hospital encounter of 08/30/22 (from the past 48 hour(s))  CBC with Differential/Platelet     Status: None   Collection Time: 08/30/22  1:15 PM  Result Value Ref Range   WBC 9.0 4.0 - 10.5 K/uL   RBC 4.35 3.87 - 5.11 MIL/uL   Hemoglobin 14.0 12.0 - 15.0 g/dL   HCT 09.8 11.9 - 14.7 %   MCV 92.4 80.0 - 100.0 fL   MCH 32.2 26.0 - 34.0 pg   MCHC 34.8 30.0 - 36.0 g/dL   RDW  82.9 56.2 - 13.0 %   Platelets 235 150 - 400 K/uL   nRBC 0.0 0.0 - 0.2 %   Neutrophils Relative % 79 %   Neutro Abs 7.0 1.7 - 7.7 K/uL   Lymphocytes Relative 17 %   Lymphs Abs 1.6 0.7 - 4.0 K/uL   Monocytes Relative 4 %   Monocytes Absolute 0.4 0.1 - 1.0 K/uL   Eosinophils Relative 0 %   Eosinophils Absolute 0.0 0.0 - 0.5 K/uL   Basophils Relative 0 %   Basophils Absolute 0.0 0.0 - 0.1 K/uL   Immature Granulocytes 0 %   Abs Immature Granulocytes 0.03 0.00 - 0.07 K/uL    Comment: Performed at [facility], [address], [place], [place] [address]  Comprehensive metabolic panel     Status: Abnormal   Collection Time: 08/30/22  1:15 PM  Result Value Ref Range   Sodium 138 135 - 145 mmol/L   Potassium 3.7 3.5 - 5.1 mmol/L   Chloride 102 98 - 111 mmol/L   CO2 28 22 - 32 mmol/L   Glucose, Bld 133 (H) 70 - 99 mg/dL    Comment: Glucose reference range applies only to samples taken after fasting for at least 8 hours.   BUN 8 6 - 20 mg/dL   Creatinine, Ser 4.69 0.44 - 1.00 mg/dL   Calcium 9.6 8.9 - 62.9 mg/dL   Total Protein 7.1 6.5 - 8.1 g/dL   Albumin 4.5 3.5 - 5.0 g/dL   AST 19 15 - 41 U/L   ALT 15 0 - 44 U/L   Alkaline Phosphatase 56 38 - 126 U/L   Total Bilirubin 1.1 0.3 - 1.2 mg/dL   GFR, Estimated >52 >84 mL/min    Comment: (NOTE) Calculated using the CKD-EPI Creatinine Equation (2021)    Anion gap 8 5 - 15    Comment: Performed at [facility], [address] [place], [place] [address]  Hemoglobin A1c     Status: None   Collection Time: 08/30/22  1:15 PM  Result Value Ref Range   Hgb A1c MFr Bld 5.3 4.8 - 5.6 %    Comment: (NOTE) Pre diabetes:          5.7%-6.4%  Diabetes:              >6.4%  Glycemic control for   <7.0% adults with diabetes    Mean Plasma Glucose 105.41 mg/dL    Comment: Performed at [facility], [address], [place], [place] [address]  Magnesium     Status: None   Collection Time: 08/30/22  1:15 PM  Result Value Ref  Range   Magnesium 2.0 1.7 - 2.4 mg/dL    Comment: Performed at [facility], [address], [place], [place] [address]  Ethanol  Status: None   Collection Time: 08/30/22  1:15 PM  Result Value Ref Range   Alcohol, Ethyl (B) <10 <10 mg/dL    Comment: (NOTE) Lowest detectable limit for serum alcohol is 10 mg/dL.  For medical purposes only. Performed at [facility], [address], [place], [place] [address]   Lipid panel     Status: Abnormal   Collection Time: 08/30/22  1:15 PM  Result Value Ref Range   Cholesterol 205 (H) 0 - 200 mg/dL   Triglycerides 43 <604 mg/dL   HDL 86 >54 mg/dL   Total CHOL/HDL Ratio 2.4 RATIO   VLDL 9 0 - 40 mg/dL   LDL Cholesterol 098 (H) 0 - 99 mg/dL    Comment:        Total Cholesterol/HDL:CHD Risk Coronary Heart Disease Risk Table                     Men   Women  1/2 Average Risk   3.4   3.3  Average Risk       5.0   4.4  2 X Average Risk   9.6   7.1  3 X Average Risk  23.4   11.0        Use the calculated Patient Ratio above and the CHD Risk Table to determine the patient's CHD Risk.        ATP III CLASSIFICATION (LDL):  <100     mg/dL   Optimal  119-147  mg/dL   Near or Above                    Optimal  130-159  mg/dL   Borderline  829-562  mg/dL   High  >130     mg/dL   Very High Performed at [facility], [address], [place], [place] [address]   Prolactin     Status: Abnormal   Collection Time: 08/30/22  1:15 PM  Result Value Ref Range   Prolactin 34.9 (H) 4.8 - 33.4 ng/mL    Comment: (NOTE) Performed At: [facility] [address] [place], [place] [id number] Jolene Schimke MD UX:[id number]   TSH     Status: None   Collection Time: 08/30/22  1:15 PM  Result Value Ref Range   TSH 1.378 0.350 - 4.500 uIU/mL    Comment: Performed by a 3rd Generation assay with a functional sensitivity of <=0.01 uIU/mL. Performed at [facility], [address], [place], [place] [address]   POC urine  preg, ED     Status: Normal   Collection Time: 08/30/22  1:26 PM  Result Value Ref Range   Preg Test, Ur Negative Negative  POCT Urine Drug Screen - (I-Screen)     Status: Normal   Collection Time: 08/30/22  1:26 PM  Result Value Ref Range   POC Amphetamine UR None Detected NONE DETECTED (Cut Off Level 1000 ng/mL)   POC Secobarbital (BAR) None Detected NONE DETECTED (Cut Off Level 300 ng/mL)   POC Buprenorphine (BUP) None Detected NONE DETECTED (Cut Off Level 10 ng/mL)   POC Oxazepam (BZO) None Detected NONE DETECTED (Cut Off Level 300 ng/mL)   POC Cocaine UR None Detected NONE DETECTED (Cut Off Level 300 ng/mL)   POC Methamphetamine UR None Detected NONE DETECTED (Cut Off Level 1000 ng/mL)   POC Morphine None Detected NONE DETECTED (Cut Off Level 300 ng/mL)   POC Methadone UR None Detected NONE DETECTED (Cut  Off Level 300 ng/mL)   POC Oxycodone UR None Detected NONE DETECTED (Cut Off Level 100 ng/mL)   POC Marijuana UR None Detected NONE DETECTED (Cut Off Level 50 ng/mL)  Pregnancy, urine POC     Status: None   Collection Time: 08/30/22  2:22 PM  Result Value Ref Range   Preg Test, Ur NEGATIVE NEGATIVE    Comment:        THE SENSITIVITY OF THIS METHODOLOGY IS >24 mIU/mL     Blood Alcohol level:  Lab Results  Component Value Date   ETH <10 08/30/2022   ETH <10 05/18/2022    Metabolic Disorder Labs:  Lab Results  Component Value Date   HGBA1C 5.3 08/30/2022   MPG 105.41 08/30/2022   MPG 91.06 07/05/2022   Lab Results  Component Value Date   PROLACTIN 34.9 (H) 08/30/2022   PROLACTIN 17.3 05/18/2022   Lab Results  Component Value Date   CHOL 205 (H) 08/30/2022   TRIG 43 08/30/2022   HDL 86 08/30/2022   CHOLHDL 2.4 08/30/2022   VLDL 9 08/30/2022   LDLCALC 110 (H) 08/30/2022   LDLCALC 67 07/05/2022    Current Medications: Current Facility-Administered Medications  Medication Dose Route Frequency Provider Last Rate Last Admin   acetaminophen (TYLENOL) tablet 650  mg  650 mg Oral Q6H PRN Sunday Corn, NP       alum & mag hydroxide-simeth (MAALOX/MYLANTA) 200-200-20 MG/5ML suspension 30 mL  30 mL Oral Q4H PRN Sunday Corn, NP       diphenhydrAMINE (BENADRYL) capsule 50 mg  50 mg Oral TID PRN Sunday Corn, NP       Or   diphenhydrAMINE (BENADRYL) injection 50 mg  50 mg Intramuscular TID PRN Sunday Corn, NP       haloperidol (HALDOL) tablet 5 mg  5 mg Oral TID PRN Sunday Corn, NP       Or   haloperidol lactate (HALDOL) injection 5 mg  5 mg Intramuscular TID PRN Sunday Corn, NP       hydrOXYzine (ATARAX) tablet 25 mg  25 mg Oral TID PRN Sunday Corn, NP   25 mg at 08/31/22 1455   [START ON 09/01/2022] lamoTRIgine (LAMICTAL) tablet 25 mg  25 mg Oral Daily Carrion-Carrero, Leylah Tarnow, MD       lamoTRIgine (LAMICTAL) tablet 50 mg  50 mg Oral QHS Carrion-Carrero, Kahley Leib, MD       LORazepam (ATIVAN) tablet 2 mg  2 mg Oral TID PRN Sunday Corn, NP       Or   LORazepam (ATIVAN) injection 2 mg  2 mg Intramuscular TID PRN Sunday Corn, NP       magnesium hydroxide (MILK OF MAGNESIA) suspension 30 mL  30 mL Oral Daily PRN Sunday Corn, NP       nicotine (NICODERM CQ - dosed in mg/24 hours) patch 21 mg  21 mg Transdermal Daily Massengill, Harrold Donath, MD   21 mg at 08/30/22 1910   sertraline (ZOLOFT) tablet 50 mg  50 mg Oral Daily Carrion-Carrero, Lenn Volker, MD       traZODone (DESYREL) tablet 50 mg  50 mg Oral QHS PRN Sunday Corn, NP       PTA Medications: Medications Prior to Admission  Medication Sig Dispense Refill Last Dose   chlorhexidine (PERIDEX) 0.12 % solution Use as directed 15 mLs in the mouth or throat 2 (two) times daily. Rinse for 30 seconds after brushing, then spit.  hydrOXYzine (ATARAX) 25 MG tablet Take 1 tablet (25 mg total) by mouth 3 (three) times daily as needed for anxiety. 30 tablet 0    ibuprofen (ADVIL) 800 MG tablet Take 800 mg by mouth every 6 (six) hours as needed (For pain).       lamoTRIgine (LAMICTAL) 25 MG tablet Take 1 tablet (25 mg total) by mouth every 12 (twelve) hours. 60 tablet 0    levothyroxine (SYNTHROID) 100 MCG tablet Take 100 mcg by mouth daily before breakfast. (Patient not taking: Reported on 08/30/2022)      oxyCODONE-acetaminophen (PERCOCET/ROXICET) 5-325 MG tablet Take 1 tablet by mouth every 6 (six) hours as needed for severe pain. 6 tablet 0    penicillin v potassium (VEETID) 500 MG tablet Take 500 mg by mouth 4 (four) times daily. Take for 14 days starting on 08/21/22.      risperiDONE (RISPERDAL) 2 MG tablet Take 2 mg by mouth at bedtime.       Musculoskeletal: Strength & Muscle Tone: not assessed Gait & Station: not assessed Patient leans: not assessed  Psychiatric Specialty Exam:  Presentation  General Appearance:  Appropriate for Environment  Eye Contact: Good  Speech: Clear and Coherent; Normal Rate  Speech Volume: Normal  Handedness: -- (not assessed)   Mood and Affect  Mood: -- ("morose")  Affect: Full Range   Thought Process  Thought Processes: Coherent  Descriptions of Associations: Intact  Orientation: Full (Time, Place and Person)  Thought Content: Logical; Tangential  History of Schizophrenia/Schizoaffective disorder: Yes  Duration of Psychotic Symptoms:N/A Hallucinations: Hallucinations: None  Ideas of Reference: None  Suicidal Thoughts: Suicidal Thoughts: Yes, Passive SI Active Intent and/or Plan: With Intent; With Plan; With Means to Carry Out; With Access to Means SI Passive Intent and/or Plan: Without Intent; Without Means to Carry Out  Homicidal Thoughts: Homicidal Thoughts: No   Sensorium  Memory: Immediate Good; Recent Fair; Remote Fair  Judgment: Fair  Insight: Fair   Chartered certified accountant: Fair  Attention Span: Fair  Recall: [employer] of Knowledge: Fair  Language: Good   Psychomotor Activity  Psychomotor Activity: Psychomotor Activity:  Normal   Assets  Assets: Communication Skills; Social Support; Resilience   Sleep  Sleep: Sleep: Fair Number of Hours of Sleep: 10    Physical Exam: Physical Exam Vitals reviewed.  Constitutional:      General: She is not in acute distress.    Appearance: Normal appearance.  HENT:     Head: Normocephalic and atraumatic.  Pulmonary:     Effort: Pulmonary effort is normal.  Musculoskeletal:        General: Normal range of motion.  Neurological:     General: No focal deficit present.    Review of Systems  Constitutional:  Negative for diaphoresis.  Musculoskeletal:  Negative for myalgias.  Psychiatric/Behavioral:  Positive for depression and suicidal ideas. Negative for hallucinations and memory loss. The patient does not have insomnia.    Blood pressure (!) 102/57, pulse 73, temperature 98.2 F (36.8 C), temperature source Oral, resp. rate 16, height 5\' 7"  (1.702 m), weight 65.2 kg, SpO2 99%. Body mass index is 22.52 kg/m.   Treatment Plan Summary: Daily contact with patient to assess and evaluate symptoms and progress in treatment and Medication management   ASSESSMENT: Jennifer York is a 32 year old female with a past psychiatric history of MDD, GAD, recent Schizoaffective disorder (bipolar type) diagnosis, Cannabis-induced psychosis, Polysubstance use disorder (tobacco, alcohol, LSD, mushroom) and 2 prior psychiatric  hospitalizations (most recent June 2024) who presented to [facility] voluntarily on 8/7 with complaints of active suicide ideation with means to carry out plan.   Patient's symptoms are consistent with a tentative diagnosis of bipolar 1 disorder, current episode depressed.  Patient is unable to detail or specify manic symptoms, but does appear to have periods of expansive energy and mood.  She also appears to meet criteria for the diagnosis of GAD, given patient's report of excessive anxiety and worry associated with somatic symptoms. Patient  has a history of polysubstance use, but has mostly used cannabis.  Patient does mention a previous diagnosis of schizoaffective disorder, currently do not think this is the most accurate diagnosis given the onset of psychosis is congruent with the onset of cannabis use.   Diagnoses / Active Problems: Tentative diagnosis of bipolar 1 disorder, current episode depressed Generalized anxiety disorder Rule out OCD Cannabis use disorder Cannabis induced psychosis Rule out eating disorder  PLAN: Safety and Monitoring:  --  Voluntary admission to inpatient psychiatric unit for safety, stabilization and treatment  -- Daily contact with patient to assess and evaluate symptoms and progress in treatment  -- Patient's case to be discussed in multi-disciplinary team meeting  -- Observation Level : q15 minute checks  -- Vital signs:  q12 hours  -- Precautions: suicide, elopement, and assault  2. Psychiatric Diagnoses and Treatment:  Discontinue Risperdal Order repeat prolactin for today, may repeat by discharge Increase Lamictal 25 mg twice daily to Lamictal 25 mg in the morning + Lamictal 50 mg nightly, for mood stabilization Start Zoloft 50 mg daily, per patient's request for management of her depression Continue to assess for symptoms of mania while on SSRI Start trazodone 50 mg nightly as needed for insomnia -- The risks/benefits/side-effects/alternatives to this medication were discussed in detail with the patient and time was given for questions. The patient consents to medication trial.              -- Metabolic profile and EKG monitoring obtained while on an atypical antipsychotic  BMI: 22.52 kg/m TSH: 1.378 on 8/7 Lipid Panel: Cholesterol 205 and LDL 110 on 8/7 HbgA1c: 5.3 on 8/7 QTc: 456 on 8/7             -- Encouraged patient to participate in unit milieu and in scheduled group therapies   -- Short Term Goals: Ability to identify changes in lifestyle to reduce recurrence of condition  will improve and Ability to verbalize feelings will improve  -- Long Term Goals: Improvement in symptoms so as ready for discharge Other PRNS   3. Medical Issues Being Addressed:   #Tobacco Use Disorder Nicotine patch 21mg /24 hours ordered Smoking cessation encouraged  #Hypothyroidism TSH WNL on 08/30/2022  4. Discharge Planning:   -- Social work and case management to assist with discharge planning and identification of hospital follow-up needs prior to discharge  -- Estimated LOS: 5-7 days  -- Discharge Concerns: Need to establish a safety plan; Medication compliance and effectiveness  -- Discharge Goals: Return home with outpatient referrals for mental health follow-up including medication management/psychotherapy   I certify that inpatient services furnished can reasonably be expected to improve the patient's condition.   This note was created using a voice recognition software as a result there may be grammatical errors inadvertently enclosed that do not reflect the nature of this encounter. Every attempt is made to correct such errors.   Cleotis Nipper MS3  I personally was present and  performed or re-performed the history, physical exam and medical decision-making activities of this service and have verified that the service and findings are accurately documented in the student's note.   Signed: Dr. Liston Alba, MD PGY-2, Psychiatry Residency  8/8/20243:14 PM

## 2022-08-31 NOTE — Progress Notes (Signed)
   08/31/22 0556  15 Minute Checks  Location Bedroom  Visual Appearance Calm  Behavior Sleeping  Sleep (Behavioral Health Patients Only)  Calculate sleep? (Click Yes once per 24 hr at 0600 safety check) Yes  Documented sleep last 24 hours 6.25
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
## 2022-08-31 NOTE — BHH Suicide Risk Assessment (Signed)
Suicide Risk Assessment  Admission Assessment    [facility] Admission Suicide Risk Assessment   Nursing information obtained from:    Demographic factors:  Caucasian, Low socioeconomic status Current Mental Status:  Suicidal ideation indicated by patient Loss Factors:  Financial problems / change in socioeconomic status Historical Factors:  Prior suicide attempts Risk Reduction Factors:  Positive social support  Total Time spent with patient: 2 hours Principal Problem: MDD (major depressive disorder), recurrent severe, without psychosis (HCC) Diagnosis:  Principal Problem:   MDD (major depressive disorder), recurrent severe, without psychosis (HCC)   Subjective Data:   CC: "brutal depression" Jennifer York is a 32 year old female with a past psychiatric history of MDD, GAD, recent Schizoaffective disorder (bipolar type) diagnosis, Cannabis-induced psychosis, Polysubstance use disorder (tobacco, alcohol, LSD, mushroom) and 2 prior psychiatric hospitalizations (most recent June 2024) who presented to [facility] voluntarily on 08/30/2022 with complaints of active suicide ideation with means to carry out plan. Her UDS is positive for THC on admission.   HPI:  Patient was evaluated on the unit.  She reports a 7-week history of worsening depression and suicidal ideation since her discharge from the change on June 2024.  She reports a history of chronic depression since she was 32 years old.  She reports that as of a week ago, her passive suicidal ideations became active with a plan to carry out an attempt.  Patient admits to searching on Google different methods of ending her life including attempting overdose by trazodone, jumping off a bridge, or using a scarf to hang herself over a door.  Patient endorses her suicidal ideations peak 2 days ago.  She currently describes her mood as "morose". Her symptoms of depression include low mood, anhedonia, hopelessness, diminished concentration,  self-harm behavior and persistent suicidal ideation.  She reports experiencing suicidal ideation that started at the age of 42 and reports 1 previous suicide attempt in 2017.  Patient's recount of this attempt is somewhat vague, at one point describes having obsessive thoughts about death and attempting to look for a gun but "I never found one".  Patient shares that her most recent attempt was 08/30/2022. Patient reports adequate psychiatric outpatient follow-up, but patient believes that her current psychiatrist does not take her symptoms of depression seriously.   Patient also report this a previous history of self-harm via cutting.  On chart review there is also descriptions of self-harm behavior via burning, biting, and pulling.  Patient reports that in her previous hospitalization at [facility] last month she was prescribed Risperdal and lamotrigine.  At that time she was being treated for what was suspected to be cannabis induced psychosis.  Patient reports that while being on Risperdal she has experienced galactorrhea and amenorrhea.  Patient is unable to identify of Lamictal has improved her mood.  She also reports previously trialing Wellbutrin and Zoloft (stopped in March 2024 because she thought she was "cured" of her depression during a period of psychosis).  She also reports an adverse effect to Abilify status post 5 days, initially describes it as TD, clarifies that she experienced an inner sense of restlessness consistent with akathisia and "locked jaw" consistent with dystonia.   Patient shares that one of her most concerning symptoms is her amotivation, reports "I am worried I will get the motivation to take care of myself, or that I will be discharged to fast before I find the will to live". Patient describes being able to talk about herself but having difficulty speaking  to others, including her mom and partner, about other things and feeling like there's a "barrier in between me and reality" and  feeling an absence of thoughts. Patient reports this inability to speak was a lot worse right after discharge from [facility] in June 2024, like she was incapable of physically talk. On chart review done on 08/31/2022, patient's active suicidal ideation progressed on 8/6 to making a noose with a scarf and tying the other end to a door knob and "I put my head in it and pulled it tight, I started to pass out then I kicked out of it".      Discussed patient's previous hospitalization in June at [facility] for treatment of persistent psychosis that had been ongoing for approximately 5 months (reports February to June).  Reports that this is when she was started on Risperdal at that time and had a unconfirmed diagnosis of schizoaffective disorder, bipolar type.  Patient shares that this diagnosis has caused her great distress.  She reports that during this 43-month period of psychosis she mostly experienced multiple delusional thought processes, reports that these perceptual disturbances were consistent with a 1.5-year period of cannabis use.  Patient attributes her symptoms of psychosis to cannabis, admits to trying cannabis once 3 to 4 weeks ago; during that time patient reports that her delusional thought processes and disorganization of thought started up again.  Her symptoms of psychosis have since resolved and have not recurred.   Psychiatric ROS Mood Symptoms: Persistent low mood; anhedonia; significant weight change or appetite disturbance; sleep disturbances; fatigue or loss of energy; feelings of worthlessness; difficulty concentrating; recurrent thoughts of death or suicide.   Manic Symptoms: Patient is unable to detail a history of manic episodes.  She seems to recall periods of expansive energy and mood characterized by feeling more "volatile".  Believes there is a seasonal pattern, becomes more energized and more impulsive during the spring.  She describes periods of increased mood, enhance energy and decreased need  for sleep.  She is unsure if this is occurred this past spring.   Anxiety Symptoms:  Patient is unsure of a formal diagnosis of GAD.  Alludes to excessive anxiety and worry occurring for most days, that exacerbate her depression.  May be also tied to obsessions.  Panic attacks were not formally assessed.   Trauma Symptoms: not assessed today   Psychosis Symptoms: Denies any current symptoms of psychosis including hallucinations, paranoia, or delusional processes.  Previously reports prominent delusions including thought insertion, thought withdrawal, and thought broadcasting that improved with Risperdal.  Reports these occurred in the setting of cannabis use.   OCD: Reports experiencing "tics" since patient was between the ages of 3 and 60.  She had been trialing guanfacine since January, this medication was recently discontinued by her outpatient provider.  Patient reports improvement in tics while on this medication.  She describes having obsessions about her body movements having to be "even" with compulsions to even out any muscle contractions on one side of her body.   Eating disorder symptoms: Patient reports restrictive patterns of eating.  Shares that she feels that she is not worthy of eating at times.admits to being overly preoccupied with her weight.  Reports an ideal weight of 135 pounds.  She denies any binging or purging behavior currently or in the past.   Past Psychiatric Hx: Current Psychiatrist: [facility], started a month ago, has had 2 visits, patient is unsure if she would like to continue working with them. Current  Therapist: [facility] in [place] Previous Psych Diagnoses: MDD, GAD, Schizoaffective disorder (bipolar type), Tic disorder Current psychiatric medications: Lamictal, risperidone Psychiatric medication history: Wellbutrin, Zoloft, Abilify Prior inpatient treatment: 2 (2017, 06/2022) Current/prior outpatient treatment: risperidone 1mg  daily, lamotrigine 25 mg  BID Prior rehab hx: none on chart review Psychotherapy hx: Futures trader with [facility] History of suicide: 2 (2017, 08/2022) History of homicide or aggression: denies Psychiatric medication compliance history: compliant per patient Neuromodulation history: none on chart review   Substance Abuse Hx: Alcohol: started at age 71, drinking daily since age 67 (2 drinks, liquor). "It is hard to go a day without drinking". Denies withdrawal symptoms, including seizures. Tobacco: started at age 48, uses vape constantly Illicit drugs:  Rx drug abuse: denies Rehab hx: not assessed   Past Medical History: PCP: [facility] Dx: Hashimoto's Meds:Levothyroxine (not taking 08/2022) ALL: denies Hosp: denies Surgeries: denies Trauma: not assessed Seizures: denies   LMP: June 2024 Contraceptives: none, has scheduled appointment for Paraguard insertion next week (Aug. 2024)   Family History: Medical: father has a heart condition Psych: anxiety, depression Psych Rx: none on chart review SA/HA: father used to use cannabis Substance use family hx:   Social History: Living: in apartment with boyfriend, Thadius. Family (mother, father. Brother) lives 30 min away. 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  World Science writer [facility]). Score between 0-7:  no or low risk or alcohol related problems. Score between 8-15:  moderate risk of alcohol related problems. Score between 16-19:  high risk of alcohol related problems. Score 20 or above:  warrants further diagnostic evaluation for  alcohol dependence and treatment.   CLINICAL FACTORS:   Bipolar Disorder:   Depressive phase More than one psychiatric diagnosis Previous Psychiatric Diagnoses and Treatments   Musculoskeletal: Strength & Muscle Tone: not assessed Gait & Station: not assessed Patient leans: not assessed   Psychiatric Specialty Exam:   Presentation  General Appearance:  Appropriate for Environment   Eye Contact: Good   Speech: Clear and Coherent; Normal Rate   Speech Volume: Normal   Handedness: -- (not assessed)     Mood and Affect  Mood: -- ("morose")   Affect: Full Range     Thought Process  Thought Processes: Coherent   Descriptions of Associations: Intact   Orientation: Full (Time, Place and Person)   Thought Content: Logical; Tangential   History of Schizophrenia/Schizoaffective disorder: Yes   Duration of Psychotic Symptoms:N/A Hallucinations: Hallucinations: None   Ideas of Reference: None   Suicidal Thoughts: Suicidal Thoughts: Yes, Passive SI Active Intent and/or Plan: With Intent; With Plan; With Means to Carry Out; With Access to Means SI Passive Intent and/or Plan: Without Intent; Without Means to Carry Out   Homicidal Thoughts: Homicidal Thoughts: No     Sensorium  Memory: Immediate Good; Recent Fair; Remote Fair   Judgment: Fair   Insight: Fair     Chartered certified accountant: Fair   Attention Span: Fair   Recall: [employer] of Knowledge: Fair   Language: Good     Psychomotor Activity  Psychomotor Activity: Psychomotor Activity: Normal     Assets  Assets: Communication Skills;  Social Support; Resilience     Sleep  Sleep: Sleep: Fair Number of Hours of Sleep: 10       Physical Exam: Physical Exam Vitals reviewed.  Constitutional:      General: She is not in acute distress.    Appearance: Normal appearance.  HENT:     Head: Normocephalic and atraumatic.  Pulmonary:     Effort: Pulmonary  effort is normal.  Musculoskeletal:        General: Normal range of motion.  Neurological:     General: No focal deficit present.      Review of Systems  Constitutional:  Negative for diaphoresis.  Musculoskeletal:  Negative for myalgias.  Psychiatric/Behavioral:  Positive for depression and suicidal ideas. Negative for hallucinations and memory loss. The patient does not have insomnia.     Blood pressure (!) 102/57, pulse 73, temperature 98.2 F (36.8 C), temperature source Oral, resp. rate 16, height 5\' 7"  (1.702 m), weight 65.2 kg, SpO2 99%. Body mass index is 22.52 kg/m.   COGNITIVE FEATURES THAT CONTRIBUTE TO RISK:  None    SUICIDE RISK:   Moderate:  Frequent suicidal ideation with limited intensity, and duration, some specificity in terms of plans, no associated intent, good self-control, limited dysphoria/symptomatology, some risk factors present, and identifiable protective factors, including available and accessible social support.  PLAN OF CARE: See H&P for assessment and plan.   I certify that inpatient services furnished can reasonably be expected to improve the patient's condition.   Lorri Frederick, MD 08/31/2022, 8:33 AM

## 2022-08-31 NOTE — BHH Group Notes (Signed)
Adult Psychoeducational Group Note  Date:  08/31/2022 Time:  9:38 PM  Group Topic/Focus:  Wrap-Up Group:   The focus of this group is to help patients review their daily goal of treatment and discuss progress on daily workbooks.  Participation Level:  Active  Participation Quality:  Appropriate  Affect:  Appropriate  Cognitive:  Appropriate  Insight: Appropriate  Engagement in Group:  Engaged  Modes of Intervention:  Discussion and Support  Additional Comments:  Pt told that today was a good day on the unit, the highlight of which was feeling her natural curiosity return, which her depression had previously stifled. Pt also mentioned wanting to have her diagnosis re-evaluated, which she was already speaking to her treatment team about. Pt rated her day a 4 out of 10.  Christ Kick 08/31/2022, 9:38 PM

## 2022-08-31 NOTE — Progress Notes (Signed)
I assumed care for Jennifer York at about 07:30.  She was resting in her bed, denied any new pain, reports feeling tired, vitals significant for hypotension from earlier this am, she denied any light headedness, chest pain, feeling dizzy or any shortness of breath. She denied any avh/hi/si at the time of my assessment. PO fluids intake encouraged. She received prn hydroxyzine for anxiety, effective on follow up.She attended groups, vital signs improved on recheck. She reports decreased depression relative to yesterday. She is being monitored as ordered.

## 2022-08-31 NOTE — Progress Notes (Signed)
   08/31/22 2120  Psych Admission Type (Psych Patients Only)  Admission Status Voluntary  Psychosocial Assessment  Patient Complaints Anxiety;Depression  Eye Contact Fair  Facial Expression Anxious  Affect Appropriate to circumstance  Speech Soft;Logical/coherent  Interaction Assertive  Motor Activity Other (Comment) (wnl)  Appearance/Hygiene Unremarkable  Behavior Characteristics Cooperative;Anxious  Mood Pleasant  Thought Process  Coherency WDL  Content WDL  Delusions None reported or observed  Perception WDL  Hallucination None reported or observed  Judgment WDL  Confusion None  Danger to Self  Current suicidal ideation? Denies  Agreement Not to Harm Self Yes  Description of Agreement verbal  Danger to Others  Danger to Others None reported or observed   Progress note   D: Pt seen at nurse's station. Pt denies SI, HI, AVH. Pt rates pain  0/10. Pt rates anxiety  6/10 and depression  6/10. Reports changes have been made to her medications. Still feels like her focus is off. Did say that one good thing about her day is that she could concentrate enough to finish a book that she had been reading. Pt is gluten free and said that the food options today were adequate for her. No other concerns noted at this time.  A: Pt provided support and encouragement. Pt given scheduled medication as prescribed. PRNs as appropriate. Q15 min checks for safety.   R: Pt safe on the unit. Will continue to monitor.

## 2022-09-01 ENCOUNTER — Encounter (HOSPITAL_COMMUNITY): Payer: Self-pay

## 2022-09-01 DIAGNOSIS — F332 Major depressive disorder, recurrent severe without psychotic features: Secondary | ICD-10-CM | POA: Diagnosis not present

## 2022-09-01 MED ORDER — BUSPIRONE HCL 5 MG PO TABS
5.0000 mg | ORAL_TABLET | Freq: Three times a day (TID) | ORAL | Status: AC
  Administered 2022-09-01 – 2022-09-02 (×3): 5 mg via ORAL
  Filled 2022-09-01 (×5): qty 1

## 2022-09-01 MED ORDER — TRAZODONE HCL 50 MG PO TABS
50.0000 mg | ORAL_TABLET | Freq: Every day | ORAL | Status: DC
  Administered 2022-09-01 – 2022-09-04 (×2): 50 mg via ORAL
  Filled 2022-09-01 (×8): qty 1

## 2022-09-01 MED ORDER — LAMOTRIGINE 25 MG PO TABS
50.0000 mg | ORAL_TABLET | Freq: Two times a day (BID) | ORAL | Status: DC
  Administered 2022-09-01 – 2022-09-05 (×8): 50 mg via ORAL
  Filled 2022-09-01 (×13): qty 2

## 2022-09-01 MED ORDER — BUSPIRONE HCL 10 MG PO TABS
10.0000 mg | ORAL_TABLET | Freq: Three times a day (TID) | ORAL | Status: DC
  Administered 2022-09-02 – 2022-09-03 (×2): 10 mg via ORAL
  Filled 2022-09-01 (×5): qty 1

## 2022-09-01 MED ORDER — HYDROXYZINE HCL 50 MG PO TABS
50.0000 mg | ORAL_TABLET | Freq: Three times a day (TID) | ORAL | Status: DC | PRN
  Administered 2022-09-01 – 2022-09-02 (×2): 50 mg via ORAL
  Filled 2022-09-01 (×3): qty 1

## 2022-09-01 NOTE — Group Note (Signed)
Date:  09/01/2022 Time:  9:46 PM  Group Topic/Focus:  Wrap-Up Group:   The focus of this group is to help patients review their daily goal of treatment and discuss progress on daily workbooks.    Participation Level:  Active  Participation Quality:  Appropriate and Sharing  Affect:  Appropriate  Cognitive:  Appropriate  Insight: Appropriate  Engagement in Group:  Engaged  Modes of Intervention:  Discussion and Support  Additional Comments:  The patient stated that there was no set goal for today. The patient did state that she wants to engage and interact more with others. The patient stated that something positive was that she finished a book and stared a new book. The patient rated their day 3/4 out of 10. The patient stated that is was kind of a rough day; anxious but it's getting better.  Kennieth Francois 09/01/2022, 9:46 PM

## 2022-09-01 NOTE — Progress Notes (Signed)
   09/01/22 2045  Psych Admission Type (Psych Patients Only)  Admission Status Voluntary  Psychosocial Assessment  Patient Complaints Anxiety;Depression;Other (Comment) (depersonalization)  Eye Contact Fair  Facial Expression Anxious;Pensive  Affect Appropriate to circumstance;Anxious  Speech Soft;Logical/coherent  Interaction Assertive  Motor Activity Other (Comment) (wnl)  Appearance/Hygiene Unremarkable  Behavior Characteristics Cooperative;Anxious  Mood Anxious;Depressed;Pleasant  Thought Process  Coherency WDL  Content WDL  Delusions None reported or observed  Perception WDL;Depersonalization (pt states that she has moments of depersonalization)  Hallucination None reported or observed  Judgment WDL  Confusion None  Danger to Self  Current suicidal ideation? Denies  Agreement Not to Harm Self Yes  Description of Agreement verbal  Danger to Others  Danger to Others None reported or observed   Progress note   D: Pt seen in dayroom. Pt denies SI, HI, AVH. Pt rates pain  0/10. Pt rates anxiety  4/10 and depression  7/10. Pt states that she didn't have a pleasant day today. Wasn't feeling well and did not sleep well last night. "I took that Zoloft today and was so jittery that I couldn't stand it. I had to get some medication for anxiety." Pt denied having an appetite today. Said she had some other symptoms that she wanted to share about her current  condition. "I have been feeling like I'm disconnected from myself, if that makes sense. It's hard to focus and stay present. I'll be sitting and watching television and I zone out sometimes as well." Pt states this has gone on for years, at times, but has been worse since her last hospitalization in July for psychosis. Feels that she is still not at her baseline.   Pt had questions about her medications and all were answered. Pt noted that one good thing about her day was that she spoke with her mother. "That lady loves me and she is  very encouraging." Pt also stated that being roomed with another pt on a 1:1 observation is not helping her sleep quality. "I wanted to request a room change. It's hard to sleep when there is another person sitting in the corner watching." No other concerns noted at this time.  A: Pt provided support and encouragement. Pt given scheduled medication as prescribed. PRNs as appropriate. Q15 min checks for safety.   R: Pt safe on the unit. Will continue to monitor.

## 2022-09-01 NOTE — Progress Notes (Signed)
   09/01/22 0556  15 Minute Checks  Location Bedroom  Visual Appearance Calm  Behavior Sleeping  Sleep (Behavioral Health Patients Only)  Calculate sleep? (Click Yes once per 24 hr at 0600 safety check) Yes  Documented sleep last 24 hours 7.5

## 2022-09-01 NOTE — Group Note (Signed)
Recreation Therapy Group Note   Group Topic:Problem Solving  Group Date: 09/01/2022 Start Time: 0931 End Time: 1001 Facilitators: Myla Mauriello-McCall, LRT,CTRS Location: [address] Dayroom   Goal Area(s) Addresses:  Patient will effectively work with peer towards shared goal.  Patient will identify skills used to make activity successful.  Patient will identify how skills used during activity can be applied to reach post d/c goals.   Group Description: [employer]. In teams of 5-6, patients were given 11 craft pipe cleaners. Using the materials provided, patients were instructed to compete again the opposing team(s) to build the tallest free-standing structure from floor level. The activity was timed; difficulty increased by Clinical research associate as Production designer, theatre/television/film continued.  Systematically resources were removed with additional directions for example, placing one arm behind their back, working in silence, and shape stipulations. LRT facilitated post-activity discussion reviewing team processes and necessary communication skills involved in completion. Patients were encouraged to reflect how the skills utilized, or not utilized, in this activity can be incorporated to positively impact support systems post discharge.   Affect/Mood: N/A   Participation Level: Did not attend    Clinical Observations/Individualized Feedback:     Plan: Continue to engage patient in RT group sessions 2-3x/week.   Nillie Bartolotta-McCall, LRT,CTRS 09/01/2022 12:29 PM

## 2022-09-01 NOTE — Plan of Care (Signed)
  Problem: Education: Goal: Emotional status will improve Outcome: Progressing Goal: Mental status will improve Outcome: Progressing Goal: Verbalization of understanding the information provided will improve Outcome: Progressing   Problem: Activity: Goal: Interest or engagement in activities will improve Outcome: Progressing Goal: Sleeping patterns will improve Outcome: Progressing

## 2022-09-01 NOTE — BH IP Treatment Plan (Signed)
Interdisciplinary Treatment and Diagnostic Plan Update  09/01/2022 Time of Session: 10:30 AM  Jennifer York MRN: [id number]  Principal Diagnosis: MDD (major depressive disorder), recurrent severe, without psychosis (HCC)  Secondary Diagnoses: Principal Problem:   MDD (major depressive disorder), recurrent severe, without psychosis (HCC)   Current Medications:  Current Facility-Administered Medications  Medication Dose Route Frequency Provider Last Rate Last Admin   acetaminophen (TYLENOL) tablet 650 mg  650 mg Oral Q6H PRN Sunday Corn, NP       alum & mag hydroxide-simeth (MAALOX/MYLANTA) 200-200-20 MG/5ML suspension 30 mL  30 mL Oral Q4H PRN Sunday Corn, NP       diphenhydrAMINE (BENADRYL) capsule 50 mg  50 mg Oral TID PRN Sunday Corn, NP       Or   diphenhydrAMINE (BENADRYL) injection 50 mg  50 mg Intramuscular TID PRN Sunday Corn, NP       haloperidol (HALDOL) tablet 5 mg  5 mg Oral TID PRN Sunday Corn, NP       Or   haloperidol lactate (HALDOL) injection 5 mg  5 mg Intramuscular TID PRN Sunday Corn, NP       hydrOXYzine (ATARAX) tablet 50 mg  50 mg Oral TID PRN Carrion-Carrero, Karle Starch, MD       lamoTRIgine (LAMICTAL) tablet 25 mg  25 mg Oral Daily Carrion-Carrero, Margely, MD   25 mg at 09/01/22 0750   lamoTRIgine (LAMICTAL) tablet 50 mg  50 mg Oral QHS Carrion-Carrero, Margely, MD   50 mg at 08/31/22 2119   LORazepam (ATIVAN) tablet 2 mg  2 mg Oral TID PRN Sunday Corn, NP       Or   LORazepam (ATIVAN) injection 2 mg  2 mg Intramuscular TID PRN Sunday Corn, NP       magnesium hydroxide (MILK OF MAGNESIA) suspension 30 mL  30 mL Oral Daily PRN Sunday Corn, NP       nicotine (NICODERM CQ - dosed in mg/24 hours) patch 21 mg  21 mg Transdermal Daily Massengill, Harrold Donath, MD   21 mg at 09/01/22 1324   sertraline (ZOLOFT) tablet 50 mg  50 mg Oral Daily Carrion-Carrero, Karle Starch, MD   50 mg at 09/01/22 0750   traZODone (DESYREL) tablet 50  mg  50 mg Oral QHS Carrion-Carrero, Margely, MD       PTA Medications: Medications Prior to Admission  Medication Sig Dispense Refill Last Dose   chlorhexidine (PERIDEX) 0.12 % solution Use as directed 15 mLs in the mouth or throat 2 (two) times daily. Rinse for 30 seconds after brushing, then spit.      hydrOXYzine (ATARAX) 25 MG tablet Take 1 tablet (25 mg total) by mouth 3 (three) times daily as needed for anxiety. 30 tablet 0    ibuprofen (ADVIL) 800 MG tablet Take 800 mg by mouth every 6 (six) hours as needed (For pain).      lamoTRIgine (LAMICTAL) 25 MG tablet Take 1 tablet (25 mg total) by mouth every 12 (twelve) hours. 60 tablet 0    levothyroxine (SYNTHROID) 100 MCG tablet Take 100 mcg by mouth daily before breakfast. (Patient not taking: Reported on 08/30/2022)      oxyCODONE-acetaminophen (PERCOCET/ROXICET) 5-325 MG tablet Take 1 tablet by mouth every 6 (six) hours as needed for severe pain. 6 tablet 0    penicillin v potassium (VEETID) 500 MG tablet Take 500 mg by mouth 4 (four) times daily. Take for 14 days starting on 08/21/22.  risperiDONE (RISPERDAL) 2 MG tablet Take 2 mg by mouth at bedtime.       Patient Stressors: Financial difficulties    Patient Strengths: Active sense of humor  Average or above average intelligence  Communication skills  General fund of knowledge  Motivation for treatment/growth  Physical Health  Supportive family/friends   Treatment Modalities: Medication Management, Group therapy, Case management,  1 to 1 session with clinician, Psychoeducation, Recreational therapy.   Physician Treatment Plan for Primary Diagnosis: MDD (major depressive disorder), recurrent severe, without psychosis (HCC) Long Term Goal(s): Improvement in symptoms so as ready for discharge   Short Term Goals: Ability to identify changes in lifestyle to reduce recurrence of condition will improve Ability to verbalize feelings will improve  Medication Management: Evaluate  patient's response, side effects, and tolerance of medication regimen.  Therapeutic Interventions: 1 to 1 sessions, Unit Group sessions and Medication administration.  Evaluation of Outcomes: Not Progressing  Physician Treatment Plan for Secondary Diagnosis: Principal Problem:   MDD (major depressive disorder), recurrent severe, without psychosis (HCC)  Long Term Goal(s): Improvement in symptoms so as ready for discharge   Short Term Goals: Ability to identify changes in lifestyle to reduce recurrence of condition will improve Ability to verbalize feelings will improve     Medication Management: Evaluate patient's response, side effects, and tolerance of medication regimen.  Therapeutic Interventions: 1 to 1 sessions, Unit Group sessions and Medication administration.  Evaluation of Outcomes: Not Progressing   RN Treatment Plan for Primary Diagnosis: MDD (major depressive disorder), recurrent severe, without psychosis (HCC) Long Term Goal(s): Knowledge of disease and therapeutic regimen to maintain health will improve  Short Term Goals: Ability to remain free from injury will improve, Ability to verbalize frustration and anger appropriately will improve, Ability to demonstrate self-control, Ability to participate in decision making will improve, Ability to verbalize feelings will improve, Ability to disclose and discuss suicidal ideas, Ability to identify and develop effective coping behaviors will improve, and Compliance with prescribed medications will improve  Medication Management: RN will administer medications as ordered by provider, will assess and evaluate patient's response and provide education to patient for prescribed medication. RN will report any adverse and/or side effects to prescribing provider.  Therapeutic Interventions: 1 on 1 counseling sessions, Psychoeducation, Medication administration, Evaluate responses to treatment, Monitor vital signs and CBGs as ordered,  Perform/monitor CIWA, COWS, AIMS and Fall Risk screenings as ordered, Perform wound care treatments as ordered.  Evaluation of Outcomes: Not Progressing   LCSW Treatment Plan for Primary Diagnosis: MDD (major depressive disorder), recurrent severe, without psychosis (HCC) Long Term Goal(s): Safe transition to appropriate next level of care at discharge, Engage patient in therapeutic group addressing interpersonal concerns.  Short Term Goals: Engage patient in aftercare planning with referrals and resources, Increase social support, Increase ability to appropriately verbalize feelings, Increase emotional regulation, Facilitate acceptance of mental health diagnosis and concerns, Facilitate patient progression through stages of change regarding substance use diagnoses and concerns, Identify triggers associated with mental health/substance abuse issues, and Increase skills for wellness and recovery  Therapeutic Interventions: Assess for all discharge needs, 1 to 1 time with Social worker, Explore available resources and support systems, Assess for adequacy in community support network, Educate family and significant other(s) on suicide prevention, Complete Psychosocial Assessment, Interpersonal group therapy.  Evaluation of Outcomes: Not Progressing   Progress in Treatment: Attending groups: Yes. Participating in groups: Yes. Taking medication as prescribed: Yes. Toleration medication: Yes. Family/Significant other contact made: No, will contact:  [name] partner-[phone] Patient understands diagnosis: Yes. Discussing patient identified problems/goals with staff: Yes. Medical problems stabilized or resolved: Yes. Denies suicidal/homicidal ideation: Yes. Issues/concerns per patient self-inventory: No.   New problem(s) identified: No, Describe:  None reported   New Short Term/Long Term Goal(s): medication stabilization, elimination of SI thoughts, development of comprehensive  mental wellness plan.    Patient Goals:  " get back to my baseline of not feeling depressed   Discharge Plan or Barriers: Patient recently admitted. CSW will continue to follow and assess for appropriate referrals and possible discharge planning.    Reason for Continuation of Hospitalization: Anxiety Depression Medication stabilization Suicidal ideation  Estimated Length of Stay: 3-5 days  Last 3 [place] Suicide Severity Risk Score: Flowsheet Row [facility] (Current[facility] Most recent reading at 08/30/2022  6:00 PM ED from 08/30/2022 in [facility] Most recent reading at 08/30/2022  3:29 PM ED from 07/22/2022 in [facility] Emergency Department at [facility] Most recent reading at 07/22/2022  2:09 PM  C-SSRS RISK CATEGORY Low Risk Low Risk No Risk       Last PHQ 2/9 Scores:     No data to display          Scribe for Treatment Team: Beather Arbour 09/01/2022 1:38 PM

## 2022-09-01 NOTE — Progress Notes (Signed)
[facility] MD Progress Note  09/01/2022 12:02 PM Jennifer York  MRN:  [id number]  Principal Problem: MDD (major depressive disorder), recurrent severe, without psychosis (HCC) Diagnosis: Principal Problem:   MDD (major depressive disorder), recurrent severe, without psychosis (HCC)   Reason for Admission:  Jennifer York is a 32 year old female with a past psychiatric history of MDD, GAD, recent Schizoaffective disorder (bipolar type) diagnosis, Cannabis-induced psychosis, Polysubstance use disorder (tobacco, alcohol, LSD, mushroom) and 2 prior psychiatric hospitalizations (most recent June 2024) who presented to [facility] voluntarily on 08/30/2022 with complaints of active suicide ideation with means to carry out plan. Her UDS was positive for THC on admission. (admitted on 08/30/2022, total  LOS: 2 days )  Yesterday, the psychiatry team made following recommendations:  Discontinue Risperdal Order repeat prolactin for today, may repeat by discharge Increase Lamictal 25 mg twice daily to Lamictal 25 mg in the morning + Lamictal 50 mg nightly, for mood stabilization Start Zoloft 50 mg daily, per patient's request for management of her depression Continue to assess for symptoms of mania while on SSRI Start trazodone 50 mg nightly as needed for insomnia  Information Obtained Today During Patient Interview:  Patient evaluated at bedside. Patient reports her sleep was ok and her appetite is good. Patient is happy her gluten free diet has been accommodated. Patient describes her mood today as "too much energy" and feeling "pretty positive". Patient reports she had a moment last night when she felt peaceful but since then has been feeling increasing jitteriness and a physical sensation she can't stop moving. Patient reports she is still experiencing depressive symptoms but less than yesterday. Patient describes she is craving nicotine and missing the act of using her vape pen along with the sense of  pleasure, which she particularly haven't felt in a while. Patient denies suicidal ideation and self harm behaviors like restricting her food intake. Patient denies HI, AVH, paranoia or delusions.   Patient gave [place] permission to speak to her boyfriend, [name] [phone], for collateral today 09/01/2022: [name] reports that he has been dating the patient for 5 years. He shared that he spoke to the patient last night and she sounded calm and less depressed than earlier in her admission.  Confirms patient's psychotic episode from February to June, confirms patient had increased the frequency of her cannabis use a month prior to the onset of her symptoms.  During this period of psychosis, he reports the patient had increased energy, decreased need for sleep and diminished appetite.  He described her delusional content as fantastical thinking, also alludes to some grandiosity as patient attempted to teach/correct patient on mathematical topics (for context boyfriend is an Art gallery manager, patient has no background in this field close).  Since patient's last discharge from [facility] in June, he has not noted significant psychosis, but notes the patient has admitted to feeling like she has to "re-do her thoughts every morning".   [name] is able to recall that prior to patient's episode of psychosis this year, she has exhibited periods of expansive energy and mood, with goal-directed activity, that last for a maximum of 4 days without significant disruption in her social and occupational functioning.  Pertinent information discussed during bed progression: No acute concerns overnight.  Past Psychiatric Hx: Current Psychiatrist: [facility], started a month ago, has had 2 visits, patient is unsure if she would like to continue working with them. Current Therapist: [facility] in [place] Previous Psych Diagnoses: MDD, GAD, Schizoaffective disorder (bipolar  type), Tic disorder Current psychiatric medications: Lamictal,  risperidone Psychiatric medication history: Wellbutrin, Zoloft, Abilify Prior inpatient treatment: 2 (2017, 06/2022) Current/prior outpatient treatment: risperidone 1mg  daily, lamotrigine 25 mg BID Prior rehab hx: none on chart review Psychotherapy hx: Futures trader with [facility] History of suicide: 2 (2017, 08/2022) History of homicide or aggression: denies Psychiatric medication compliance history: compliant per patient Neuromodulation history: none on chart review   Substance Abuse Hx: Alcohol: started at age 60, drinking daily since age 76 (2 drinks, liquor). "It is hard to go a day without drinking". Denies withdrawal symptoms, including seizures. Tobacco: started at age 76, uses vape constantly Illicit drugs:  Rx drug abuse: denies Rehab hx: not assessed   Past Medical History: PCP: [facility] Dx: Hashimoto's Meds:Levothyroxine (not taking 08/2022) ALL: denies Hosp: denies Surgeries: denies Trauma: not assessed Seizures: denies   LMP: June 2024 Contraceptives: none, has scheduled appointment for Paraguard insertion next week (Aug. 2024)  Past Medical History:  Diagnosis Date   Family history of adverse reaction to anesthesia    father has woken up during surgery   Medical history non-contributory     Family History: Medical: father has a heart condition Psych: anxiety, depression Psych Rx: none on chart review SA/HA: father used to use cannabis Substance use family hx: Family History  Problem Relation Age of Onset   Depression Mother    Depression Father    Depression Brother    Social History: Living: in apartment with boyfriend, Thadius. Family (mother, father. Brother) lives 30 min away. 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 Work: Unemployed Finances: Currently depends on boyfriend Marital Status: Single Children: Denies   Abuse: Denies Armed forces operational officer: Denies Hotel manager:  Denies  Current Medications: Current Facility-Administered Medications  Medication Dose Route Frequency Provider Last Rate Last Admin   acetaminophen (TYLENOL) tablet 650 mg  650 mg Oral Q6H PRN Sunday Corn, NP       alum & mag hydroxide-simeth (MAALOX/MYLANTA) 200-200-20 MG/5ML suspension 30 mL  30 mL Oral Q4H PRN Sunday Corn, NP       diphenhydrAMINE (BENADRYL) capsule 50 mg  50 mg Oral TID PRN Sunday Corn, NP       Or   diphenhydrAMINE (BENADRYL) injection 50 mg  50 mg Intramuscular TID PRN Sunday Corn, NP       haloperidol (HALDOL) tablet 5 mg  5 mg Oral TID PRN Sunday Corn, NP       Or   haloperidol lactate (HALDOL) injection 5 mg  5 mg Intramuscular TID PRN Sunday Corn, NP       hydrOXYzine (ATARAX) tablet 25 mg  25 mg Oral TID PRN Sunday Corn, NP   25 mg at 09/01/22 1114   lamoTRIgine (LAMICTAL) tablet 25 mg  25 mg Oral Daily Carrion-Carrero, Claudie Rathbone, MD   25 mg at 09/01/22 0750   lamoTRIgine (LAMICTAL) tablet 50 mg  50 mg Oral QHS Carrion-Carrero, Araiya Tilmon, MD   50 mg at 08/31/22 2119   LORazepam (ATIVAN) tablet 2 mg  2 mg Oral TID PRN Sunday Corn, NP       Or   LORazepam (ATIVAN) injection 2 mg  2 mg Intramuscular TID PRN Sunday Corn, NP       magnesium hydroxide (MILK OF MAGNESIA) suspension 30 mL  30 mL Oral Daily PRN Sunday Corn, NP       nicotine (NICODERM CQ - dosed in  mg/24 hours) patch 21 mg  21 mg Transdermal Daily Massengill, Harrold Donath, MD   21 mg at 09/01/22 0752   sertraline (ZOLOFT) tablet 50 mg  50 mg Oral Daily Carrion-Carrero, Karle Starch, MD   50 mg at 09/01/22 0750   traZODone (DESYREL) tablet 50 mg  50 mg Oral QHS Carrion-Carrero, Karle Starch, MD        Lab Results:  Results for orders placed or performed during the hospital encounter of 08/30/22 (from the past 48 hour(s))  CBC with Differential/Platelet     Status: None   Collection Time: 08/30/22  1:15 PM  Result Value Ref Range   WBC 9.0 4.0 - 10.5 K/uL   RBC  4.35 3.87 - 5.11 MIL/uL   Hemoglobin 14.0 12.0 - 15.0 g/dL   HCT 40.9 81.1 - 91.4 %   MCV 92.4 80.0 - 100.0 fL   MCH 32.2 26.0 - 34.0 pg   MCHC 34.8 30.0 - 36.0 g/dL   RDW 78.2 95.6 - 21.3 %   Platelets 235 150 - 400 K/uL   nRBC 0.0 0.0 - 0.2 %   Neutrophils Relative % 79 %   Neutro Abs 7.0 1.7 - 7.7 K/uL   Lymphocytes Relative 17 %   Lymphs Abs 1.6 0.7 - 4.0 K/uL   Monocytes Relative 4 %   Monocytes Absolute 0.4 0.1 - 1.0 K/uL   Eosinophils Relative 0 %   Eosinophils Absolute 0.0 0.0 - 0.5 K/uL   Basophils Relative 0 %   Basophils Absolute 0.0 0.0 - 0.1 K/uL   Immature Granulocytes 0 %   Abs Immature Granulocytes 0.03 0.00 - 0.07 K/uL    Comment: Performed at [facility], [address], [place], [place] [address]  Comprehensive metabolic panel     Status: Abnormal   Collection Time: 08/30/22  1:15 PM  Result Value Ref Range   Sodium 138 135 - 145 mmol/L   Potassium 3.7 3.5 - 5.1 mmol/L   Chloride 102 98 - 111 mmol/L   CO2 28 22 - 32 mmol/L   Glucose, Bld 133 (H) 70 - 99 mg/dL    Comment: Glucose reference range applies only to samples taken after fasting for at least 8 hours.   BUN 8 6 - 20 mg/dL   Creatinine, Ser 8.46 0.44 - 1.00 mg/dL   Calcium 9.6 8.9 - 96.2 mg/dL   Total Protein 7.1 6.5 - 8.1 g/dL   Albumin 4.5 3.5 - 5.0 g/dL   AST 19 15 - 41 U/L   ALT 15 0 - 44 U/L   Alkaline Phosphatase 56 38 - 126 U/L   Total Bilirubin 1.1 0.3 - 1.2 mg/dL   GFR, Estimated >95 >28 mL/min    Comment: (NOTE) Calculated using the CKD-EPI Creatinine Equation (2021)    Anion gap 8 5 - 15    Comment: Performed at [facility], [address] [place], [place] [address]  Hemoglobin A1c     Status: None   Collection Time: 08/30/22  1:15 PM  Result Value Ref Range   Hgb A1c MFr Bld 5.3 4.8 - 5.6 %    Comment: (NOTE) Pre diabetes:          5.7%-6.4%  Diabetes:              >6.4%  Glycemic control for   <7.0% adults with diabetes    Mean Plasma Glucose 105.41 mg/dL     Comment: Performed at [facility], [address], [place], [place] [address]  Magnesium     Status: None   Collection Time: 08/30/22  1:15 PM  Result Value Ref Range   Magnesium 2.0 1.7 - 2.4 mg/dL    Comment: Performed at [facility], [address] [place], [place] [address]  Ethanol     Status: None   Collection Time: 08/30/22  1:15 PM  Result Value Ref Range   Alcohol, Ethyl (B) <10 <10 mg/dL    Comment: (NOTE) Lowest detectable limit for serum alcohol is 10 mg/dL.  For medical purposes only. Performed at [facility], [address] [place], [place] [address]   Lipid panel     Status: Abnormal   Collection Time: 08/30/22  1:15 PM  Result Value Ref Range   Cholesterol 205 (H) 0 - 200 mg/dL   Triglycerides 43 <914 mg/dL   HDL 86 >78 mg/dL   Total CHOL/HDL Ratio 2.4 RATIO   VLDL 9 0 - 40 mg/dL   LDL Cholesterol 295 (H) 0 - 99 mg/dL    Comment:        Total Cholesterol/HDL:CHD Risk Coronary Heart Disease Risk Table                     Men   Women  1/2 Average Risk   3.4   3.3  Average Risk       5.0   4.4  2 X Average Risk   9.6   7.1  3 X Average Risk  23.4   11.0        Use the calculated Patient Ratio above and the CHD Risk Table to determine the patient's CHD Risk.        ATP III CLASSIFICATION (LDL):  <100     mg/dL   Optimal  621-308  mg/dL   Near or Above                    Optimal  130-159  mg/dL   Borderline  657-846  mg/dL   High  >962     mg/dL   Very High Performed at [facility], [address], [place], [place] [address]   Prolactin     Status: Abnormal   Collection Time: 08/30/22  1:15 PM  Result Value Ref Range   Prolactin 34.9 (H) 4.8 - 33.4 ng/mL    Comment: (NOTE) Performed At: [facility] [address] [place], [place] [id number] Jolene Schimke MD VO:[id number]   TSH     Status: None   Collection Time: 08/30/22  1:15 PM  Result Value Ref Range   TSH 1.378 0.350 - 4.500 uIU/mL    Comment:  Performed by a 3rd Generation assay with a functional sensitivity of <=0.01 uIU/mL. Performed at [facility], [address] [place], [place] [address]   POC urine preg, ED     Status: Normal   Collection Time: 08/30/22  1:26 PM  Result Value Ref Range   Preg Test, Ur Negative Negative  POCT Urine Drug Screen - (I-Screen)     Status: Normal   Collection Time: 08/30/22  1:26 PM  Result Value Ref Range   POC Amphetamine UR None Detected NONE DETECTED (Cut Off Level 1000 ng/mL)   POC Secobarbital (BAR) None Detected NONE DETECTED (Cut Off Level 300 ng/mL)   POC Buprenorphine (BUP) None Detected NONE DETECTED (Cut Off Level 10 ng/mL)   POC Oxazepam (BZO) None Detected NONE DETECTED (  Cut Off Level 300 ng/mL)   POC Cocaine UR None Detected NONE DETECTED (Cut Off Level 300 ng/mL)   POC Methamphetamine UR None Detected NONE DETECTED (Cut Off Level 1000 ng/mL)   POC Morphine None Detected NONE DETECTED (Cut Off Level 300 ng/mL)   POC Methadone UR None Detected NONE DETECTED (Cut Off Level 300 ng/mL)   POC Oxycodone UR None Detected NONE DETECTED (Cut Off Level 100 ng/mL)   POC Marijuana UR None Detected NONE DETECTED (Cut Off Level 50 ng/mL)  Pregnancy, urine POC     Status: None   Collection Time: 08/30/22  2:22 PM  Result Value Ref Range   Preg Test, Ur NEGATIVE NEGATIVE    Comment:        THE SENSITIVITY OF THIS METHODOLOGY IS >24 mIU/mL     Blood Alcohol level:  Lab Results  Component Value Date   ETH <10 08/30/2022   ETH <10 05/18/2022    Metabolic Labs: Lab Results  Component Value Date   HGBA1C 5.3 08/30/2022   MPG 105.41 08/30/2022   MPG 91.06 07/05/2022   Lab Results  Component Value Date   PROLACTIN 34.9 (H) 08/30/2022   PROLACTIN 17.3 05/18/2022   Lab Results  Component Value Date   CHOL 205 (H) 08/30/2022   TRIG 43 08/30/2022   HDL 86 08/30/2022   CHOLHDL 2.4 08/30/2022   VLDL 9 08/30/2022   LDLCALC 110 (H) 08/30/2022   LDLCALC 67 07/05/2022     Sleep:Sleep: Fair Number of Hours of Sleep: 7.5   Physical Findings: AIMS: not assesed  CIWA:    COWS:     Psychiatric Specialty Exam:  Presentation  General Appearance: Appropriate for Environment  Eye Contact:Good  Speech:Clear and Coherent; Normal Rate  Speech Volume:Normal  Handedness:-- (not assessed)   Mood and Affect  Mood:-- ("too much energy"")  Affect:Congruent   Thought Process  Thought Processes:Goal Directed  Descriptions of Associations:Intact  Orientation:Full (Time, Place and Person)  Thought Content:Logical  History of Schizophrenia/Schizoaffective disorder:Yes  Duration of Psychotic Symptoms:N/A  Hallucinations:Hallucinations: None  Ideas of Reference:None  Suicidal Thoughts:Suicidal Thoughts: No SI Passive Intent and/or Plan: Without Intent; Without Means to Carry Out  Homicidal Thoughts:Homicidal Thoughts: No   Sensorium  Memory:Immediate Good; Recent Fair; Remote Fair  Judgment:Fair  Insight:Fair   Executive Functions  Concentration:Fair  Attention Span:Fair  Recall:Fair  Fund of Knowledge:Fair  Language:Good   Psychomotor Activity  Psychomotor Activity:Psychomotor Activity: Normal   Assets  Assets:Communication Skills; Resilience; Social Support   Sleep  Sleep:Sleep: Fair Number of Hours of Sleep: 7.5    Physical Exam: Physical Exam Vitals and nursing note reviewed.  Constitutional:      General: She is not in acute distress.    Appearance: Normal appearance.  HENT:     Head: Normocephalic and atraumatic.  Pulmonary:     Effort: Pulmonary effort is normal.  Musculoskeletal:        General: Normal range of motion.  Neurological:     General: No focal deficit present.     Mental Status: She is alert.   Review of Systems  Gastrointestinal:  Negative for abdominal pain, diarrhea and nausea.  Psychiatric/Behavioral:  Negative for hallucinations, memory loss and suicidal ideas. The patient  is not nervous/anxious and does not have insomnia.    Blood pressure 111/72, pulse 66, temperature 98.6 F (37 C), temperature source Oral, resp. rate 20, height 5\' 7"  (1.702 m), weight 65.2 kg, SpO2 97%. Body mass index is 22.52 kg/m.  Treatment Plan Summary: Daily contact with patient to assess and evaluate symptoms and progress in treatment and Medication management   ASSESSMENT: Jennifer York is a 32 year old female with a past psychiatric history of MDD, GAD, recent Schizoaffective disorder (bipolar type) diagnosis, Cannabis-induced psychosis, Polysubstance use disorder (tobacco, alcohol, LSD, mushroom) and 2 prior psychiatric hospitalizations (most recent June 2024) who presented to [facility] voluntarily on 8/7 with complaints of active suicide ideation with means to carry out plan.    8/8: Patient's symptoms are consistent with a tentative diagnosis of bipolar 1 disorder, current episode depressed.  Patient is unable to detail or specify manic symptoms, but does appear to have periods of expansive energy and mood.  She also appears to meet criteria for the diagnosis of GAD, given patient's report of excessive anxiety and worry associated with somatic symptoms. Patient has a history of polysubstance use, but has mostly used cannabis.  Patient does mention a previous diagnosis of schizoaffective disorder, currently do not think this is the most accurate diagnosis given the onset of psychosis is congruent with the onset of cannabis use.  8/9: Patient appears less depressed compared to yesterday. Paper copy of Y-BOCS given to patient to complete today.  Based on collateral call with patient's boyfriend, it appears she may have had hypomanic episodes in her past.  She continues to report anxiety, and is likely experiencing cannabis withdrawal.  Will continue to increase her Lamictal, also added scheduled BuSpar.    Diagnoses / Active Problems: Bipolar II disorder, current episode  depressed Generalized anxiety disorder Cannabis use disorder Cannabis induced psychosis, cannabis induced mania R/o out eating disorder R/o OCD  PLAN: Safety and Monitoring:  -- Voluntary admission to inpatient psychiatric unit for safety, stabilization and treatment  -- Daily contact with patient to assess and evaluate symptoms and progress in treatment  -- Patient's case to be discussed in multi-disciplinary team meeting  -- Observation Level : q15 minute checks  -- Vital signs:  q12 hours  -- Precautions: suicide, elopement, and assault  2. Psychiatric Diagnoses and Treatment:  Increase Lamictal 25 daily +50 nightly --> 50 mg Q12Hrs tomorrow (8/10) Continue Zoloft 50 mg daily for depression, low threshold to discontinue if she begins to cycle into mania/hypomania Start BuSpar 5 mg 3 times daily today, then increase to 10 mg 3 times daily tomorrow Scheduled trazodone 50 mg nightly for insomnia Risperdal discontinued on admission due to galactorrhea and amenorrhea Prolactin on 08/31/2022: pending  May repeat prolactin level by discharge -- The risks/benefits/side-effects/alternatives to this medication were discussed in detail with the patient and time was given for questions. The patient consents to medication trial.              -- Metabolic profile and EKG monitoring obtained while on an atypical antipsychotic  BMI: 22.52 kg/m TSH: 1.378 on 8/7 Lipid Panel: Cholesterol 205 and LDL 110 on 8/7 HbgA1c: 5.3 on 8/7 QTc: 456 on 8/7:              -- Encouraged patient to participate in unit milieu and in scheduled group therapies   -- Short Term Goals: Ability to identify changes in lifestyle to reduce recurrence of condition will improve and Ability to verbalize feelings will improve  -- Long Term Goals: Improvement in symptoms so as ready for discharge Other PRNS Maalox/Mylanta, Tylenol, milk of magnesia, agitation protocol (Benadryl/Haldol/Ativan)    3. Medical Issues Being  Addressed:   Tobacco Use Disorder  -- Nicotine patch  21mg /24 hours ordered  -- Smoking cessation encouraged  #Hypothyroidism -- TSH WNL on 08/30/2022  4. Discharge Planning:   -- Social work and case management to assist with discharge planning and identification of hospital follow-up needs prior to discharge  -- Estimated LOS: 5-7 days  -- Discharge Concerns: Need to establish a safety plan; Medication compliance and effectiveness  -- Discharge Goals: Return home with outpatient referrals for mental health follow-up including medication management/psychotherapy   I certify that inpatient services furnished can reasonably be expected to improve the patient's condition.   This note was created using a voice recognition software as a result there may be grammatical errors inadvertently enclosed that do not reflect the nature of this encounter. Every attempt is made to correct such errors.   Cleotis Nipper MS3  I personally was present and performed or re-performed the history, physical exam and medical decision-making activities of this service and have verified that the service and findings are accurately documented in the student's note.   Dr. Liston Alba, MD PGY-2, Psychiatry Residency  8/9/202412:02 PM

## 2022-09-01 NOTE — Progress Notes (Signed)
D:  Patient's self inventory sheet, patient has poor sleep, no sleep medication.  Fair apapetite, low energy level, poor concentration.  Rated depression 7, hopeless and anxiety 8.  Denied withdrawals, then checked cravings.  SI, sometimes, contracts.  Denied physical problems.  Denied physical pain.  Goal is keep wits about her and not get lost in thoughts.   No discharge plans. A:  Medications administered per MD orders.  Emotional support and encouragement given patient. R:  Denied SI and HI while talking to nurse today, contracts for safety.  Denied A/V hallucinations.  Safety maintained with 15 minute checks.

## 2022-09-01 NOTE — Plan of Care (Signed)
Nurse discussed anxiety, depression and coping skills with patient.  

## 2022-09-02 DIAGNOSIS — F332 Major depressive disorder, recurrent severe without psychotic features: Secondary | ICD-10-CM | POA: Diagnosis not present

## 2022-09-02 MED ORDER — CARIPRAZINE HCL 1.5 MG PO CAPS
1.5000 mg | ORAL_CAPSULE | Freq: Every day | ORAL | Status: DC
  Administered 2022-09-03 – 2022-09-05 (×3): 1.5 mg via ORAL
  Filled 2022-09-02 (×5): qty 1

## 2022-09-02 NOTE — Progress Notes (Signed)
[facility] ([facility]) MD Progress Note  09/02/2022 9:00 AM Jennifer York  MRN:  [id number]  Principal Problem: MDD (major depressive disorder), recurrent severe, without psychosis (HCC) Diagnosis: Principal Problem:   MDD (major depressive disorder), recurrent severe, without psychosis (HCC)  Reason for Admission:  Jennifer Chittenden is a 32 year old female with a past psychiatric history of MDD, GAD, recent Schizoaffective disorder (bipolar type) diagnosis, Cannabis-induced psychosis, Polysubstance use disorder (tobacco, alcohol, LSD, mushroom) and 2 prior psychiatric hospitalizations (most recent June 2024) who presented to [facility] voluntarily on 08/30/2022 with complaints of active suicide ideation with means to carry out plan. Her UDS was positive for THC on admission. (admitted on 08/30/2022, total  LOS: 3 days )  Yesterday, the psychiatry team made following recommendations:  Increase Lamictal 25 daily +50 nightly --> 50 mg Q12Hrs tomorrow (8/10) Continue Zoloft 50 mg daily for depression, low threshold to discontinue if she begins to cycle into mania/hypomania Start BuSpar 5 mg 3 times daily today, then increase to 10 mg 3 times daily tomorrow Scheduled trazodone 50 mg nightly for insomnia Risperdal discontinued on admission due to galactorrhea and amenorrhea Prolactin on 08/31/2022: pending  Overnight Events:  VSS. PRN atarax 1443. Depression 7/10, anxiety 4/10, felt very jittery with the zoloft. Slept 9.25 hours. Had some SI yesterday, that subsided.   Information Obtained Today During Patient Interview:  Patient evaluated at bedside. Reports she slept well with trazodone. Around 9 hours of sleep. Not as much of an appetite. Mood is starting to "err towards the side of depression." Feel like ruminating on memories. Feel numb and no joy. Reports feeling depersonalization, has been experiencing for a few years. Denies feeling increased energy. Discussed discontinuing zoloft. Denies any physical  symptoms. Denies noticing any new rashes on her skin. Reports some "flashes of suicidal ideation." Contracts for safety. She is also trying to go without nicotine patch. Denies SI/HI/AVH.   Patient gave [place] permission to speak to her boyfriend, [name] [phone], for collateral 09/01/2022: [name] reports that he has been dating the patient for 5 years. He shared that he spoke to the patient last night and she sounded calm and less depressed than earlier in her admission.  Confirms patient's psychotic episode from February to June, confirms patient had increased the frequency of her cannabis use a month prior to the onset of her symptoms.  During this period of psychosis, he reports the patient had increased energy, decreased need for sleep and diminished appetite.  He described her delusional content as fantastical thinking, also alludes to some grandiosity as patient attempted to teach/correct patient on mathematical topics (for context boyfriend is an Art gallery manager, patient has no background in this field close).  Since patient's last discharge from [facility] in June, he has not noted significant psychosis, but notes the patient has admitted to feeling like she has to "re-do her thoughts every morning".   [name] is able to recall that prior to patient's episode of psychosis this year, she has exhibited periods of expansive energy and mood, with goal-directed activity, that last for a maximum of 4 days without significant disruption in her social and occupational functioning.  Pertinent information discussed during bed progression: No acute concerns overnight.  Past Psychiatric Hx: Current Psychiatrist: [facility], started a month ago, has had 2 visits, patient is unsure if she would like to continue working with them. Current Therapist: [facility] in [place] Previous Psych Diagnoses: MDD, GAD, Schizoaffective disorder (bipolar type), Tic disorder Current psychiatric medications: Lamictal,  risperidone Psychiatric medication history: Wellbutrin, Zoloft, Abilify Prior inpatient treatment: 2 (2017, 06/2022) Current/prior outpatient treatment: risperidone 1mg  daily, lamotrigine 25 mg BID Prior rehab hx: none on chart review Psychotherapy hx: Futures trader with [facility] History of suicide: 2 (2017, 08/2022) History of homicide or aggression: denies Psychiatric medication compliance history: compliant per patient Neuromodulation history: none on chart review   Substance Abuse Hx: Alcohol: started at age 3, drinking daily since age 78 (2 drinks, liquor). "It is hard to go a day without drinking". Denies withdrawal symptoms, including seizures. Tobacco: started at age 45, uses vape constantly Illicit drugs:  Rx drug abuse: denies Rehab hx: not assessed   Past Medical History: PCP: [facility] Dx: Hashimoto's Meds:Levothyroxine (not taking 08/2022) ALL: denies Hosp: denies Surgeries: denies Trauma: not assessed Seizures: denies   LMP: June 2024 Contraceptives: none, has scheduled appointment for Paraguard insertion next week (Aug. 2024)  Past Medical History:  Diagnosis Date   Family history of adverse reaction to anesthesia    father has woken up during surgery   Medical history non-contributory     Family History: Medical: father has a heart condition Psych: anxiety, depression Psych Rx: none on chart review SA/HA: father used to use cannabis Substance use family hx: Family History  Problem Relation Age of Onset   Depression Mother    Depression Father    Depression Brother    Social History: Living: in apartment with boyfriend, Thadius. Family (mother, father. Brother) lives 30 min away. 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 Work: Unemployed Finances: Currently depends on boyfriend Marital Status: Single Children: Denies   Abuse: Denies Armed forces operational officer: Denies Hotel manager:  Denies  Current Medications: Current Facility-Administered Medications  Medication Dose Route Frequency Provider Last Rate Last Admin   acetaminophen (TYLENOL) tablet 650 mg  650 mg Oral Q6H PRN Sunday Corn, NP       alum & mag hydroxide-simeth (MAALOX/MYLANTA) 200-200-20 MG/5ML suspension 30 mL  30 mL Oral Q4H PRN Sunday Corn, NP       busPIRone (BUSPAR) tablet 5 mg  5 mg Oral TID Lorri Frederick, MD   5 mg at 09/02/22 0802   Followed by   busPIRone (BUSPAR) tablet 10 mg  10 mg Oral TID Carrion-Carrero, Margely, MD       diphenhydrAMINE (BENADRYL) capsule 50 mg  50 mg Oral TID PRN Sunday Corn, NP       Or   diphenhydrAMINE (BENADRYL) injection 50 mg  50 mg Intramuscular TID PRN Sunday Corn, NP       haloperidol (HALDOL) tablet 5 mg  5 mg Oral TID PRN Sunday Corn, NP       Or   haloperidol lactate (HALDOL) injection 5 mg  5 mg Intramuscular TID PRN Sunday Corn, NP       hydrOXYzine (ATARAX) tablet 50 mg  50 mg Oral TID PRN Carrion-Carrero, Karle Starch, MD   50 mg at 09/01/22 1443   lamoTRIgine (LAMICTAL) tablet 50 mg  50 mg Oral BID Carrion-Carrero, Karle Starch, MD   50 mg at 09/02/22 0802   LORazepam (ATIVAN) tablet 2 mg  2 mg Oral TID PRN Sunday Corn, NP       Or   LORazepam (ATIVAN) injection 2 mg  2 mg Intramuscular TID PRN Sunday Corn, NP       magnesium hydroxide (MILK OF MAGNESIA) suspension 30 mL  30 mL Oral Daily PRN Erskine Emery  E, NP       nicotine (NICODERM CQ - dosed in mg/24 hours) patch 21 mg  21 mg Transdermal Daily Massengill, Nathan, MD   21 mg at 09/01/22 0752   traZODone (DESYREL) tablet 50 mg  50 mg Oral QHS Carrion-Carrero, Margely, MD   50 mg at 09/01/22 2105    Lab Results:  No results found for this or any previous visit (from the past 48 hour(s)).   Blood Alcohol level:  Lab Results  Component Value Date   ETH <10 08/30/2022   ETH <10 05/18/2022    Metabolic Labs: Lab Results  Component Value Date    HGBA1C 5.3 08/30/2022   MPG 105.41 08/30/2022   MPG 91.06 07/05/2022   Lab Results  Component Value Date   PROLACTIN 34.9 (H) 08/30/2022   PROLACTIN 17.3 05/18/2022   Lab Results  Component Value Date   CHOL 205 (H) 08/30/2022   TRIG 43 08/30/2022   HDL 86 08/30/2022   CHOLHDL 2.4 08/30/2022   VLDL 9 08/30/2022   LDLCALC 110 (H) 08/30/2022   LDLCALC 67 07/05/2022    Sleep:Sleep: Fair Number of Hours of Sleep: 7.5  Physical Findings: AIMS: not assesed  Psychiatric Specialty Exam:  Presentation  General Appearance: Appropriate for Environment  Eye Contact:Good  Speech:Clear and Coherent; Normal Rate  Speech Volume:Normal  Handedness:-- (not assessed)   Mood and Affect  Mood: erring towards the side of depression  Affect:Congruent   Thought Process  Thought Processes:Goal Directed  Descriptions of Associations:Intact  Orientation:Full (Time, Place and Person)  Thought Content:Logical  History of Schizophrenia/Schizoaffective disorder:Yes  Duration of Psychotic Symptoms:N/A  Hallucinations:Hallucinations: None  Ideas of Reference:None  Suicidal Thoughts:Suicidal Thoughts: No  Homicidal Thoughts:Homicidal Thoughts: No   Sensorium  Memory:Immediate Good; Recent Fair; Remote Fair  Judgment:Fair  Insight:Fair   Executive Functions  Concentration:Fair  Attention Span:Fair  Recall:Fair  Fund of Knowledge:Fair  Language:Good   Psychomotor Activity  Psychomotor Activity:Psychomotor Activity: Normal   Assets  Assets:Communication Skills; Resilience; Social Support   Sleep  Sleep:Sleep: Fair Number of Hours of Sleep: 7.5    Physical Exam: Physical Exam Vitals and nursing note reviewed.  Constitutional:      General: She is not in acute distress.    Appearance: Normal appearance.  HENT:     Head: Normocephalic and atraumatic.  Pulmonary:     Effort: Pulmonary effort is normal.  Musculoskeletal:        General: Normal  range of motion.  Neurological:     General: No focal deficit present.     Mental Status: She is alert.    Review of Systems  Gastrointestinal:  Negative for abdominal pain, diarrhea and nausea.  Psychiatric/Behavioral:  Negative for hallucinations, memory loss and suicidal ideas. The patient is not nervous/anxious and does not have insomnia.    Blood pressure 111/72, pulse 66, temperature 98.6 F (37 C), temperature source Oral, resp. rate 20, height 5\' 7"  (1.702 m), weight 65.2 kg, SpO2 97%. Body mass index is 22.52 kg/m.  Treatment Plan Summary: Daily contact with patient to assess and evaluate symptoms and progress in treatment and Medication management   ASSESSMENT: Jennifer Lisbon is a 32 year old female with a past psychiatric history of MDD, GAD, recent Schizoaffective disorder (bipolar type) diagnosis, Cannabis-induced psychosis, Polysubstance use disorder (tobacco, alcohol, LSD, mushroom) and 2 prior psychiatric hospitalizations (most recent June 2024) who presented to [facility] voluntarily on 8/7 with complaints of active suicide ideation with means to carry  out plan.    8/8: Patient's symptoms are consistent with a tentative diagnosis of bipolar 1 disorder, current episode depressed.  Patient is unable to detail or specify manic symptoms, but does appear to have periods of expansive energy and mood.  She also appears to meet criteria for the diagnosis of GAD, given patient's report of excessive anxiety and worry associated with somatic symptoms. Patient has a history of polysubstance use, but has mostly used cannabis.  Patient does mention a previous diagnosis of schizoaffective disorder, currently do not think this is the most accurate diagnosis given the onset of psychosis is congruent with the onset of cannabis use.  8/9: Patient appears less depressed compared to yesterday. Paper copy of Y-BOCS given to patient to complete today.  Based on collateral call with  patient's boyfriend, it appears she may have had hypomanic episodes in her past.  She continues to report anxiety, and is likely experiencing cannabis withdrawal.  Will continue to increase her Lamictal, also added scheduled BuSpar.   8/10: Continues to report feeling depressed and depersonalization. Discussed that increased lamictal will hopefully help. Discussed decreased concentration and depersonalization may also be symptom of depression. Discussed discontinuing zoloft to decrease likelihood of going into manic episode. Plan to discuss restarting an additional mood stabilizing antipsychotic such as abilify or seroquel.  Diagnoses / Active Problems: Bipolar II disorder, current episode depressed Generalized anxiety disorder Cannabis use disorder Cannabis induced psychosis, cannabis induced mania R/o out eating disorder R/o OCD  PLAN: Safety and Monitoring:  -- Voluntary admission to inpatient psychiatric unit for safety, stabilization and treatment  -- Daily contact with patient to assess and evaluate symptoms and progress in treatment  -- Patient's case to be discussed in multi-disciplinary team meeting  -- Observation Level : q15 minute checks  -- Vital signs:  q12 hours  -- Precautions: suicide, elopement, and assault  2. Psychiatric Diagnoses and Treatment:  Continue increased Lamictal 50 mg Q12Hrs today STOP Zoloft 50 mg daily for depression, low threshold to discontinue if she begins to cycle into mania/hypomania Increase Buspar 10 mg 3 times daily today Scheduled trazodone 50 mg nightly for insomnia Risperdal discontinued on admission due to galactorrhea and amenorrhea Prolactin on 08/31/2022: pending  May repeat prolactin level by discharge -- The risks/benefits/side-effects/alternatives to this medication were discussed in detail with the patient and time was given for questions. The patient consents to medication trial.              -- Metabolic profile and EKG monitoring  obtained while on an atypical antipsychotic  BMI: 22.52 kg/m TSH: 1.378 on 8/7 Lipid Panel: Cholesterol 205 and LDL 110 on 8/7 HbgA1c: 5.3 on 8/7 QTc: 456 on 8/7:              -- Encouraged patient to participate in unit milieu and in scheduled group therapies   -- Short Term Goals: Ability to identify changes in lifestyle to reduce recurrence of condition will improve and Ability to verbalize feelings will improve  -- Long Term Goals: Improvement in symptoms so as ready for discharge Other PRNS Maalox/Mylanta, Tylenol, milk of magnesia, agitation protocol (Benadryl/Haldol/Ativan)   3. Medical Issues Being Addressed:   Tobacco Use Disorder  -- Nicotine patch 21mg /24 hours ordered  -- Smoking cessation encouraged  #Hypothyroidism -- TSH WNL on 08/30/2022  4. Discharge Planning:   -- Social work and case management to assist with discharge planning and identification of hospital follow-up needs prior to discharge  -- Estimated LOS:  5-7 days  -- Discharge Concerns: Need to establish a safety plan; Medication compliance and effectiveness  -- Discharge Goals: Return home with outpatient referrals for mental health follow-up including medication management/psychotherapy   I certify that inpatient services furnished can reasonably be expected to improve the patient's condition.   This note was created using a voice recognition software as a result there may be grammatical errors inadvertently enclosed that do not reflect the nature of this encounter. Every attempt is made to correct such errors.   Karie Fetch, MD PGY-2, Psychiatry Residency  8/10/20249:00 AM

## 2022-09-02 NOTE — Progress Notes (Addendum)
Pt didn't want to take Trazodone tonight, states it makes her too tired during day.  Pt did take prn Vistaril.  Pt is aware that she can still get Trazodone up until 2 AM.    09/02/22 2146  Psych Admission Type (Psych Patients Only)  Admission Status Voluntary  Psychosocial Assessment  Patient Complaints None  Eye Contact Fair  Facial Expression Flat  Affect Appropriate to circumstance  Speech Logical/coherent  Interaction Assertive  Motor Activity Other (Comment) (WDL)  Appearance/Hygiene Unremarkable  Behavior Characteristics Appropriate to situation  Mood Pleasant  Thought Process  Coherency WDL  Content WDL  Delusions None reported or observed  Perception WDL  Hallucination None reported or observed  Judgment WDL  Confusion None  Danger to Self  Current suicidal ideation? Denies  Self-Injurious Behavior No self-injurious ideation or behavior indicators observed or expressed   Agreement Not to Harm Self Yes  Description of Agreement verbal  Danger to Others  Danger to Others None reported or observed

## 2022-09-02 NOTE — Progress Notes (Signed)
   09/02/22 0559  15 Minute Checks  Location Bedroom  Visual Appearance Calm  Behavior Sleeping  Sleep (Behavioral Health Patients Only)  Calculate sleep? (Click Yes once per 24 hr at 0600 safety check) Yes  Documented sleep last 24 hours 9.25

## 2022-09-02 NOTE — Group Note (Signed)
Date:  09/02/2022 Time:  11:16 AM  Group Topic/Focus:  Goals Group:   The focus of this group is to help patients establish daily goals to achieve during treatment and discuss how the patient can incorporate goal setting into their daily lives to aide in recovery. Orientation:   The focus of this group is to educate the patient on the purpose and policies of crisis stabilization and provide a format to answer questions about their admission.  The group details unit policies and expectations of patients while admitted.    Participation Level:  Did Not Attend  Participation Quality:   n/a  Affect:   n/a  Cognitive:   n/a  Insight: None  Engagement in Group:   n/a  Modes of Intervention:   n/a  Additional Comments:   Pt did not attend.  Edmund Hilda Terianna Peggs 09/02/2022, 11:16 AM

## 2022-09-02 NOTE — Plan of Care (Signed)
?  Problem: Education: ?Goal: Emotional status will improve ?Outcome: Progressing ?Goal: Mental status will improve ?Outcome: Progressing ?Goal: Verbalization of understanding the information provided will improve ?Outcome: Progressing ?  ?Problem: Activity: ?Goal: Interest or engagement in activities will improve ?Outcome: Progressing ?Goal: Sleeping patterns will improve ?Outcome: Progressing ?  ?Problem: Coping: ?Goal: Ability to demonstrate self-control will improve ?Outcome: Progressing ?  ?Problem: Health Behavior/Discharge Planning: ?Goal: Compliance with treatment plan for underlying cause of condition will improve ?Outcome: Progressing ?  ?Problem: Physical Regulation: ?Goal: Ability to maintain clinical measurements within normal limits will improve ?Outcome: Progressing ?  ?Problem: Safety: ?Goal: Periods of time without injury will increase ?Outcome: Progressing ?  ?

## 2022-09-02 NOTE — [facility] Group Notes (Signed)
[facility] Group Notes:  (Nursing/MHT/Case Management/Adjunct)  Date:  09/02/2022  Time:  7:07 PM  Type of Therapy:  Nurse Education  Participation Level:  Did Not Attend  Participation Quality:   Not present.  Affect:   n/a  Cognitive:   n/a  Insight:  N/a   Engagement in Group:   na  Modes of Intervention:   Not present.  Summary of Progress/Problems: Group about Maslow's Hierarchy of Needs and how needs relate to mental health and stability. Healthy discussion about ways to improve self care and positive relationships.   Karren Burly 09/02/2022, 7:07 PM

## 2022-09-02 NOTE — Group Note (Signed)
Date:  09/02/2022 Time:  1:01 PM  Group Topic/Focus:  Dimensions of Wellness:   The focus of this group is to introduce the topic of wellness and discuss the role each dimension of wellness plays in total health.    Participation Level:  Did Not Attend  Participation Quality:   n/a  Affect:   n/a  Cognitive:   n/a  Insight: None  Engagement in Group:   n/a  Modes of Intervention:   n/a  Additional Comments:   Pt did not attend  Jennifer York 09/02/2022, 1:01 PM

## 2022-09-02 NOTE — [facility] Group Notes (Signed)
LCSW Wellness Group Note   09/02/2022 1:00pm  Type of Group and Topic: Psychoeducational Group:  Wellness  Participation Level:  did not attend  Description of Group  Wellness group introduces the topic and its focus on developing healthy habits across the spectrum and its relationship to a decrease in hospital admissions.  Six areas of wellness are discussed: physical, social spiritual, intellectual, occupational, and emotional.  Patients are asked to consider their current wellness habits and to identify areas of wellness where they are interested and able to focus on improvements.    Therapeutic Goals Patients will understand components of wellness and how they can positively impact overall health.  Patients will identify areas of wellness where they have developed good habits. Patients will identify areas of wellness where they would like to make improvements.    Summary of Patient Progress     Therapeutic Modalities: Cognitive Behavioral Therapy Psychoeducation    Lorri Frederick, LCSW

## 2022-09-02 NOTE — Group Note (Signed)
Date:  09/02/2022 Time:  9:27 PM  Group Topic/Focus:  Wrap-Up Group:   The focus of this group is to help patients review their daily goal of treatment and discuss progress on daily workbooks.    Participation Level:  Active  Participation Quality:  Appropriate and Sharing  Affect:  Appropriate  Cognitive:  Appropriate  Insight: Appropriate  Engagement in Group:  Engaged and Supportive  Modes of Intervention:  Discussion and Socialization  Additional Comments:  The patient stated that today was not a great day. The patient stated that she spent most of the day in the bed and had no motivation. The patient stated that she did not get much sleep last night and it could possible be due to one of the medications. The patient rated their day 3/10. The patient stated something positive today was that she got to interact and talk more with others; goal achieved. The patients goal for today was to interact more with others and that is the patients same goal for tomorrow.   Kennieth Francois 09/02/2022, 9:27 PM

## 2022-09-02 NOTE — Progress Notes (Signed)
   09/02/22 0802  Psych Admission Type (Psych Patients Only)  Admission Status Voluntary  Psychosocial Assessment  Patient Complaints None  Eye Contact Fair  Facial Expression Flat  Affect Flat  Speech Logical/coherent  Interaction Assertive  Motor Activity Other (Comment) (unremarkable)  Appearance/Hygiene Unremarkable  Behavior Characteristics Cooperative  Mood Pleasant  Thought Process  Coherency WDL  Content WDL  Delusions None reported or observed  Perception WDL  Hallucination None reported or observed  Judgment WDL  Confusion None  Danger to Self  Current suicidal ideation? Denies  Agreement Not to Harm Self Yes  Description of Agreement verbal  Danger to Others  Danger to Others None reported or observed

## 2022-09-03 DIAGNOSIS — F332 Major depressive disorder, recurrent severe without psychotic features: Secondary | ICD-10-CM | POA: Diagnosis not present

## 2022-09-03 MED ORDER — HYDROXYZINE HCL 25 MG PO TABS
25.0000 mg | ORAL_TABLET | Freq: Three times a day (TID) | ORAL | Status: DC | PRN
  Administered 2022-09-04: 25 mg via ORAL
  Filled 2022-09-03 (×2): qty 1

## 2022-09-03 NOTE — Progress Notes (Addendum)
Discussed changing Vistaril to 25 mg rather than 50 mg with Pt due to Pt feeling very tired as well as recent syncopal episode that was reported this morning.    09/03/22 2146  Psych Admission Type (Psych Patients Only)  Admission Status Voluntary  Psychosocial Assessment  Patient Complaints Anxiety  Eye Contact Fair  Facial Expression Flat  Affect Appropriate to circumstance  Speech Logical/coherent  Interaction Assertive  Motor Activity Other (Comment) (WDL)  Appearance/Hygiene Unremarkable  Behavior Characteristics Appropriate to situation  Mood Depressed;Pleasant  Thought Process  Coherency WDL  Content WDL  Delusions None reported or observed  Perception WDL  Hallucination None reported or observed  Judgment WDL  Confusion None  Danger to Self  Current suicidal ideation? Denies  Self-Injurious Behavior No self-injurious ideation or behavior indicators observed or expressed   Agreement Not to Harm Self Yes  Description of Agreement verbal  Danger to Others  Danger to Others None reported or observed

## 2022-09-03 NOTE — [facility] Group Notes (Signed)
[facility] Group Notes:  (Nursing/MHT/Case Management/Adjunct)  Date:  09/03/2022  Time:  8:52 PM  Type of Therapy:   Wrap-up group  Participation Level:  Active  Participation Quality:  Appropriate  Affect:  Appropriate  Cognitive:  Appropriate  Insight:  Appropriate  Engagement in Group:  Engaged  Modes of Intervention:  Education  Summary of Progress/Problems: Pt goal to get out of bed, shower. Pt reports she met goal. Rate of day 6/10.   Noah Delaine 09/03/2022, 8:52 PM

## 2022-09-03 NOTE — Progress Notes (Signed)
[facility] MD Progress Note  09/03/2022 6:55 AM Jennifer York  MRN:  [id number]  Principal Problem: MDD (major depressive disorder), recurrent severe, without psychosis (HCC) Diagnosis: Principal Problem:   MDD (major depressive disorder), recurrent severe, without psychosis (HCC)  Reason for Admission:  Jennifer York is a 32 year old female with a past psychiatric history of MDD, GAD, recent Schizoaffective disorder (bipolar type) diagnosis, Cannabis-induced psychosis, Polysubstance use disorder (tobacco, alcohol, LSD, mushroom) and 2 prior psychiatric hospitalizations (most recent June 2024) who presented to [facility] voluntarily on 08/30/2022 with complaints of active suicide ideation with means to carry out plan. Her UDS was positive for THC on admission. (admitted on 08/30/2022, total  LOS: 4 days )  Yesterday, the psychiatry team made following recommendations:  Continue increased Lamictal 50 mg Q12Hrs today STOP Zoloft 50 mg daily for depression Increase Buspar 10 mg 3 times daily today Scheduled trazodone 50 mg nightly for insomnia Risperdal discontinued on admission due to galactorrhea and amenorrhea Prolactin on 08/31/2022: pending - Start trial of vraylar 1.5mg  tomorrow   Overnight Events:  Episode of hypotension and lightheadedness this AM in the midst of taking meds. Elevated prolactin 43.7. PRN atarax 2101. Reported no motivation, spent most of the day in bed, felt trazodone made her too tired. Only took hydroxyzine for sleep. Rated day 3/10.   Pertinent information discussed during bed progression:  Had an episode of syncope while receiving medications this AM. Repeat vitals WNL. Took all of her morning medications. Denies SI.   Information Obtained Today During Patient Interview:  Felt like didn't have a good day yesterday. Low energy, not proactive about brushing teeth. Sleep was off and on. Slept around 7 hours. Continues to feel low energy, rates it a 2-3/10. Continues to  feel very drowsy. Feels like sleepy fog over me. Not noticing the buspar helping with anxiety. Discussed stopping the buspar. Still not have much of an appetite, feels like decreasing. Eating 2 small meals a day, just drink a smoothie. Haven't eaten breakfast this morning, eating feels like a lot of effort. Taking a shower feels like a lot of effort, being awake feels like a lot of effort. Had a decent meal last night, ate chicken with veggies, bowl of spinach dip, rice. Been only drinking 3 8 oz bottles a day for water. Encouraged fluid intake. Felt lightheaded, hot as she was taking medicine. Just feeling tired. Denies SI/HI/AVH. Still feeling very drowsy.   Patient gave [place] permission to speak to her boyfriend, [name] [phone], for collateral 09/01/2022: [name] reports that he has been dating the patient for 5 years. He shared that he spoke to the patient last night and she sounded calm and less depressed than earlier in her admission.  Confirms patient's psychotic episode from February to June, confirms patient had increased the frequency of her cannabis use a month prior to the onset of her symptoms.  During this period of psychosis, he reports the patient had increased energy, decreased need for sleep and diminished appetite.  He described her delusional content as fantastical thinking, also alludes to some grandiosity as patient attempted to teach/correct patient on mathematical topics (for context boyfriend is an Art gallery manager, patient has no background in this field close).  Since patient's last discharge from [facility] in June, he has not noted significant psychosis, but notes the patient has admitted to feeling like she has to "re-do her thoughts every morning".   [name] is able to recall that prior to patient's episode of  psychosis this year, she has exhibited periods of expansive energy and mood, with goal-directed activity, that last for a maximum of 4 days without significant disruption in her social  and occupational functioning.  Past Psychiatric Hx: Current Psychiatrist: [facility], started a month ago, has had 2 visits, patient is unsure if she would like to continue working with them. Current Therapist: [facility] in [place] Previous Psych Diagnoses: MDD, GAD, Schizoaffective disorder (bipolar type), Tic disorder Current psychiatric medications: Lamictal, risperidone Psychiatric medication history: Wellbutrin, Zoloft, Abilify Prior inpatient treatment: 2 (2017, 06/2022) Current/prior outpatient treatment: risperidone 1mg  daily, lamotrigine 25 mg BID Prior rehab hx: none on chart review Psychotherapy hx: Futures trader with [facility] History of suicide: 2 (2017, 08/2022) History of homicide or aggression: denies Psychiatric medication compliance history: compliant per patient Neuromodulation history: none on chart review   Substance Abuse Hx: Alcohol: started at age 32, drinking daily since age 8 (2 drinks, liquor). "It is hard to go a day without drinking". Denies withdrawal symptoms, including seizures. Tobacco: started at age 17, uses vape constantly Illicit drugs:  Rx drug abuse: denies Rehab hx: not assessed   Past Medical History: PCP: [facility] Dx: Hashimoto's Meds:Levothyroxine (not taking 08/2022) ALL: denies Hosp: denies Surgeries: denies Trauma: not assessed Seizures: denies   LMP: June 2024 Contraceptives: none, has scheduled appointment for Paraguard insertion next week (Aug. 2024)  Past Medical History:  Diagnosis Date   Family history of adverse reaction to anesthesia    father has woken up during surgery   Medical history non-contributory     Family History: Medical: father has a heart condition Psych: anxiety, depression Psych Rx: none on chart review SA/HA: father used to use cannabis Substance use family hx: Family History  Problem Relation Age of Onset   Depression Mother    Depression Father    Depression Brother     Social History: Living: in apartment with boyfriend, Thadius. Family (mother, father. Brother) lives 30 min away. 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 Work: Unemployed Finances: Currently depends on boyfriend Marital Status: Single Children: Denies   Abuse: Denies Armed forces operational officer: Denies Hotel manager: Denies  Current Medications: Current Facility-Administered Medications  Medication Dose Route Frequency Provider Last Rate Last Admin   acetaminophen (TYLENOL) tablet 650 mg  650 mg Oral Q6H PRN Sunday Corn, NP       alum & mag hydroxide-simeth (MAALOX/MYLANTA) 200-200-20 MG/5ML suspension 30 mL  30 mL Oral Q4H PRN Sunday Corn, NP       busPIRone (BUSPAR) tablet 10 mg  10 mg Oral TID Lorri Frederick, MD   10 mg at 09/02/22 1646   cariprazine (VRAYLAR) capsule 1.5 mg  1.5 mg Oral Daily Karie Fetch, MD       diphenhydrAMINE (BENADRYL) capsule 50 mg  50 mg Oral TID PRN Sunday Corn, NP       Or   diphenhydrAMINE (BENADRYL) injection 50 mg  50 mg Intramuscular TID PRN Sunday Corn, NP       haloperidol (HALDOL) tablet 5 mg  5 mg Oral TID PRN Sunday Corn, NP       Or   haloperidol lactate (HALDOL) injection 5 mg  5 mg Intramuscular TID PRN Sunday Corn, NP       hydrOXYzine (ATARAX) tablet 50 mg  50 mg Oral TID PRN Lorri Frederick, MD   50 mg at 09/02/22 2101   lamoTRIgine (LAMICTAL) tablet 50 mg  50 mg Oral BID Lorri Frederick, MD   50 mg at 09/02/22 1646   LORazepam (ATIVAN) tablet 2 mg  2 mg Oral TID PRN Sunday Corn, NP       Or   LORazepam (ATIVAN) injection 2 mg  2 mg Intramuscular TID PRN Sunday Corn, NP       magnesium hydroxide (MILK OF MAGNESIA) suspension 30 mL  30 mL Oral Daily PRN Sunday Corn, NP       nicotine (NICODERM CQ - dosed in mg/24 hours) patch 21 mg  21 mg Transdermal Daily Massengill, Harrold Donath, MD   21 mg at 09/01/22 0752   traZODone  (DESYREL) tablet 50 mg  50 mg Oral QHS Carrion-Carrero, Karle Starch, MD   50 mg at 09/01/22 2105    Lab Results:  No results found for this or any previous visit (from the past 48 hour(s)).   Blood Alcohol level:  Lab Results  Component Value Date   ETH <10 08/30/2022   ETH <10 05/18/2022    Metabolic Labs: Lab Results  Component Value Date   HGBA1C 5.3 08/30/2022   MPG 105.41 08/30/2022   MPG 91.06 07/05/2022   Lab Results  Component Value Date   PROLACTIN 43.7 (H) 08/31/2022   PROLACTIN 34.9 (H) 08/30/2022   Lab Results  Component Value Date   CHOL 205 (H) 08/30/2022   TRIG 43 08/30/2022   HDL 86 08/30/2022   CHOLHDL 2.4 08/30/2022   VLDL 9 08/30/2022   LDLCALC 110 (H) 08/30/2022   LDLCALC 67 07/05/2022    Sleep:No data recorded  Physical Findings: AIMS: not assesed  Psychiatric Specialty Exam:  Presentation  General Appearance: Appropriate for Environment  Eye Contact:Good  Speech:Clear and Coherent; Normal Rate  Speech Volume:Normal  Handedness:-- (not assessed)   Mood and Affect  Mood: Drowsy  Affect:Congruent   Thought Process  Thought Processes:Goal Directed  Descriptions of Associations:Intact  Orientation:Full (Time, Place and Person)  Thought Content:Logical  History of Schizophrenia/Schizoaffective disorder:Yes  Duration of Psychotic Symptoms:N/A  Hallucinations: None  Ideas of Reference:None  Suicidal Thoughts: None  Homicidal Thoughts: None   Sensorium  Memory:Immediate Good; Recent Fair; Remote Fair  Judgment:Fair  Insight:Fair   Executive Functions  Concentration:Fair  Attention Span:Fair  Recall:Fair  Fund of Knowledge:Fair  Language:Good   Psychomotor Activity  Psychomotor Activity: Normal   Assets  Assets:Communication Skills; Resilience; Social Support   Sleep  Sleep: 7 hours    Physical Exam: Physical Exam Vitals and nursing note reviewed.  Constitutional:      General: She is not  in acute distress.    Appearance: Normal appearance.  HENT:     Head: Normocephalic and atraumatic.  Pulmonary:     Effort: Pulmonary effort is normal.  Musculoskeletal:        General: Normal range of motion.  Neurological:     General: No focal deficit present.     Mental Status: She is alert.    Review of Systems  Gastrointestinal:  Negative for abdominal pain, diarrhea and nausea.  Psychiatric/Behavioral:  Negative for hallucinations, memory loss and suicidal ideas. The patient is not nervous/anxious and does not have insomnia.    Blood pressure 101/61, pulse 96, temperature 98.4 F (36.9 C), temperature source Oral, resp. rate 13, height 5\' 7"  (1.702 m), weight 65.2 kg, SpO2 98%. Body mass index is 22.52 kg/m.  Treatment Plan Summary: Daily contact with patient to assess and evaluate symptoms and progress in treatment and Medication management  ASSESSMENT: Jennifer York is a 32 year old female with a past psychiatric history of MDD, GAD, recent Schizoaffective disorder (bipolar type) diagnosis, Cannabis-induced psychosis, Polysubstance use disorder (tobacco, alcohol, LSD, mushroom) and 2 prior psychiatric hospitalizations (most recent June 2024) who presented to [facility] voluntarily on 8/7 with complaints of active suicide ideation with means to carry out plan.    8/8: Patient's symptoms are consistent with a tentative diagnosis of bipolar 1 disorder, current episode depressed.  Patient is unable to detail or specify manic symptoms, but does appear to have periods of expansive energy and mood.  She also appears to meet criteria for the diagnosis of GAD, given patient's report of excessive anxiety and worry associated with somatic symptoms. Patient has a history of polysubstance use, but has mostly used cannabis.  Patient does mention a previous diagnosis of schizoaffective disorder, currently do not think this is the most accurate diagnosis given the onset of psychosis  is congruent with the onset of cannabis use.  8/9: Patient appears less depressed compared to yesterday. Paper copy of Y-BOCS given to patient to complete today.  Based on collateral call with patient's boyfriend, it appears she may have had hypomanic episodes in her past.  She continues to report anxiety, and is likely experiencing cannabis withdrawal.  Will continue to increase her Lamictal, also added scheduled BuSpar.   8/10: Continues to report feeling depressed and depersonalization. Discussed that increased lamictal will hopefully help. Discussed decreased concentration and depersonalization may also be symptom of depression. Discussed discontinuing zoloft to decrease likelihood of going into manic episode. Plan to discuss restarting an additional mood stabilizing antipsychotic such as abilify or seroquel.  8/11: Episode of hypotension, suspect related to dehydration. Starting vraylar today. Stopping buspar due to concern for contributing to drowsiness.   Diagnoses / Active Problems: Bipolar II disorder, current episode depressed Generalized anxiety disorder Cannabis use disorder Cannabis induced psychosis, cannabis induced mania R/o out eating disorder R/o OCD  PLAN: Safety and Monitoring:  -- Voluntary admission to inpatient psychiatric unit for safety, stabilization and treatment  -- Daily contact with patient to assess and evaluate symptoms and progress in treatment  -- Patient's case to be discussed in multi-disciplinary team meeting  -- Observation Level : q15 minute checks  -- Vital signs:  q12 hours  -- Precautions: suicide, elopement, and assault  2. Psychiatric Diagnoses and Treatment:  Continue Lamictal 50 mg Q12Hrs today STOP Zoloft 50 mg daily for depression, low threshold to discontinue if she begins to cycle into mania/hypomania STOP Buspar 10 mg 3 times daily today due to increased drowsiness.  Scheduled trazodone 50 mg nightly for insomnia STARTED vraylar 1.5mg   for depression and mood stabilization Risperdal discontinued on admission due to galactorrhea and amenorrhea Prolactin on 08/31/2022: 43.7  May repeat prolactin level by discharge -- The risks/benefits/side-effects/alternatives to this medication were discussed in detail with the patient and time was given for questions. The patient consents to medication trial.              -- Metabolic profile and EKG monitoring obtained while on an atypical antipsychotic  BMI: 22.52 kg/m TSH: 1.378 on 8/7 Lipid Panel: Cholesterol 205 and LDL 110 on 8/7 HbgA1c: 5.3 on 8/7 QTc: 456 on 8/7:              -- Encouraged patient to participate in unit milieu and in scheduled group therapies   -- Short Term Goals: Ability to identify changes in lifestyle to reduce recurrence of  condition will improve and Ability to verbalize feelings will improve  -- Long Term Goals: Improvement in symptoms so as ready for discharge Other PRNS Maalox/Mylanta, Tylenol, milk of magnesia, agitation protocol (Benadryl/Haldol/Ativan)   3. Medical Issues Being Addressed:   Tobacco Use Disorder  -- Nicotine patch 21mg /24 hours ordered  -- Smoking cessation encouraged  #Hypothyroidism -- TSH WNL on 08/30/2022  4. Discharge Planning:   -- Social work and case management to assist with discharge planning and identification of hospital follow-up needs prior to discharge  -- Estimated LOS: 5-7 days  -- Discharge Concerns: Need to establish a safety plan; Medication compliance and effectiveness  -- Discharge Goals: Return home with outpatient referrals for mental health follow-up including medication management/psychotherapy   I certify that inpatient services furnished can reasonably be expected to improve the patient's condition.   This note was created using a voice recognition software as a result there may be grammatical errors inadvertently enclosed that do not reflect the nature of this encounter. Every attempt is made to correct  such errors.   Karie Fetch, MD PGY-2, Psychiatry Residency  8/11/20246:55 AM

## 2022-09-03 NOTE — Progress Notes (Signed)
Unable to obtain repeat vitals at this time. Pt is with MD

## 2022-09-03 NOTE — Progress Notes (Signed)
   09/03/22 0759  Psych Admission Type (Psych Patients Only)  Admission Status Voluntary  Psychosocial Assessment  Patient Complaints Anxiety  Eye Contact Fair  Facial Expression Flat  Affect Flat  Speech Logical/coherent  Interaction Assertive  Motor Activity Unsteady (WDL)  Appearance/Hygiene Unremarkable  Behavior Characteristics Cooperative  Mood Depressed  Thought Process  Coherency WDL  Content WDL  Delusions None reported or observed  Perception WDL  Hallucination None reported or observed  Judgment WDL  Confusion None  Danger to Self  Current suicidal ideation? Denies  Self-Injurious Behavior No self-injurious ideation or behavior indicators observed or expressed   Agreement Not to Harm Self Yes  Description of Agreement verbal  Danger to Others  Danger to Others None reported or observed

## 2022-09-03 NOTE — Group Note (Signed)
Date:  09/03/2022 Time:  4:56 PM  Group Topic/Focus:  Goals Group:   The focus of this group is to help patients establish daily goals to achieve during treatment and discuss how the patient can incorporate goal setting into their daily lives to aide in recovery. Orientation:   The focus of this group is to educate the patient on the purpose and policies of crisis stabilization and provide a format to answer questions about their admission.  The group details unit policies and expectations of patients while admitted.    Participation Level:  Did Not Attend  Participation Quality:   n/a  Affect:   n/a  Cognitive:   n/a  Insight: None  Engagement in Group:   n/a  Modes of Intervention:   n/a  Additional Comments:   Pt did not attend.  [name] 09/03/2022, 4:56 PM

## 2022-09-04 DIAGNOSIS — F332 Major depressive disorder, recurrent severe without psychotic features: Secondary | ICD-10-CM | POA: Diagnosis not present

## 2022-09-04 MED ORDER — NICOTINE POLACRILEX 2 MG MT GUM
2.0000 mg | CHEWING_GUM | OROMUCOSAL | Status: DC | PRN
  Administered 2022-09-04 – 2022-09-07 (×7): 2 mg via ORAL
  Filled 2022-09-04 (×3): qty 1

## 2022-09-04 MED ORDER — BUSPIRONE HCL 10 MG PO TABS
10.0000 mg | ORAL_TABLET | Freq: Three times a day (TID) | ORAL | Status: DC
  Administered 2022-09-05 – 2022-09-07 (×7): 10 mg via ORAL
  Filled 2022-09-04 (×12): qty 1

## 2022-09-04 MED ORDER — BUSPIRONE HCL 5 MG PO TABS
5.0000 mg | ORAL_TABLET | Freq: Three times a day (TID) | ORAL | Status: AC
  Administered 2022-09-04 – 2022-09-05 (×3): 5 mg via ORAL
  Filled 2022-09-04 (×4): qty 1

## 2022-09-04 NOTE — Plan of Care (Signed)
  Problem: Education: Goal: Knowledge of Jennifer York General Education information/materials will improve Outcome: Progressing Goal: Mental status will improve Outcome: Progressing Goal: Verbalization of understanding the information provided will improve Outcome: Progressing   Problem: Activity: Goal: Sleeping patterns will improve Outcome: Progressing

## 2022-09-04 NOTE — Plan of Care (Signed)
  Problem: Coping: Goal: Ability to verbalize frustrations and anger appropriately will improve Outcome: Progressing   Problem: Health Behavior/Discharge Planning: Goal: Compliance with treatment plan for underlying cause of condition will improve Outcome: Progressing   

## 2022-09-04 NOTE — Progress Notes (Signed)
   09/04/22 1100  Psych Admission Type (Psych Patients Only)  Admission Status Voluntary  Psychosocial Assessment  Patient Complaints Anxiety;Depression  Eye Contact Fair  Facial Expression Flat  Affect Flat  Speech Logical/coherent  Interaction Assertive  Motor Activity Other (Comment) (wnl)  Appearance/Hygiene Unremarkable  Behavior Characteristics Cooperative  Mood Depressed;Anxious;Pleasant  Thought Process  Coherency WDL  Content WDL  Delusions None reported or observed  Perception WDL  Hallucination None reported or observed  Judgment Impaired  Confusion None  Danger to Self  Current suicidal ideation? Denies  Self-Injurious Behavior No self-injurious ideation or behavior indicators observed or expressed   Agreement Not to Harm Self Yes  Description of Agreement verbal  Danger to Others  Danger to Others None reported or observed

## 2022-09-04 NOTE — Group Note (Signed)
Occupational Therapy Group Note  Group Topic: Sleep Hygiene  Group Date: 09/04/2022 Start Time: 1430 End Time: 1505 Facilitators: Ted Mcalpine, OT   Group Description: Group encouraged increased participation and engagement through topic focused on sleep hygiene. Patients reflected on the quality of sleep they typically receive and identified areas that need improvement. Group was given background information on sleep and sleep hygiene, including common sleep disorders. Group members also received information on how to improve one's sleep and introduced a sleep diary as a tool that can be utilized to track sleep quality over a length of time. Group session ended with patients identifying one or more strategies they could utilize or implement into their sleep routine in order to improve overall sleep quality.        Therapeutic Goal(s):  Identify one or more strategies to improve overall sleep hygiene  Identify one or more areas of sleep that are negatively impacted (sleep too much, too little, etc)     Participation Level: Engaged   Participation Quality: Independent   Behavior: Appropriate   Speech/Thought Process: Relevant   Affect/Mood: Appropriate   Insight: Fair   Judgement: Fair      Modes of Intervention: Education  Patient Response to Interventions:  Attentive   Plan: Continue to engage patient in OT groups 2 - 3x/week.  09/04/2022  Ted Mcalpine, OT  Kerrin Champagne, OT

## 2022-09-04 NOTE — Progress Notes (Signed)
   09/04/22 2040  Psych Admission Type (Psych Patients Only)  Admission Status Voluntary  Psychosocial Assessment  Patient Complaints Anxiety;Depression;Helplessness  Eye Contact Fair  Facial Expression Flat  Affect Flat  Speech Logical/coherent  Interaction Assertive  Motor Activity Other (Comment)  Appearance/Hygiene Unremarkable  Behavior Characteristics Cooperative  Mood Depressed;Anxious  Thought Process  Coherency WDL  Content WDL  Delusions None reported or observed  Perception WDL  Hallucination None reported or observed  Judgment Impaired  Confusion None  Danger to Self  Current suicidal ideation? Denies  Self-Injurious Behavior No self-injurious ideation or behavior indicators observed or expressed   Agreement Not to Harm Self Yes  Description of Agreement Verbal  Danger to Others  Danger to Others None reported or observed

## 2022-09-04 NOTE — [facility] Group Notes (Signed)
Spiritual care group facilitated by Chaplain Dyanne Carrel, [facility]  Group focused on topic of strength. Group members reflected on what thoughts and feelings emerge when they hear this topic. They then engaged in facilitated dialog around how strength is present in their lives. This dialog focused on representing what strength had been to them in their lives (images and patterns given) and what they saw as helpful in their life now (what they needed / wanted).  Activity drew on narrative framework.  Patient Progress: Jennifer York attended group and actively engaged and participated in group conversation and activities.  Jennifer York asked a question that contributed positively to the group conversation.

## 2022-09-04 NOTE — [facility] Group Notes (Signed)
Patient did not attend the AA group.

## 2022-09-04 NOTE — Progress Notes (Addendum)
[facility] MD Progress Note  09/04/2022 12:11 PM Jennifer York  MRN:  [id number]  Principal Problem: MDD (major depressive disorder), recurrent severe, without psychosis (HCC) Diagnosis: Principal Problem:   MDD (major depressive disorder), recurrent severe, without psychosis (HCC)  Reason for Admission:  Jennifer York is a 32 year old female with a past psychiatric history of MDD, GAD, recent Schizoaffective disorder (bipolar type) diagnosis, Cannabis-induced psychosis, Polysubstance use disorder (tobacco, alcohol, LSD, mushroom) and 2 prior psychiatric hospitalizations (most recent June 2024) who presented to [facility] voluntarily on 08/30/2022 with complaints of active suicide ideation with means to carry out plan. Her UDS was positive for THC on admission. (admitted on 08/30/2022, total  LOS: 5 days )  Yesterday, the psychiatry team made following recommendations:  Continue Lamictal 50 mg Q12Hrs today STOP Zoloft 50 mg daily for depression, low threshold to discontinue if she begins to cycle into mania/hypomania STOP Buspar 10 mg 3 times daily today due to increased drowsiness.  Scheduled trazodone 50 mg nightly for insomnia STARTED vraylar 1.5mg  for depression and mood stabilization Risperdal discontinued on admission due to galactorrhea and amenorrhea Prolactin 34.9>43.7(08/31/2022)  May repeat prolactin level by discharge  Pertinent information discussed during bed progression: Patient slept 7.75 hours last night, had difficulty coming out of her bed.   Information Obtained Today During Patient Interview:  Patient assessed on the unit. She reports modest improvements in mood. Started vraylar yesterday, no identifiable changes since starting. Both sleep and appetite are adequate.  Reports she is maintaining adequate fluid intake throughout the day.  When asked about suicidal ideations, patient states "it is too early to tell".  She admits to passive thoughts of death and dying that are  "slightly better" per patient.  Denies any intent or plan and contracts for safety on the unit.  Patient is predominantly concerned about her fatigue, which she reports has not changed significantly since admission.  Patient denies homicidal ideation.  She denies auditory and visual hallucinations.  She denies paranoid ideations.  Patient denies any delusional thought processes.  No somatic complaints.  Denies any orthostasis since drinking more fluids.  Reports regular bowel movements.  Denies any side effects to currently prescribed psychiatric medications.  Patient was educated on possible side effects are from Naomi which are modest and include increased sedation, headaches, or EPS (less likely at her current dose).  Patient gave [place] permission to speak to her boyfriend, [name] [phone], for collateral on 09/01/2022: [name] reports that he has been dating the patient for 5 years. He shared that he spoke to the patient last night and she sounded calm and less depressed than earlier in her admission.  Confirms patient's psychotic episode from February to June, confirms patient had increased the frequency of her cannabis use a month prior to the onset of her symptoms.  During this period of psychosis, he reports the patient had increased energy, decreased need for sleep and diminished appetite.  He described her delusional content as fantastical thinking, also alludes to some grandiosity as patient attempted to teach/correct patient on mathematical topics (for context boyfriend is an Art gallery manager, patient has no background in this field close).  Since patient's last discharge from [facility] in June, he has not noted significant psychosis, but notes the patient has admitted to feeling like she has to "re-do her thoughts every morning".   [name] is able to recall that prior to patient's episode of psychosis this year, she has exhibited periods of expansive energy and mood, with  goal-directed activity, that  last for a maximum of 4 days without significant disruption in her social and occupational functioning.  Past Psychiatric Hx: Current Psychiatrist: [facility], started a month ago, has had 2 visits, patient is unsure if she would like to continue working with them. Current Therapist: [facility] [place] Previous Psych Diagnoses: MDD, GAD, Schizoaffective disorder (bipolar type), Tic disorder Current psychiatric medications: Lamictal, risperidone Psychiatric medication history: Wellbutrin, Zoloft, Abilify Prior inpatient treatment: 2 (2017, 06/2022) Current/prior outpatient treatment: risperidone 1mg  daily, lamotrigine 25 mg BID Prior rehab hx: none on chart review Psychotherapy hx: Futures trader with [facility] History of suicide: 2 (2017, 08/2022) History of homicide or aggression: denies Psychiatric medication compliance history: compliant per patient Neuromodulation history: none on chart review   Substance Abuse Hx: Alcohol: started at age 89, drinking daily since age 36 (2 drinks, liquor). "It is hard to go a day without drinking". Denies withdrawal symptoms, including seizures. Tobacco: started at age 65, uses vape constantly Illicit drugs:  Rx drug abuse: denies Rehab hx: not assessed   Past Medical History: PCP: [facility] Dx: Hashimoto's Meds:Levothyroxine (not taking 08/2022) ALL: denies Hosp: denies Surgeries: denies Trauma: not assessed Seizures: denies   LMP: June 2024 Contraceptives: none, has scheduled appointment for Paraguard insertion next week (Aug. 2024)  Past Medical History:  Diagnosis Date   Family history of adverse reaction to anesthesia    father has woken up during surgery   Medical history non-contributory     Family History: Medical: father has a heart condition Psych: anxiety, depression Psych Rx: none on chart review SA/HA: father used to use cannabis Substance use family hx: Family History  Problem Relation Age of Onset    Depression Mother    Depression Father    Depression Brother    Social History: Living: in apartment with boyfriend, Thadius. Family (mother, father. Brother) lives 30 min away. 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 Work: Unemployed Finances: Currently depends on boyfriend Marital Status: Single Children: Denies   Abuse: Denies Armed forces operational officer: Denies Hotel manager: Denies  Current Medications: Current Facility-Administered Medications  Medication Dose Route Frequency Provider Last Rate Last Admin   acetaminophen (TYLENOL) tablet 650 mg  650 mg Oral Q6H PRN Sunday Corn, NP       alum & mag hydroxide-simeth (MAALOX/MYLANTA) 200-200-20 MG/5ML suspension 30 mL  30 mL Oral Q4H PRN Sunday Corn, NP       cariprazine (VRAYLAR) capsule 1.5 mg  1.5 mg Oral Daily Karie Fetch, MD   1.5 mg at 09/04/22 7829   diphenhydrAMINE (BENADRYL) capsule 50 mg  50 mg Oral TID PRN Sunday Corn, NP       Or   diphenhydrAMINE (BENADRYL) injection 50 mg  50 mg Intramuscular TID PRN Sunday Corn, NP       haloperidol (HALDOL) tablet 5 mg  5 mg Oral TID PRN Sunday Corn, NP       Or   haloperidol lactate (HALDOL) injection 5 mg  5 mg Intramuscular TID PRN Sunday Corn, NP       hydrOXYzine (ATARAX) tablet 25 mg  25 mg Oral TID PRN Bobbitt, Shalon E, NP   25 mg at 09/04/22 1105   lamoTRIgine (LAMICTAL) tablet 50 mg  50 mg Oral BID Lorri Frederick, MD   50 mg at 09/04/22 0905   LORazepam (ATIVAN) tablet 2 mg  2 mg Oral TID PRN Sunday Corn,  NP       Or   LORazepam (ATIVAN) injection 2 mg  2 mg Intramuscular TID PRN Sunday Corn, NP       magnesium hydroxide (MILK OF MAGNESIA) suspension 30 mL  30 mL Oral Daily PRN Sunday Corn, NP       nicotine polacrilex (NICORETTE) gum 2 mg  2 mg Oral PRN Abbott Pao, Nadir, MD   2 mg at 09/04/22 1108   traZODone (DESYREL) tablet 50 mg  50 mg Oral QHS Carrion-Carrero,  Yariana Hoaglund, MD   50 mg at 09/01/22 2105    Lab Results:  No results found for this or any previous visit (from the past 48 hour(s)).   Blood Alcohol level:  Lab Results  Component Value Date   ETH <10 08/30/2022   ETH <10 05/18/2022    Metabolic Labs: Lab Results  Component Value Date   HGBA1C 5.3 08/30/2022   MPG 105.41 08/30/2022   MPG 91.06 07/05/2022   Lab Results  Component Value Date   PROLACTIN 43.7 (H) 08/31/2022   PROLACTIN 34.9 (H) 08/30/2022   Lab Results  Component Value Date   CHOL 205 (H) 08/30/2022   TRIG 43 08/30/2022   HDL 86 08/30/2022   CHOLHDL 2.4 08/30/2022   VLDL 9 08/30/2022   LDLCALC 110 (H) 08/30/2022   LDLCALC 67 07/05/2022    Sleep:Sleep: Good   Physical Findings: AIMS: not assesed  Psychiatric Specialty Exam:   Presentation  General Appearance: Appropriate for Environment; Casual; Fairly Groomed  Eye Contact:Fair  Speech:Clear and Coherent; Normal Rate  Speech Volume:Normal  Handedness:-- (Not assessed)   Mood and Affect  Mood: Drowsy  Affect:Appropriate; Full Range; Congruent   Thought Process  Thought Processes:Coherent; Goal Directed; Linear  Descriptions of Associations:Intact  Orientation:Full (Time, Place and Person)  Thought Content:Logical; WDL  History of Schizophrenia/Schizoaffective disorder:No  Duration of Psychotic Symptoms:N/A  Hallucinations: None  Ideas of Reference:None  Suicidal Thoughts: None  Homicidal Thoughts: None   Sensorium  Memory:Immediate Fair; Recent Fair; Remote Fair  Judgment:Fair  Insight:Fair   Executive Functions  Concentration:Good  Attention Span:Good  Recall:Good  Fund of Knowledge:Good  Language:Good   Psychomotor Activity  Psychomotor Activity: Normal   Assets  Assets:Communication Skills; Desire for Improvement; Resilience   Sleep  Sleep: 7 hours    Physical Exam:  Physical Exam Vitals and nursing note reviewed.  Constitutional:       General: She is not in acute distress.    Appearance: Normal appearance.  HENT:     Head: Normocephalic and atraumatic.  Pulmonary:     Effort: Pulmonary effort is normal.  Musculoskeletal:        General: Normal range of motion.  Neurological:     General: No focal deficit present.     Mental Status: She is alert.    Review of Systems  Gastrointestinal:  Negative for abdominal pain, diarrhea and nausea.  Psychiatric/Behavioral:  Negative for hallucinations, memory loss and suicidal ideas. The patient is not nervous/anxious and does not have insomnia.    Blood pressure 98/77, pulse (!) 103, temperature 98.4 F (36.9 C), temperature source Oral, resp. rate 13, height 5\' 7"  (1.702 m), weight 65.2 kg, SpO2 97%. Body mass index is 22.52 kg/m.  Treatment Plan Summary: Daily contact with patient to assess and evaluate symptoms and progress in treatment and Medication management   ASSESSMENT: Jennifer York is a 32 year old female with a past psychiatric history of MDD, GAD, recent Schizoaffective disorder (bipolar type)  diagnosis, Cannabis-induced psychosis, Polysubstance use disorder (tobacco, alcohol, LSD, mushroom) and 2 prior psychiatric hospitalizations (most recent June 2024) who presented to [facility] voluntarily on 8/7 with complaints of active suicide ideation with means to carry out plan.    8/8: Patient's symptoms are consistent with a tentative diagnosis of bipolar 1 disorder, current episode depressed.  Patient is unable to detail or specify manic symptoms, but does appear to have periods of expansive energy and mood.  She also appears to meet criteria for the diagnosis of GAD, given patient's report of excessive anxiety and worry associated with somatic symptoms. Patient has a history of polysubstance use, but has mostly used cannabis.  Patient does mention a previous diagnosis of schizoaffective disorder, currently do not think this is the most accurate diagnosis given  the onset of psychosis is congruent with the onset of cannabis use.  8/9: Patient appears less depressed compared to yesterday. Paper copy of Y-BOCS given to patient to complete today.  Based on collateral call with patient's boyfriend, it appears she may have had hypomanic episodes in her past.  She continues to report anxiety, and is likely experiencing cannabis withdrawal.  Will continue to increase her Lamictal, also added scheduled BuSpar.   8/10: Continues to report feeling depressed and depersonalization. Discussed that increased lamictal will hopefully help. Discussed decreased concentration and depersonalization may also be symptom of depression. Discussed discontinuing zoloft to decrease likelihood of going into manic episode. Plan to discuss restarting an additional mood stabilizing antipsychotic such as abilify or seroquel.  8/11: Episode of hypotension, suspect related to dehydration. Starting vraylar today. Stopping buspar due to concern for contributing to drowsiness.   8/12: No significant changes reported today.  She continues to endorse fatigue and anxiety.  Plan is to continue titrating Vraylar and Lamictal until remission of symptoms is achieved.  Diagnoses / Active Problems: Bipolar II disorder, current episode depressed Generalized anxiety disorder Cannabis use disorder Cannabis induced psychosis, cannabis induced mania R/o out eating disorder R/o OCD  PLAN: Safety and Monitoring:  -- Voluntary admission to inpatient psychiatric unit for safety, stabilization and treatment  -- Daily contact with patient to assess and evaluate symptoms and progress in treatment  -- Patient's case to be discussed in multi-disciplinary team meeting  -- Observation Level : q15 minute checks  -- Vital signs:  q12 hours  -- Precautions: suicide, elopement, and assault  2. Psychiatric Diagnoses and Treatment:  Continue Lamictal 50 mg Q12Hrs for mood stabilization with plans to increase over  the week Scheduled trazodone 50 mg nightly for insomnia Continue vraylar 1.5mg  for bipolar depression and mood stabilization Risperdal discontinued on admission due to galactorrhea and amenorrhea Prolactin on 08/31/2022: 43.7  May repeat prolactin level by discharge -- The risks/benefits/side-effects/alternatives to this medication were discussed in detail with the patient and time was given for questions. The patient consents to medication trial.              -- Metabolic profile and EKG monitoring obtained while on an atypical antipsychotic  BMI: 22.52 kg/m TSH: 1.378 on 8/7 Lipid Panel: Cholesterol 205 and LDL 110 on 8/7 HbgA1c: 5.3 on 8/7 QTc: 456 on 8/7:              -- Encouraged patient to participate in unit milieu and in scheduled group therapies   -- Short Term Goals: Ability to identify changes in lifestyle to reduce recurrence of condition will improve and Ability to verbalize feelings will improve  -- Long  Term Goals: Improvement in symptoms so as ready for discharge Other PRNS Maalox/Mylanta, Tylenol, milk of magnesia, agitation protocol (Benadryl/Haldol/Ativan)   3. Medical Issues Being Addressed:   Tobacco Use Disorder  -- Nicotine patch 21mg /24 hours ordered  -- Smoking cessation encouraged  #Hypothyroidism -- TSH WNL on 08/30/2022  4. Discharge Planning:   -- Social work and case management to assist with discharge planning and identification of hospital follow-up needs prior to discharge  -- Estimated LOS: 5-7 days  -- Discharge Concerns: Need to establish a safety plan; Medication compliance and effectiveness  -- Discharge Goals: Return home with outpatient referrals for mental health follow-up including medication management/psychotherapy   I certify that inpatient services furnished can reasonably be expected to improve the patient's condition.   This note was created using a voice recognition software as a result there may be grammatical errors inadvertently  enclosed that do not reflect the nature of this encounter. Every attempt is made to correct such errors.   Dr. Liston Alba, MD PGY-2, Psychiatry Residency  8/12/202412:11 PM

## 2022-09-05 MED ORDER — CARIPRAZINE HCL 3 MG PO CAPS
3.0000 mg | ORAL_CAPSULE | Freq: Every day | ORAL | Status: DC
  Administered 2022-09-06 – 2022-09-07 (×2): 3 mg via ORAL
  Filled 2022-09-05 (×4): qty 1

## 2022-09-05 MED ORDER — LAMOTRIGINE 25 MG PO TABS
75.0000 mg | ORAL_TABLET | Freq: Two times a day (BID) | ORAL | Status: DC
  Administered 2022-09-05 – 2022-09-07 (×4): 75 mg via ORAL
  Filled 2022-09-05 (×8): qty 3

## 2022-09-05 NOTE — Progress Notes (Signed)
   09/05/22 1700  Psych Admission Type (Psych Patients Only)  Admission Status Voluntary  Psychosocial Assessment  Patient Complaints Anxiety;Depression;Other (Comment) (lack of motivation)  Eye Contact Fair  Facial Expression Flat  Affect Flat  Speech Logical/coherent  Interaction Assertive  Motor Activity Other (Comment) (wnl)  Appearance/Hygiene Unremarkable  Behavior Characteristics Cooperative  Mood Depressed;Anxious;Anhedonia;Apathetic  Thought Process  Coherency WDL  Content WDL  Delusions None reported or observed  Perception WDL  Hallucination None reported or observed  Judgment WDL  Confusion None  Danger to Self  Current suicidal ideation? Denies  Self-Injurious Behavior No self-injurious ideation or behavior indicators observed or expressed   Agreement Not to Harm Self Yes  Description of Agreement verbal  Danger to Others  Danger to Others None reported or observed

## 2022-09-05 NOTE — Progress Notes (Signed)
[facility] MD Progress Note  09/05/2022 11:18 AM Jennifer York  MRN:  [id number]  Principal Problem: MDD (major depressive disorder), recurrent severe, without psychosis (HCC) Diagnosis: Principal Problem:   MDD (major depressive disorder), recurrent severe, without psychosis (HCC)  Reason for Admission:  Jennifer York is a 32 year old female with a past psychiatric history of MDD, GAD, recent Schizoaffective disorder (bipolar type) diagnosis, Cannabis-induced psychosis, Polysubstance use disorder (tobacco, alcohol, LSD, mushroom) and 2 prior psychiatric hospitalizations (most recent June 2024) who presented to [facility] voluntarily on 08/30/2022 with complaints of active suicide ideation with means to carry out plan. Her UDS was positive for THC on admission. (admitted on 08/30/2022, total  LOS: 6 days )  Yesterday, the psychiatry team made following recommendations:  Continue Lamictal 50 mg Q12Hrs for mood stabilization with plans to increase over the week Scheduled trazodone 50 mg nightly for insomnia Continue vraylar 1.5mg  for bipolar depression and mood stabilization Risperdal discontinued on admission due to galactorrhea and amenorrhea Restart BuSpar 5 mg 3 times daily for 1 day then titrate to 10 mg 3 times daily daily for anxiety  Pertinent information discussed during bed progression: No acute concerns overnight.  Information Obtained Today During Patient Interview:  Patient seen at bedside.  Reports less depression and less anxiety today.  She is unsure if restarting BuSpar has helped, but notes "modest improvements" in mood today.  She reports adequate sleep and appetite.  Describes feeling her thoughts as "more put together" today.  She denies experiencing any suicidal ideations today.  Patient reports she has been experiencing diminished concentration, reassured that this may be a symptom of depression that will improve with continued antidepressant use.  Patient denies homicidal  ideation.  She denies auditory and visual hallucinations.  Denies thought insertion, thought withdrawal, thought broadcasting, and ideas of reference.  There are no paranoid ideations.  Patient denies any side effects to currently prescribed psychiatric medications.  No somatic complaints.  Reports regular bowel movements.  Patient gave [place] permission to speak to her boyfriend, [name] [phone], for collateral on 09/01/2022: [name] reports that he has been dating the patient for 5 years. He shared that he spoke to the patient last night and she sounded calm and less depressed than earlier in her admission.  Confirms patient's psychotic episode from February to June, confirms patient had increased the frequency of her cannabis use a month prior to the onset of her symptoms.  During this period of psychosis, he reports the patient had increased energy, decreased need for sleep and diminished appetite.  He described her delusional content as fantastical thinking, also alludes to some grandiosity as patient attempted to teach/correct patient on mathematical topics (for context boyfriend is an Art gallery manager, patient has no background in this field close).  Since patient's last discharge from [facility] in June, he has not noted significant psychosis, but notes the patient has admitted to feeling like she has to "re-do her thoughts every morning".   [name] is able to recall that prior to patient's episode of psychosis this year, she has exhibited periods of expansive energy and mood, with goal-directed activity, that last for a maximum of 4 days without significant disruption in her social and occupational functioning.  Past Psychiatric Hx: Current Psychiatrist: [facility], started a month ago, has had 2 visits, patient is unsure if she would like to continue working with them. Current Therapist: [facility] in [place] Previous Psych Diagnoses: MDD, GAD, Schizoaffective disorder (bipolar type), Tic  disorder  Current psychiatric medications: Lamictal, risperidone Psychiatric medication history: Wellbutrin, Zoloft, Abilify Prior inpatient treatment: 2 (2017, 06/2022) Current/prior outpatient treatment: risperidone 1mg  daily, lamotrigine 25 mg BID Prior rehab hx: none on chart review Psychotherapy hx: Futures trader with [facility] History of suicide: 2 (2017, 08/2022) History of homicide or aggression: denies Psychiatric medication compliance history: compliant per patient Neuromodulation history: none on chart review   Substance Abuse Hx: Alcohol: started at age 80, drinking daily since age 79 (2 drinks, liquor). "It is hard to go a day without drinking". Denies withdrawal symptoms, including seizures. Tobacco: started at age 60, uses vape constantly Illicit drugs:  Rx drug abuse: denies Rehab hx: not assessed   Past Medical History: PCP: [facility] Dx: Hashimoto's Meds:Levothyroxine (not taking 08/2022) ALL: denies Hosp: denies Surgeries: denies Trauma: not assessed Seizures: denies   LMP: June 2024 Contraceptives: none, has scheduled appointment for Paraguard insertion next week (Aug. 2024)  Past Medical History:  Diagnosis Date   Family history of adverse reaction to anesthesia    father has woken up during surgery   Medical history non-contributory     Family History: Medical: father has a heart condition Psych: anxiety, depression Psych Rx: none on chart review SA/HA: father used to use cannabis Substance use family hx: Family History  Problem Relation Age of Onset   Depression Mother    Depression Father    Depression Brother    Social History: Living: in apartment with boyfriend, Thadius. Family (mother, father. Brother) lives 30 min away. 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 Work: Unemployed Finances: Currently depends on boyfriend Marital Status: Single Children: Denies    Abuse: Denies Armed forces operational officer: Denies Hotel manager: Denies  Current Medications: Current Facility-Administered Medications  Medication Dose Route Frequency Provider Last Rate Last Admin   acetaminophen (TYLENOL) tablet 650 mg  650 mg Oral Q6H PRN Sunday Corn, NP       alum & mag hydroxide-simeth (MAALOX/MYLANTA) 200-200-20 MG/5ML suspension 30 mL  30 mL Oral Q4H PRN Sunday Corn, NP       busPIRone (BUSPAR) tablet 10 mg  10 mg Oral TID Sarita Bottom, MD       cariprazine (VRAYLAR) capsule 1.5 mg  1.5 mg Oral Daily Karie Fetch, MD   1.5 mg at 09/05/22 4782   diphenhydrAMINE (BENADRYL) capsule 50 mg  50 mg Oral TID PRN Sunday Corn, NP       Or   diphenhydrAMINE (BENADRYL) injection 50 mg  50 mg Intramuscular TID PRN Sunday Corn, NP       haloperidol (HALDOL) tablet 5 mg  5 mg Oral TID PRN Sunday Corn, NP       Or   haloperidol lactate (HALDOL) injection 5 mg  5 mg Intramuscular TID PRN Sunday Corn, NP       hydrOXYzine (ATARAX) tablet 25 mg  25 mg Oral TID PRN Bobbitt, Shalon E, NP   25 mg at 09/04/22 1105   lamoTRIgine (LAMICTAL) tablet 50 mg  50 mg Oral BID Carrion-Carrero, Jinger Middlesworth, MD   50 mg at 09/05/22 0814   LORazepam (ATIVAN) tablet 2 mg  2 mg Oral TID PRN Sunday Corn, NP       Or   LORazepam (ATIVAN) injection 2 mg  2 mg Intramuscular TID PRN Sunday Corn, NP       magnesium hydroxide (MILK OF MAGNESIA) suspension 30 mL  30 mL Oral Daily PRN Sellers,  Verdon Cummins, NP       nicotine polacrilex (NICORETTE) gum 2 mg  2 mg Oral PRN Abbott Pao, Nadir, MD   2 mg at 09/04/22 1834   traZODone (DESYREL) tablet 50 mg  50 mg Oral QHS Carrion-Carrero, Franciszek Platten, MD   50 mg at 09/04/22 2046    Lab Results:  No results found for this or any previous visit (from the past 48 hour(s)).   Blood Alcohol level:  Lab Results  Component Value Date   ETH <10 08/30/2022   ETH <10 05/18/2022    Metabolic Labs: Lab Results  Component Value Date   HGBA1C 5.3  08/30/2022   MPG 105.41 08/30/2022   MPG 91.06 07/05/2022   Lab Results  Component Value Date   PROLACTIN 43.7 (H) 08/31/2022   PROLACTIN 34.9 (H) 08/30/2022   Lab Results  Component Value Date   CHOL 205 (H) 08/30/2022   TRIG 43 08/30/2022   HDL 86 08/30/2022   CHOLHDL 2.4 08/30/2022   VLDL 9 08/30/2022   LDLCALC 110 (H) 08/30/2022   LDLCALC 67 07/05/2022    Sleep:Sleep: Good   Physical Findings: AIMS: not assesed  Psychiatric Specialty Exam:  Presentation  General Appearance:  Appropriate for Environment; Casual; Fairly Groomed  Eye Contact: Fair  Speech: Clear and Coherent; Normal Rate  Speech Volume: Normal  Handedness: -- (Not assessed)   Mood and Affect  Mood: -- ("Better today")  Affect: Appropriate; Full Range; Congruent   Thought Process  Thought Processes: Coherent; Goal Directed; Linear  Descriptions of Associations:Intact  Orientation:Full (Time, Place and Person)  Thought Content:Logical; WDL  History of Schizophrenia/Schizoaffective disorder:Yes  Duration of Psychotic Symptoms:Less than six months  Hallucinations:Hallucinations: None  Ideas of Reference:None  Suicidal Thoughts:Suicidal Thoughts: No SI Passive Intent and/or Plan: Without Intent; Without Plan  Homicidal Thoughts:Homicidal Thoughts: No   Sensorium  Memory: Immediate Fair; Recent Fair; Remote Fair  Judgment: Fair  Insight: Fair   Executive Functions  Concentration: Good  Attention Span: Good  Recall: Good  Fund of Knowledge: Good  Language: Good   Psychomotor Activity  Psychomotor Activity: Psychomotor Activity: Normal   Assets  Assets: Communication Skills; Desire for Improvement; Resilience   Sleep  Sleep: Sleep: Good    Physical Exam: Physical Exam Vitals and nursing note reviewed.  Constitutional:      General: She is in acute distress.     Appearance: She is not ill-appearing.  HENT:     Head: Normocephalic  and atraumatic.  Pulmonary:     Effort: Pulmonary effort is normal. No respiratory distress.  Musculoskeletal:        General: Normal range of motion.  Skin:    General: Skin is warm and dry.    Review of Systems  Constitutional:  Negative for chills and fever.  Respiratory:  Negative for shortness of breath.   Cardiovascular:  Negative for chest pain and palpitations.  Gastrointestinal:  Negative for constipation.  Psychiatric/Behavioral:  Positive for depression. Negative for hallucinations, memory loss, substance abuse and suicidal ideas. The patient is nervous/anxious. The patient does not have insomnia.    Blood pressure 102/61, pulse 71, temperature 98.6 F (37 C), temperature source Oral, resp. rate 13, height 5\' 7"  (1.702 m), weight 65.2 kg, SpO2 97%. Body mass index is 22.52 kg/m.  Treatment Plan Summary: Daily contact with patient to assess and evaluate symptoms and progress in treatment and Medication management   ASSESSMENT: Jennifer York is a 32 year old female with a past psychiatric history  of MDD, GAD, recent Schizoaffective disorder (bipolar type) diagnosis, Cannabis-induced psychosis, Polysubstance use disorder (tobacco, alcohol, LSD, mushroom) and 2 prior psychiatric hospitalizations (most recent June 2024) who presented to [facility] voluntarily on 8/7 with complaints of active suicide ideation with means to carry out plan.    8/8: Patient's symptoms are consistent with a tentative diagnosis of bipolar 1 disorder, current episode depressed.  Patient is unable to detail or specify manic symptoms, but does appear to have periods of expansive energy and mood.  She also appears to meet criteria for the diagnosis of GAD, given patient's report of excessive anxiety and worry associated with somatic symptoms. Patient has a history of polysubstance use, but has mostly used cannabis.  Patient does mention a previous diagnosis of schizoaffective disorder, currently do  not think this is the most accurate diagnosis given the onset of psychosis is congruent with the onset of cannabis use.  8/9: Patient appears less depressed compared to yesterday. Paper copy of Y-BOCS given to patient to complete today.  Based on collateral call with patient's boyfriend, it appears she may have had hypomanic episodes in her past.  She continues to report anxiety, and is likely experiencing cannabis withdrawal.  Will continue to increase her Lamictal, also added scheduled BuSpar.   8/10: Continues to report feeling depressed and depersonalization. Discussed that increased lamictal will hopefully help. Discussed decreased concentration and depersonalization may also be symptom of depression. Discussed discontinuing zoloft to decrease likelihood of going into manic episode. Plan to discuss restarting an additional mood stabilizing antipsychotic such as abilify or seroquel.  8/11: Episode of hypotension, suspect related to dehydration. Starting vraylar today. Stopping buspar due to concern for contributing to drowsiness.   8/12: No significant changes reported today.  She continues to endorse fatigue and anxiety.  Plan is to continue titrating Vraylar and Lamictal until remission of symptoms is achieved.  Diagnoses / Active Problems: Bipolar II disorder, current episode depressed Generalized anxiety disorder Cannabis use disorder Cannabis induced psychosis, cannabis induced mania R/o out eating disorder R/o OCD  PLAN: Safety and Monitoring:  -- Voluntary admission to inpatient psychiatric unit for safety, stabilization and treatment  -- Daily contact with patient to assess and evaluate symptoms and progress in treatment  -- Patient's case to be discussed in multi-disciplinary team meeting  -- Observation Level : q15 minute checks  -- Vital signs:  q12 hours  -- Precautions: suicide, elopement, and assault  2. Psychiatric Diagnoses and Treatment:  Increase Lamictal 50 to 75 mg  Q12Hrs starting tonight for mood stabilization with plans to increase over the week Scheduled trazodone 50 mg nightly for insomnia Increase vraylar 1.5 mg to 3.0 mg starting tomorrow for bipolar depression and mood stabilization Risperdal discontinued on admission due to galactorrhea and amenorrhea Prolactin on 08/31/2022 - 43.7  May repeat prolactin level by discharge -- The risks/benefits/side-effects/alternatives to this medication were discussed in detail with the patient and time was given for questions. The patient consents to medication trial.              -- Metabolic profile and EKG monitoring obtained while on an atypical antipsychotic  BMI: 22.52 kg/m TSH: 1.378 on 8/7 Lipid Panel: Cholesterol 205 and LDL 110 on 8/7 HbgA1c: 5.3 on 8/7 QTc: 456 on 8/7:              -- Encouraged patient to participate in unit milieu and in scheduled group therapies   -- Short Term Goals: Ability to identify changes  in lifestyle to reduce recurrence of condition will improve and Ability to verbalize feelings will improve  -- Long Term Goals: Improvement in symptoms so as ready for discharge Other PRNS Maalox/Mylanta, Tylenol, milk of magnesia, agitation protocol (Benadryl/Haldol/Ativan)   3. Medical Issues Being Addressed:   Tobacco Use Disorder  -- Nicotine patch 21mg /24 hours ordered  -- Smoking cessation encouraged  #Hypothyroidism -- TSH WNL on 08/30/2022  4. Discharge Planning:   -- Social work and case management to assist with discharge planning and identification of hospital follow-up needs prior to discharge  -- Estimated LOS: 5-7 days  -- Discharge Concerns: Need to establish a safety plan; Medication compliance and effectiveness  -- Discharge Goals: Return home with outpatient referrals for mental health follow-up including medication management/psychotherapy   I certify that inpatient services furnished can reasonably be expected to improve the patient's condition.   This note was  created using a voice recognition software as a result there may be grammatical errors inadvertently enclosed that do not reflect the nature of this encounter. Every attempt is made to correct such errors.   Dr. Liston Alba, MD PGY-2, Psychiatry Residency  8/13/202411:18 AM

## 2022-09-05 NOTE — Group Note (Signed)
Recreation Therapy Group Note   Group Topic:Animal Assisted Therapy   Group Date: 09/05/2022 Start Time: 1000 End Time: 1030 Facilitators: Sharvi Mooneyhan, Benito Mccreedy, LRT   Animal-Assisted Activity (AAA) Program Checklist/Progress Note Patient Eligibility Criteria Checklist & Daily Group note for Rec Tx Intervention   AAA/T Program Assumption of Risk Form signed by Patient/ or Parent Legal Guardian YES  Patient is free of allergies or severe asthma  YES  Patient reports no fear of animals YES  Patient reports no history of cruelty to animals YES  Patient understands their participation is voluntary YES   Group Description: Patients provided opportunity to interact with trained and credentialed [employer] Therapy dog and the community volunteer/dog handler.    Affect/Mood: N/A   Participation Level: Did not attend    Clinical Observations/Individualized Feedback: Pt did not join group session following invitation to open dayroom for AAA programming.   Benito Mccreedy Karelyn Brisby, LRT, CTRS 09/05/2022 4:23 PM

## 2022-09-05 NOTE — Group Note (Signed)
LCSW Group Therapy Note  Group Date: 09/05/2022 Start Time: 1100 End Time: 1200   Type of Therapy and Topic:  Group Therapy - Healthy vs Unhealthy Coping Skills  Participation Level:  Did Not Attend  Description of Group The focus of this group was to determine what unhealthy coping techniques typically are used by group members and what healthy coping techniques would be helpful in coping with various problems. Patients were guided in becoming aware of the differences between healthy and unhealthy coping techniques. Patients were asked to identify 2-3 healthy coping skills they would like to learn to use more effectively.    Therapeutic Modalities Cognitive Behavioral Therapy Motivational Interviewing  Ane Payment, LCSW 09/05/2022  2:29 PM

## 2022-09-05 NOTE — Plan of Care (Signed)
  Problem: Education: Goal: Knowledge of [facility] Education information/materials will improve Outcome: Progressing   Problem: Education: Goal: Mental status will improve Outcome: Progressing   Problem: Education: Goal: Verbalization of understanding the information provided will improve Outcome: Progressing   Problem: Activity: Goal: Sleeping patterns will improve Outcome: Progressing   Problem: Safety: Goal: Periods of time without injury will increase Outcome: Progressing

## 2022-09-05 NOTE — Progress Notes (Signed)
   09/05/22 0615  15 Minute Checks  Location Bedroom  Visual Appearance Calm  Behavior Sleeping  Sleep (Behavioral Health Patients Only)  Calculate sleep? (Click Yes once per 24 hr at 0600 safety check) Yes  Documented sleep last 24 hours 8

## 2022-09-05 NOTE — [facility] Group Notes (Signed)
Adult Psychoeducational Group Note  Date:  09/05/2022 Time:  9:20 PM  Group Topic/Focus:  Wrap-Up Group:   The focus of this group is to help patients review their daily goal of treatment and discuss progress on daily workbooks.  Participation Level:  Active  Participation Quality:  Appropriate  Affect:  Appropriate  Cognitive:  Appropriate  Insight: Appropriate  Engagement in Group:  Engaged  Modes of Intervention:  Discussion and Support  Additional Comments:  Pt told that today was a good day on the unit, the highlight of which was enjoying Karaoke earlier with her peers. On the subject of staying well upon discharge, Pt mentioned wanting to do more musically therapeutic activities such as playing the banjo. Pt rated her day a 6 out of 10.  Christ Kick 09/05/2022, 9:20 PM

## 2022-09-05 NOTE — Progress Notes (Signed)
   09/05/22 2120  Psych Admission Type (Psych Patients Only)  Admission Status Voluntary  Psychosocial Assessment  Patient Complaints Anxiety;Depression  Eye Contact Fair  Facial Expression Flat  Affect Flat  Speech Logical/coherent  Interaction Assertive  Motor Activity Other (Comment)  Appearance/Hygiene Unremarkable  Behavior Characteristics Cooperative  Mood Depressed;Anxious  Thought Process  Coherency WDL  Content WDL  Delusions None reported or observed  Perception WDL  Hallucination None reported or observed  Judgment WDL  Confusion None  Danger to Self  Current suicidal ideation? Denies  Self-Injurious Behavior No self-injurious ideation or behavior indicators observed or expressed   Agreement Not to Harm Self Yes  Description of Agreement verbal  Danger to Others  Danger to Others None reported or observed

## 2022-09-05 NOTE — Plan of Care (Signed)
  Problem: Education: Goal: Knowledge of [facility] Education information/materials will improve Outcome: Progressing Goal: Emotional status will improve Outcome: Progressing Goal: Mental status will improve Outcome: Progressing Goal: Verbalization of understanding the information provided will improve Outcome: Progressing   Problem: Activity: Goal: Interest or engagement in activities will improve Outcome: Progressing Goal: Sleeping patterns will improve Outcome: Progressing   Problem: Coping: Goal: Ability to verbalize frustrations and anger appropriately will improve Outcome: Progressing

## 2022-09-06 ENCOUNTER — Encounter (HOSPITAL_COMMUNITY): Payer: Self-pay

## 2022-09-06 NOTE — BH IP Treatment Plan (Signed)
Interdisciplinary Treatment and Diagnostic Plan Update  09/06/2022 Time of Session: 1345 Jennifer York MRN: [id number]  Principal Diagnosis: MDD (major depressive disorder), recurrent severe, without psychosis (HCC)  Secondary Diagnoses: Principal Problem:   MDD (major depressive disorder), recurrent severe, without psychosis (HCC)   Current Medications:  Current Facility-Administered Medications  Medication Dose Route Frequency Provider Last Rate Last Admin   acetaminophen (TYLENOL) tablet 650 mg  650 mg Oral Q6H PRN Sunday Corn, NP       alum & mag hydroxide-simeth (MAALOX/MYLANTA) 200-200-20 MG/5ML suspension 30 mL  30 mL Oral Q4H PRN Sunday Corn, NP       busPIRone (BUSPAR) tablet 10 mg  10 mg Oral TID Sarita Bottom, MD   10 mg at 09/06/22 1403   cariprazine (VRAYLAR) capsule 3 mg  3 mg Oral Daily Carrion-Carrero, Karle Starch, MD   3 mg at 09/06/22 8295   diphenhydrAMINE (BENADRYL) capsule 50 mg  50 mg Oral TID PRN Sunday Corn, NP       Or   diphenhydrAMINE (BENADRYL) injection 50 mg  50 mg Intramuscular TID PRN Sunday Corn, NP       haloperidol (HALDOL) tablet 5 mg  5 mg Oral TID PRN Sunday Corn, NP       Or   haloperidol lactate (HALDOL) injection 5 mg  5 mg Intramuscular TID PRN Sunday Corn, NP       hydrOXYzine (ATARAX) tablet 25 mg  25 mg Oral TID PRN Bobbitt, Shalon E, NP   25 mg at 09/04/22 1105   lamoTRIgine (LAMICTAL) tablet 75 mg  75 mg Oral BID Carrion-Carrero, Karle Starch, MD   75 mg at 09/06/22 0852   LORazepam (ATIVAN) tablet 2 mg  2 mg Oral TID PRN Sunday Corn, NP       Or   LORazepam (ATIVAN) injection 2 mg  2 mg Intramuscular TID PRN Sunday Corn, NP       magnesium hydroxide (MILK OF MAGNESIA) suspension 30 mL  30 mL Oral Daily PRN Sunday Corn, NP       nicotine polacrilex (NICORETTE) gum 2 mg  2 mg Oral PRN Abbott Pao, Nadir, MD   2 mg at 09/06/22 1403   traZODone (DESYREL) tablet 50 mg  50 mg Oral QHS Carrion-Carrero,  Margely, MD   50 mg at 09/04/22 2046   PTA Medications: Medications Prior to Admission  Medication Sig Dispense Refill Last Dose   chlorhexidine (PERIDEX) 0.12 % solution Use as directed 15 mLs in the mouth or throat 2 (two) times daily. Rinse for 30 seconds after brushing, then spit.      hydrOXYzine (ATARAX) 25 MG tablet Take 1 tablet (25 mg total) by mouth 3 (three) times daily as needed for anxiety. 30 tablet 0    ibuprofen (ADVIL) 800 MG tablet Take 800 mg by mouth every 6 (six) hours as needed (For pain).      lamoTRIgine (LAMICTAL) 25 MG tablet Take 1 tablet (25 mg total) by mouth every 12 (twelve) hours. 60 tablet 0    levothyroxine (SYNTHROID) 100 MCG tablet Take 100 mcg by mouth daily before breakfast. (Patient not taking: Reported on 08/30/2022)      oxyCODONE-acetaminophen (PERCOCET/ROXICET) 5-325 MG tablet Take 1 tablet by mouth every 6 (six) hours as needed for severe pain. 6 tablet 0    penicillin v potassium (VEETID) 500 MG tablet Take 500 mg by mouth 4 (four) times daily. Take for 14 days starting on 08/21/22.  risperiDONE (RISPERDAL) 2 MG tablet Take 2 mg by mouth at bedtime.       Patient Stressors: Financial difficulties    Patient Strengths: Active sense of humor  Average or above average intelligence  Communication skills  General fund of knowledge  Motivation for treatment/growth  Physical Health  Supportive family/friends   Treatment Modalities: Medication Management, Group therapy, Case management,  1 to 1 session with clinician, Psychoeducation, Recreational therapy.   Physician Treatment Plan for Primary Diagnosis: MDD (major depressive disorder), recurrent severe, without psychosis (HCC) Long Term Goal(s): Improvement in symptoms so as ready for discharge   Short Term Goals: Ability to identify changes in lifestyle to reduce recurrence of condition will improve Ability to verbalize feelings will improve  Medication Management: Evaluate patient's  response, side effects, and tolerance of medication regimen.  Therapeutic Interventions: 1 to 1 sessions, Unit Group sessions and Medication administration.  Evaluation of Outcomes: Progressing  Physician Treatment Plan for Secondary Diagnosis: Principal Problem:   MDD (major depressive disorder), recurrent severe, without psychosis (HCC)  Long Term Goal(s): Improvement in symptoms so as ready for discharge   Short Term Goals: Ability to identify changes in lifestyle to reduce recurrence of condition will improve Ability to verbalize feelings will improve     Medication Management: Evaluate patient's response, side effects, and tolerance of medication regimen.  Therapeutic Interventions: 1 to 1 sessions, Unit Group sessions and Medication administration.  Evaluation of Outcomes: Progressing   RN Treatment Plan for Primary Diagnosis: MDD (major depressive disorder), recurrent severe, without psychosis (HCC) Long Term Goal(s): Knowledge of disease and therapeutic regimen to maintain health will improve  Short Term Goals: Ability to remain free from injury will improve, Ability to verbalize frustration and anger appropriately will improve, Ability to demonstrate self-control, Ability to participate in decision making will improve, Ability to verbalize feelings will improve, Ability to disclose and discuss suicidal ideas, Ability to identify and develop effective coping behaviors will improve, and Compliance with prescribed medications will improve  Medication Management: RN will administer medications as ordered by provider, will assess and evaluate patient's response and provide education to patient for prescribed medication. RN will report any adverse and/or side effects to prescribing provider.  Therapeutic Interventions: 1 on 1 counseling sessions, Psychoeducation, Medication administration, Evaluate responses to treatment, Monitor vital signs and CBGs as ordered, Perform/monitor CIWA,  COWS, AIMS and Fall Risk screenings as ordered, Perform wound care treatments as ordered.  Evaluation of Outcomes: Progressing   LCSW Treatment Plan for Primary Diagnosis: MDD (major depressive disorder), recurrent severe, without psychosis (HCC) Long Term Goal(s): Safe transition to appropriate next level of care at discharge, Engage patient in therapeutic group addressing interpersonal concerns.  Short Term Goals: Engage patient in aftercare planning with referrals and resources, Increase social support, Increase ability to appropriately verbalize feelings, Increase emotional regulation, Facilitate acceptance of mental health diagnosis and concerns, Facilitate patient progression through stages of change regarding substance use diagnoses and concerns, Identify triggers associated with mental health/substance abuse issues, and Increase skills for wellness and recovery  Therapeutic Interventions: Assess for all discharge needs, 1 to 1 time with Social worker, Explore available resources and support systems, Assess for adequacy in community support network, Educate family and significant other(s) on suicide prevention, Complete Psychosocial Assessment, Interpersonal group therapy.  Evaluation of Outcomes: Progressing   Progress in Treatment: Attending groups: Yes. Participating in groups: Yes. Taking medication as prescribed: Yes. Toleration medication: Yes. Family/Significant other contact made: Yes, individual(s) contacted:  [name] [phone]  Patient understands diagnosis: Yes. Discussing patient identified problems/goals with staff: Yes. Medical problems stabilized or resolved: Yes. Denies suicidal/homicidal ideation: Yes. Issues/concerns per patient self-inventory: Yes. Other: N/A  New problem(s) identified: No, Describe:  None Reported  New Short Term/Long Term Goal(s): medication stabilization, elimination of SI thoughts, development of comprehensive mental  wellness plan  Patient Goals:  Coping Skills  Discharge Plan or Barriers: :  Patient recently admitted. CSW will continue to follow and assess for appropriate referrals and possible discharge planning.   Reason for Continuation of Hospitalization: Aggression Anxiety Depression Medication stabilization Suicidal ideation  Estimated Length of Stay: 3-7 Days  Last 3 [place] Suicide Severity Risk Score: Flowsheet Row [facility] (Current[facility] Most recent reading at 08/30/2022  6:00 PM ED from 08/30/2022 in [facility] Most recent reading at 08/30/2022  3:29 PM ED from 07/22/2022 in [facility] Emergency Department at [facility] Most recent reading at 07/22/2022  2:09 PM  C-SSRS RISK CATEGORY Low Risk Low Risk No Risk       Last PHQ 2/9 Scores:     No data to display          medication stabilization, elimination of SI thoughts, development of comprehensive mental wellness plan.   Scribe for Treatment Team: Ane Payment, LCSW 09/06/2022 3:15 PM

## 2022-09-06 NOTE — Progress Notes (Signed)
[facility] MD Progress Note  09/06/2022 11:53 AM Jennifer York  MRN:  [id number]  Principal Problem: MDD (major depressive disorder), recurrent severe, without psychosis (HCC) Diagnosis: Principal Problem:   MDD (major depressive disorder), recurrent severe, without psychosis (HCC)  Reason for Admission:  Jennifer York is a 32 year old female with a past psychiatric history of MDD, GAD, recent Schizoaffective disorder (bipolar type) diagnosis, Cannabis-induced psychosis, Polysubstance use disorder (tobacco, alcohol, LSD, mushroom) and 2 prior psychiatric hospitalizations (most recent June 2024) who presented to [facility] voluntarily on 08/30/2022 with complaints of active suicide ideation with means to carry out plan. Her UDS was positive for THC on admission. (admitted on 08/30/2022, total  LOS: 7 days )  Yesterday, the psychiatry team made following recommendations:  Increase Lamictal 50 to 75 mg Q12Hrs starting tonight for mood stabilization with plans to increase over the week Scheduled trazodone 50 mg nightly for insomnia Increase vraylar 1.5 mg to 3.0 mg starting tomorrow for bipolar depression and mood stabilization Risperdal discontinued on admission due to galactorrhea and amenorrhea Prolactin on 08/31/2022 - 43.7  May repeat prolactin level by discharge  Pertinent information discussed during bed progression: No acute concerns overnight.  Information Obtained Today During Patient Interview:  Patient reports she is doing well today.  Reports disruptions in sleep due to one-on-one sitter present in the room for her roommate.  She reports depression and anxiety have significantly improved.  Has benefited from current medications, has found BuSpar to be helpful in managing her anxiety.  She denies any suicidal ideations as of 248 hours, reports that she is ready to be discharged.  Agrees to contact her boyfriend for collateral to confirm this.  Patient denies homicidal ideation.  Denies  auditory and visual hallucinations.  Denies thought insertion, thought withdrawal, thought broadcasting and ideas of reference.   No side effects to currently prescribed psychiatric medications.  No somatic complaints. Attempted to contact patient's boyfriend, [name] at [phone] for collateral today.  Was sent to voicemail.  Past Psychiatric Hx: Current Psychiatrist: [facility], started a month ago, has had 2 visits, patient is unsure if she would like to continue working with them. Current Therapist: [facility] in [place] Previous Psych Diagnoses: MDD, GAD, Schizoaffective disorder (bipolar type), Tic disorder Current psychiatric medications: Lamictal, risperidone Psychiatric medication history: Wellbutrin, Zoloft, Abilify Prior inpatient treatment: 2 (2017, 06/2022) Current/prior outpatient treatment: risperidone 1mg  daily, lamotrigine 25 mg BID Prior rehab hx: none on chart review Psychotherapy hx: Futures trader with [facility] History of suicide: 2 (2017, 08/2022) History of homicide or aggression: denies Psychiatric medication compliance history: compliant per patient Neuromodulation history: none on chart review   Substance Abuse Hx: Alcohol: started at age 73, drinking daily since age 7 (2 drinks, liquor). "It is hard to go a day without drinking". Denies withdrawal symptoms, including seizures. Tobacco: started at age 15, uses vape constantly Illicit drugs:  Rx drug abuse: denies Rehab hx: not assessed   Past Medical History: PCP: [facility] Dx: Hashimoto's Meds:Levothyroxine (not taking 08/2022) ALL: denies Hosp: denies Surgeries: denies Trauma: not assessed Seizures: denies   LMP: June 2024 Contraceptives: none, has scheduled appointment for Paraguard insertion next week (Aug. 2024)  Past Medical History:  Diagnosis Date   Family history of adverse reaction to anesthesia    father has woken up during surgery   Medical history  non-contributory     Family History: Medical: father has a heart condition Psych: anxiety, depression Psych Rx: none on chart  review SA/HA: father used to use cannabis Substance use family hx: Family History  Problem Relation Age of Onset   Depression Mother    Depression Father    Depression Brother    Social History: Living: in apartment with boyfriend, Thadius. Family (mother, father. Brother) lives 30 min away. 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 Work: Unemployed Finances: Currently depends on boyfriend Marital Status: Single Children: Denies   Abuse: Denies Armed forces operational officer: Denies Hotel manager: Denies  Current Medications: Current Facility-Administered Medications  Medication Dose Route Frequency Provider Last Rate Last Admin   acetaminophen (TYLENOL) tablet 650 mg  650 mg Oral Q6H PRN Sunday Corn, NP       alum & mag hydroxide-simeth (MAALOX/MYLANTA) 200-200-20 MG/5ML suspension 30 mL  30 mL Oral Q4H PRN Sunday Corn, NP       busPIRone (BUSPAR) tablet 10 mg  10 mg Oral TID Sarita Bottom, MD   10 mg at 09/06/22 0853   cariprazine (VRAYLAR) capsule 3 mg  3 mg Oral Daily Carrion-Carrero, Karle Starch, MD   3 mg at 09/06/22 0853   diphenhydrAMINE (BENADRYL) capsule 50 mg  50 mg Oral TID PRN Sunday Corn, NP       Or   diphenhydrAMINE (BENADRYL) injection 50 mg  50 mg Intramuscular TID PRN Sunday Corn, NP       haloperidol (HALDOL) tablet 5 mg  5 mg Oral TID PRN Sunday Corn, NP       Or   haloperidol lactate (HALDOL) injection 5 mg  5 mg Intramuscular TID PRN Sunday Corn, NP       hydrOXYzine (ATARAX) tablet 25 mg  25 mg Oral TID PRN Bobbitt, Shalon E, NP   25 mg at 09/04/22 1105   lamoTRIgine (LAMICTAL) tablet 75 mg  75 mg Oral BID Carrion-Carrero, Karle Starch, MD   75 mg at 09/06/22 0852   LORazepam (ATIVAN) tablet 2 mg  2 mg Oral TID PRN Sunday Corn, NP       Or   LORazepam (ATIVAN)  injection 2 mg  2 mg Intramuscular TID PRN Sunday Corn, NP       magnesium hydroxide (MILK OF MAGNESIA) suspension 30 mL  30 mL Oral Daily PRN Sunday Corn, NP       nicotine polacrilex (NICORETTE) gum 2 mg  2 mg Oral PRN Abbott Pao, Nadir, MD   2 mg at 09/05/22 1648   traZODone (DESYREL) tablet 50 mg  50 mg Oral QHS Carrion-Carrero, Nayleah Gamel, MD   50 mg at 09/04/22 2046    Lab Results:  No results found for this or any previous visit (from the past 48 hour(s)).   Blood Alcohol level:  Lab Results  Component Value Date   ETH <10 08/30/2022   ETH <10 05/18/2022    Metabolic Labs: Lab Results  Component Value Date   HGBA1C 5.3 08/30/2022   MPG 105.41 08/30/2022   MPG 91.06 07/05/2022   Lab Results  Component Value Date   PROLACTIN 43.7 (H) 08/31/2022   PROLACTIN 34.9 (H) 08/30/2022   Lab Results  Component Value Date   CHOL 205 (H) 08/30/2022   TRIG 43 08/30/2022   HDL 86 08/30/2022   CHOLHDL 2.4 08/30/2022   VLDL 9 08/30/2022   LDLCALC 110 (H) 08/30/2022   LDLCALC 67 07/05/2022    Sleep:Sleep: Good   Physical Findings: AIMS: not assesed  Psychiatric Specialty Exam:  Presentation  General Appearance:  Appropriate for Environment; Casual; Fairly Groomed  Eye Contact: Fair  Speech: Clear and Coherent; Normal Rate  Speech Volume: Normal  Handedness: -- (not assessed)   Mood and Affect  Mood: -- ("Much better")  Affect: Appropriate; Full Range; Congruent   Thought Process  Thought Processes: Coherent; Goal Directed; Linear  Descriptions of Associations:Intact  Orientation:Full (Time, Place and Person)  Thought Content:Logical; WDL  History of Schizophrenia/Schizoaffective disorder:No  Duration of Psychotic Symptoms:Less than six months  Hallucinations:Hallucinations: None  Ideas of Reference:None  Suicidal Thoughts:Suicidal Thoughts: No  Homicidal Thoughts:Homicidal Thoughts: No   Sensorium  Memory: Immediate  Fair  Judgment: Fair  Insight: Fair   Executive Functions  Concentration: Good  Attention Span: Good  Recall: Good  Fund of Knowledge: Good  Language: Good   Psychomotor Activity  Psychomotor Activity: Psychomotor Activity: Normal   Assets  Assets: Communication Skills; Desire for Improvement; Resilience   Sleep  Sleep: Sleep: Good    Physical Exam:  Physical Exam Vitals and nursing note reviewed.  Constitutional:      General: She is in acute distress.     Appearance: She is not ill-appearing.  HENT:     Head: Normocephalic and atraumatic.  Pulmonary:     Effort: Pulmonary effort is normal. No respiratory distress.  Musculoskeletal:        General: Normal range of motion.  Skin:    General: Skin is warm and dry.    Review of Systems  Constitutional:  Negative for chills and fever.  Respiratory:  Negative for shortness of breath.   Cardiovascular:  Negative for chest pain and palpitations.  Gastrointestinal:  Negative for constipation.  Psychiatric/Behavioral:  Positive for depression. Negative for hallucinations, memory loss, substance abuse and suicidal ideas. The patient is nervous/anxious. The patient does not have insomnia.    Blood pressure 105/65, pulse 65, temperature 98.5 F (36.9 C), temperature source Oral, resp. rate 13, height 5\' 7"  (1.702 m), weight 65.2 kg, SpO2 98%. Body mass index is 22.52 kg/m.  Treatment Plan Summary: Daily contact with patient to assess and evaluate symptoms and progress in treatment and Medication management   ASSESSMENT:  Jennifer York is a 32 year old female with a past psychiatric history of MDD, GAD, recent Schizoaffective disorder (bipolar type) diagnosis, Cannabis-induced psychosis, Polysubstance use disorder (tobacco, alcohol, LSD, mushroom) and 2 prior psychiatric hospitalizations (most recent June 2024) who presented to [facility] voluntarily on 8/7 with complaints of active suicide  ideation with means to carry out plan.   Patient's psychiatric symptoms of depression and anxiety have significantly improved since admission.  No suicidal ideations reported over 48 hours.  Will continue to plan for discharge, pending confirmation by patient's boyfriend.  Diagnoses / Active Problems: Bipolar II disorder, current episode depressed Generalized anxiety disorder Cannabis use disorder Cannabis induced psychosis, cannabis induced mania R/o out eating disorder R/o OCD  PLAN: Safety and Monitoring:  -- Voluntary admission to inpatient psychiatric unit for safety, stabilization and treatment  -- Daily contact with patient to assess and evaluate symptoms and progress in treatment  -- Patient's case to be discussed in multi-disciplinary team meeting  -- Observation Level : q15 minute checks  -- Vital signs:  q12 hours  -- Precautions: suicide, elopement, and assault  2. Psychiatric Diagnoses and Treatment:  Starting Lamictal 75 mg Q12Hrs today for mood stabilization  Scheduled trazodone 50 mg nightly for insomnia Starting vraylar 3.0 mg today for bipolar depression and mood stabilization Risperdal discontinued on admission due to  galactorrhea and amenorrhea Prolactin on 08/31/2022 - 43.7  May repeat prolactin level by discharge -- The risks/benefits/side-effects/alternatives to this medication were discussed in detail with the patient and time was given for questions. The patient consents to medication trial.              -- Metabolic profile and EKG monitoring obtained while on an atypical antipsychotic  BMI: 22.52 kg/m TSH: 1.378 on 8/7 Lipid Panel: Cholesterol 205 and LDL 110 on 8/7 HbgA1c: 5.3 on 8/7 QTc: 456 on 8/7:              -- Encouraged patient to participate in unit milieu and in scheduled group therapies   -- Short Term Goals: Ability to identify changes in lifestyle to reduce recurrence of condition will improve and Ability to verbalize feelings will  improve  -- Long Term Goals: Improvement in symptoms so as ready for discharge Other PRNS Maalox/Mylanta, Tylenol, milk of magnesia, agitation protocol (Benadryl/Haldol/Ativan)   3. Medical Issues Being Addressed:   Tobacco Use Disorder  -- Nicotine patch 21mg /24 hours ordered  -- Smoking cessation encouraged  #Hypothyroidism -- TSH WNL on 08/30/2022  4. Discharge Planning:   -- Social work and case management to assist with discharge planning and identification of hospital follow-up needs prior to discharge  -- Estimated LOS: 8/15-8/16  -- Discharge Concerns: Need to establish a safety plan; Medication compliance and effectiveness  -- Discharge Goals: Return home with outpatient referrals for mental health follow-up including medication management/psychotherapy   I certify that inpatient services furnished can reasonably be expected to improve the patient's condition.   This note was created using a voice recognition software as a result there may be grammatical errors inadvertently enclosed that do not reflect the nature of this encounter. Every attempt is made to correct such errors.   Dr. Liston Alba, MD PGY-2, Psychiatry Residency  8/14/202411:53 AM

## 2022-09-06 NOTE — Plan of Care (Signed)
  Problem: Education: Goal: Emotional status will improve Outcome: Progressing Goal: Mental status will improve Outcome: Progressing Goal: Verbalization of understanding the information provided will improve Outcome: Progressing   Problem: Activity: Goal: Interest or engagement in activities will improve Outcome: Progressing Goal: Sleeping patterns will improve Outcome: Progressing   Problem: Coping: Goal: Ability to verbalize frustrations and anger appropriately will improve Outcome: Progressing Goal: Ability to demonstrate self-control will improve Outcome: Progressing   Problem: Health Behavior/Discharge Planning: Goal: Identification of resources available to assist in meeting health care needs will improve Outcome: Progressing   

## 2022-09-06 NOTE — Progress Notes (Signed)
Patient refused  scheduled Trazodone.

## 2022-09-06 NOTE — [facility] Group Notes (Signed)
Adult Psychoeducational Group Note  Date:  09/06/2022 Time:  10:13 PM  Group Topic/Focus:  Wrap-Up Group:   The focus of this group is to help patients review their daily goal of treatment and discuss progress on daily workbooks.  Participation Level:  Active  Participation Quality:  Attentive  Affect:  Appropriate  Cognitive:  Alert  Insight: Improving  Engagement in Group:  Engaged  Modes of Intervention:  Discussion  Additional Comments:  Pt attended and participated in group.  Maura Crandall Cassandra 09/06/2022, 10:13 PM

## 2022-09-06 NOTE — Progress Notes (Signed)
   09/06/22 2040  Psych Admission Type (Psych Patients Only)  Admission Status Voluntary  Psychosocial Assessment  Patient Complaints Anxiety;Depression  Eye Contact Fair  Facial Expression Flat  Affect Flat  Speech Logical/coherent  Interaction Assertive  Motor Activity Slow  Appearance/Hygiene Unremarkable  Behavior Characteristics Cooperative;Appropriate to situation  Mood Depressed;Anxious  Thought Process  Coherency WDL  Content WDL  Delusions None reported or observed  Perception WDL  Hallucination None reported or observed  Judgment WDL  Confusion None  Danger to Self  Current suicidal ideation? Denies  Self-Injurious Behavior No self-injurious ideation or behavior indicators observed or expressed   Agreement Not to Harm Self Yes  Description of Agreement Verbal  Danger to Others  Danger to Others None reported or observed

## 2022-09-06 NOTE — Progress Notes (Addendum)
Patient denies SI, HI and AVH. Patient rates anxiety 2/10 and depression 3/10. Patient stated she felt "less drowsy" this morning. This afternoon she stated she felt "fatigued and dissociated." Support encouragement and education on medication adjustments provided. Patient was agreeable to continuing to monitor symptoms. Patient remains safe on the unit Q 15 minute safety checks ongoing.   09/06/22 0900  Psych Admission Type (Psych Patients Only)  Admission Status Voluntary  Psychosocial Assessment  Patient Complaints Anxiety;Depression;Anhedonia  Eye Contact Fair  Facial Expression Flat  Affect Flat  Speech Logical/coherent  Interaction Assertive  Motor Activity Other (Comment) (wnl)  Appearance/Hygiene Unremarkable  Behavior Characteristics Cooperative  Mood Depressed;Anxious  Thought Process  Coherency WDL  Content WDL  Delusions None reported or observed  Perception WDL  Hallucination None reported or observed  Judgment WDL  Confusion None  Danger to Self  Current suicidal ideation? Denies  Self-Injurious Behavior No self-injurious ideation or behavior indicators observed or expressed   Agreement Not to Harm Self Yes  Description of Agreement verbal  Danger to Others  Danger to Others None reported or observed

## 2022-09-06 NOTE — Plan of Care (Signed)
  Problem: Education: Goal: Knowledge of [facility] Education information/materials will improve Outcome: Progressing   Problem: Education: Goal: Emotional status will improve Outcome: Progressing   Problem: Activity: Goal: Interest or engagement in activities will improve Outcome: Progressing   Problem: Activity: Goal: Sleeping patterns will improve Outcome: Progressing   Problem: Safety: Goal: Periods of time without injury will increase Outcome: Progressing

## 2022-09-07 MED ORDER — LAMOTRIGINE 25 MG PO TABS: 75.0000 mg | ORAL_TABLET | Freq: Two times a day (BID) | ORAL | 0 refills | Status: AC

## 2022-09-07 MED ORDER — BUSPIRONE HCL 10 MG PO TABS: 10.0000 mg | ORAL_TABLET | Freq: Three times a day (TID) | ORAL | 0 refills | Status: AC

## 2022-09-07 MED ORDER — HYDROXYZINE HCL 25 MG PO TABS: 25.0000 mg | ORAL_TABLET | Freq: Every day | ORAL | 0 refills | Status: AC | PRN

## 2022-09-07 MED ORDER — CARIPRAZINE HCL 3 MG PO CAPS: 3.0000 mg | ORAL_CAPSULE | Freq: Every day | ORAL | 0 refills | Status: AC

## 2022-09-07 MED ORDER — NICOTINE POLACRILEX 2 MG MT GUM: 2.0000 mg | CHEWING_GUM | OROMUCOSAL | 0 refills | Status: AC | PRN

## 2022-09-07 NOTE — Plan of Care (Signed)
  Problem: Education: Goal: Knowledge of [facility] Education information/materials will improve Outcome: Progressing Goal: Emotional status will improve Outcome: Progressing Goal: Mental status will improve Outcome: Progressing Goal: Verbalization of understanding the information provided will improve Outcome: Progressing   

## 2022-09-07 NOTE — Plan of Care (Signed)
  Problem: Education: Goal: Knowledge of [facility] Education information/materials will improve Outcome: Progressing   Problem: Education: Goal: Emotional status will improve Outcome: Progressing   Problem: Education: Goal: Verbalization of understanding the information provided will improve Outcome: Progressing   Problem: Activity: Goal: Interest or engagement in activities will improve Outcome: Progressing   Problem: Coping: Goal: Ability to verbalize frustrations and anger appropriately will improve Outcome: Progressing   Problem: Safety: Goal: Periods of time without injury will increase Outcome: Progressing

## 2022-09-07 NOTE — Group Note (Signed)
Date:  09/07/2022 Time:  9:59 AM  Group Topic/Focus:  Goals Group:   The focus of this group is to help patients establish daily goals to achieve during treatment and discuss how the patient can incorporate goal setting into their daily lives to aide in recovery.    Participation Level:  Did Not Attend  Participation Quality:   Affect:      Cognitive:      Insight: None  Engagement in Group:  Modes of Intervention:      Additional Comments:     Reymundo Poll 09/07/2022, 9:59 AM

## 2022-09-07 NOTE — Progress Notes (Signed)
  [facility] Adult Case Management Discharge Plan :  Will you be returning to the same living situation after discharge:  Yes,  home with partner At discharge, do you have transportation home?: Yes,  partner  Do you have the ability to pay for your medications: Yes,  insurance  Release of information consent forms completed and in the chart;  Patient's signature needed at discharge.  Patient to Follow up at:  Follow-up Information     [facility] Follow up.   Why: Please call to schedule an appointment with your provider Kerry Fort, as we have been unable to contact at either phone number. Contact information: [address] [place], [place] [address] Phone: [phone] or [address] [place], [place] [address]        [facility]. Go to.   Specialty: Behavioral Health Why: Please go to this provider for medication management services on Monday through Friday, arrive at 7:00 am for same day services. Contact information: [address] [place] [address] [phone]                Next level of care provider has access to [facility] Link:yes  Safety Planning and Suicide Prevention discussed: Yes,  [name] partner-[phone]     Has patient been referred to the Quitline?: Patient refused  Patient has been referred for addiction treatment: Patient refused referral for treatment.  Starleen Arms, LCSW 09/07/2022, 2:20 PM

## 2022-09-07 NOTE — BHH Suicide Risk Assessment (Cosign Needed Addendum)
Suicide Risk Assessment  Discharge Assessment    [facility] Discharge Suicide Risk Assessment   Principal Problem: MDD (major depressive disorder), recurrent severe, without psychosis (HCC) Discharge Diagnoses: Principal Problem:   MDD (major depressive disorder), recurrent severe, without psychosis (HCC)   Total Time spent with patient: 15 minutes  Subjective Data:   Jennifer York Mccalister is a 32 year old female with a past psychiatric history of MDD, GAD, recent Schizoaffective disorder (bipolar type) diagnosis, Cannabis-induced psychosis, Polysubstance use disorder (tobacco, alcohol, LSD, mushroom) and 2 prior psychiatric hospitalizations (most recent June 2024) who presented to [facility] voluntarily on 08/30/2022 with complaints of active suicide ideation with means to carry out plan. Her UDS is positive for THC on admission.    Patient gave [place] permission to speak to her boyfriend, [name] [phone], for collateral on 09/01/2022: [name] reports that he has been dating the patient for 5 years. He shared that he spoke to the patient the night before and she sounded calm and less depressed than earlier in her admission.  Confirms patient's psychotic episode from February to June, confirms patient had increased the frequency of her cannabis use a month prior to the onset of her symptoms.  During this period of psychosis, he reports the patient had increased energy, decreased need for sleep and diminished appetite.  He described her delusional content as fantastical thinking, also alludes to some grandiosity as patient attempted to teach/correct patient on mathematical topics (for context boyfriend is an Art gallery manager, patient has no background in this field).  Since patient's last discharge from [facility] in June, he has not noted significant psychosis, but notes the patient has admitted to feeling like she has to "re-do her thoughts every morning".    [name] is able to recall that prior to patient's episode  of psychosis this year, she has exhibited periods of expansive energy and mood, with goal-directed activity, that last for a maximum of 4 days without significant disruption in her social and occupational functioning.  Information Obtained On Day of Discharge: Jennifer York states that she did not sleep very well and attributes this to being in a hospital environment. She declined trazadone last night and says that she did this because the prior two nights, it did not help her sleep. Today, she feels well but notes that one ofher goals upon admission was to improve her motivation for tasks such as "showering daily, brushing teeth, reading more and watching movies." The patient is still able to do these things, but has a low level of initiative to do them. She states that she likes her recently-established tele-health therapist with whom she is able to discuss these motivational factors. Jennifer York says that her appetite is well and that she has not been restricting her intake. She denies thoughts of harming herself or committing suicide, nor does she have thoughts of harming others. Jennifer York states she has not had thoughts of suicide in about 3-4 days. The patient denies auditory or visual hallucinations. When asked to elaborate on the reported "dissociation" that she informed staff about, the patient says that it is something that she has experienced for almost all of her life and that she experiences it daily. The patient describes "sinking into my thoughts" and "losing focus" for a period while feeling distant to the environment around her. When prompted about derealization or depersonalization she states that she and her environment feel real and palpable, but that she is lost in thought and feels specifically isolated from the environment around her.  Jennifer York is not able to describe the timing or duration of these episodes and is not able to describe what she is thinking about during the episode and says "I'm not sure, I'm  thinking so many things." She is not able to identify a trigger or specific concern/thought during episodes. Jennifer York states that in the past she has felt that she is watching herself and the world around her from the 3rd person, but that this has not happened recently or during this hospitalization. The patient asks if this could be more along the lines of attention problem, such as ADHD. Jennifer York is comfortable with [place] calling James/Thadeus today. She feels safe with Jennifer York, would go to him if she felt thoughts of self harm, suicidal ideation, or homicidal ideation, and does not have access to weapons. The patient asks about trazadone and hydroxyzine for discharge as well as a refill of her medications as they run out after discharge.   Pertinent information discussed during bed progression: Patient declined trazodone. Described dissociation yesterday. We are set to discharge today if she is still doing well and [name] (partner) is comfortable.  Past Psychiatric Hx: Current Psychiatrist: [facility], started a month ago, has had 2 visits, patient is unsure if she would like to continue working with them. Current Therapist: [facility] in [place] Previous Psych Diagnoses: MDD, GAD, Schizoaffective disorder (bipolar type), Tic disorder Current psychiatric medications: Lamictal, risperidone Psychiatric medication history: Wellbutrin, Zoloft, Abilify Prior inpatient treatment: 2 (2017, 06/2022) Current/prior outpatient treatment: risperidone 1mg  daily, lamotrigine 25 mg BID Prior rehab hx: none on chart review Psychotherapy hx: Futures trader with [facility] History of suicide: 2 (2017, 08/2022) History of homicide or aggression: denies Psychiatric medication compliance history: compliant per patient Neuromodulation history: none on chart review   Substance Abuse Hx: Alcohol: started at age 71, drinking daily since age 69 (2 drinks, liquor). "It is hard to go a day without drinking". Denies  withdrawal symptoms, including seizures. Tobacco: started at age 78, uses vape constantly Illicit drugs:  Rx drug abuse: denies Rehab hx: not assessed   Past Medical History: PCP: [facility] Dx: Hashimoto's Meds:Levothyroxine (not taking 08/2022) ALL: denies Hosp: denies Surgeries: denies Trauma: not assessed Seizures: denies   LMP: June 2024 Contraceptives: none, has scheduled appointment for Paraguard insertion next week (Aug. 2024)   Family History: Medical: father has a heart condition Psych: anxiety, depression Psych Rx: none on chart review SA/HA: father used to use cannabis Substance use family hx:   Social History: Living: in apartment with boyfriend, Thadius. Family (mother, father. Brother) lives 30 min away. 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  Psychiatric diagnoses provided upon initial assessment:  Tentative diagnosis of bipolar 1 disorder, current episode depressed (given on admission) Bipolar II disorder, current episode depressed (modified after speaking with patient's partner) Generalized anxiety disorder Rule out OCD Cannabis use disorder Cannabis induced psychosis Rule out eating disorder  Patient's psychiatric medications were adjusted on admission:  Discontinue Risperdal Order repeat prolactin for today, may repeat by discharge Increase Lamictal 25 mg twice daily to Lamictal 25 mg in the morning + Lamictal 50 mg nightly, for mood stabilization Start Zoloft 50 mg daily, per patient's request for management of her depression Continue  to assess for symptoms of mania while on SSRI Start  trazodone 50 mg nightly as needed for insomnia  During the hospitalization, other adjustments were made to the patient's psychiatric medication regimen:  Continue Lamictal 75 mg Q12Hrs today for mood stabilization  Scheduled trazodone 50 mg nightly for insomnia, which patient declined for the past 72 hours prior to discharge Continue vraylar 3.0 mg today for bipolar depression and mood stabilization Risperdal discontinued on admission due to galactorrhea and amenorrhea Prolactin on 08/31/2022 - 43.7  Patient's care was discussed during the interdisciplinary team meeting every day during the hospitalization.  The patient denies having side effects to prescribed psychiatric medication.  Gradually, patient started adjusting to milieu. The patient was evaluated each day by a clinical provider to ascertain response to treatment. Improvement was noted by the patient's report of decreasing symptoms, improved sleep and appetite, affect, medication tolerance, behavior, and participation in unit programming.  Patient was asked each day to complete a self inventory noting mood, mental status, pain, new symptoms, anxiety and concerns.    Symptoms were reported as significantly decreased or resolved completely by discharge.   On day of discharge, the patient reports that their mood is stable. The patient denied having suicidal thoughts for more than 48 hours prior to discharge.  Patient denies having homicidal thoughts.  Patient denies having auditory hallucinations.  Patient denies any visual hallucinations or other symptoms of psychosis. The patient was motivated to continue taking medication with a goal of continued improvement in mental health.   The patient reports their target psychiatric symptoms of depression and anxiety responded well to the psychiatric medications, and the patient reports overall benefit other psychiatric hospitalization. Supportive psychotherapy was provided to the patient. The  patient also participated in regular group therapy while hospitalized. Coping skills, problem solving as well as relaxation therapies were also part of the unit programming.  Labs were reviewed with the patient, and abnormal results were discussed with the patient.  The patient is able to verbalize their individual safety plan to this provider.  # It is recommended to the patient to continue psychiatric medications as prescribed, after discharge from the hospital.    # It is recommended to the patient to follow up with your outpatient psychiatric provider and PCP.  # It was discussed with the patient, the impact of alcohol, drugs, tobacco have been there overall psychiatric and medical wellbeing, and total abstinence from substance use was recommended the patient.ed.  # Prescriptions provided or sent directly to preferred pharmacy at discharge. Patient agreeable to plan. Given opportunity to ask questions. Appears to feel comfortable with discharge.    # In the event of worsening symptoms, the patient is instructed to call the crisis hotline, 911 and or go to the nearest ED for appropriate evaluation and treatment of symptoms. To follow-up with primary care provider for other medical issues, concerns and or health care needs  # Patient was discharged Home with a plan to follow up as noted below.    Musculoskeletal: Strength & Muscle Tone: within normal limits Gait & Station: normal Patient leans: N/A  Psychiatric Specialty Exam:  Presentation  General Appearance:  Appropriate for Environment; Casual; Fairly Groomed  Eye Contact: Good  Speech: Clear and Coherent; Normal Rate (extensively verbose vocabulary)  Speech Volume: Normal  Handedness: -- (not assessed)   Mood and Affect  Mood: -- (feeling good)  Affect: Appropriate; Congruent   Thought Process  Thought Processes: Coherent; Goal Directed; Linear  Descriptions of  Associations:Intact  Orientation:Full  (Time, Place and Person)  Thought Content:Logical  History of Schizophrenia/Schizoaffective disorder:No  Duration of Psychotic Symptoms:Less than six months  Hallucinations:Hallucinations: None  Ideas of Reference:None  Suicidal Thoughts:Suicidal Thoughts: No  Homicidal Thoughts:Homicidal Thoughts: No   Sensorium  Memory: Immediate Good; Recent Good; Remote Good  Judgment: Good  Insight: Good   Executive Functions  Concentration: Good  Attention Span: Good  Recall: Good  Fund of Knowledge: Good  Language: Good   Psychomotor Activity  Psychomotor Activity: Psychomotor Activity: Normal   Assets  Assets: Communication Skills; Desire for Improvement; Social Support; Housing; Physical Health; Intimacy; Transportation; Vocational/Educational   Sleep  Sleep: Sleep: Poor    Physical Exam: Physical Exam Constitutional:      General: She is not in acute distress.    Appearance: Normal appearance. She is not ill-appearing.  HENT:     Head: Normocephalic and atraumatic.  Pulmonary:     Effort: Pulmonary effort is normal. No respiratory distress.  Skin:    General: Skin is warm and dry.  Neurological:     General: No focal deficit present.     Mental Status: She is alert.    Review of Systems  Constitutional:  Negative for chills and fever.  Respiratory:  Negative for shortness of breath.   Cardiovascular:  Negative for chest pain.  Neurological:  Negative for dizziness and headaches.  Psychiatric/Behavioral:  Negative for depression, hallucinations, memory loss, substance abuse and suicidal ideas. The patient is not nervous/anxious and does not have insomnia.    Blood pressure (!) 105/58, pulse 63, temperature 98.5 F (36.9 C), temperature source Oral, resp. rate 16, height 5\' 7"  (1.702 m), weight 65.2 kg, SpO2 97%. Body mass index is 22.52 kg/m.  Mental Status Per Nursing Assessment::   On Admission:  Suicidal ideation indicated by  patient  Demographic factors:  Caucasian, Low socioeconomic status Current Mental Status:  Suicidal ideation indicated by patient Loss Factors:  Financial problems / change in socioeconomic status Historical Factors:  Prior suicide attempts Risk Reduction Factors:  Positive social support   Continued Clinical Symptoms:  More than one psychiatric diagnosis Previous Psychiatric Diagnoses and Treatments  Cognitive Features That Contribute To Risk:  None    Suicide Risk:  Mild: There are no identifiable suicide plans, no associated intent, mild dysphoria and related symptoms, good self-control (both objective and subjective assessment), few other risk factors, and identifiable protective factors, including available and accessible social support.    Follow-up Information     [facility] Follow up.   Why: Please call to schedule an appointment with your provider Kerry Fort, as we have been unable to contact at either phone number. Contact information: [address] [place], [place] [address] Phone: [phone] or [address] [place], [place] [address]        [facility]. Go to.   Specialty: Behavioral Health Why: Please go to this provider for medication management services on Monday through Friday, arrive at 7:00 am for same day services. Contact information: [address] [place] [address] [phone]                Plan Of Care/Follow-up recommendations:  Activity: as tolerated  Diet: heart healthy  Other: -Follow-up with your outpatient psychiatric provider -instructions on appointment date, time, and address (location) are provided to you in discharge paperwork.  -Take your psychiatric medications as prescribed at discharge - instructions are provided to you in the discharge  paperwork  -Follow-up with outpatient primary care doctor and other specialists -for management of preventative medicine and  chronic medical disease, including:  Hashimoto's thyroiditis, TSH: 1.378 on 08/30/2022  -Testing: Follow-up with outpatient provider for abnormal lab results:  Prolactin   -Recommend abstinence from alcohol, tobacco, and other illicit drug use at discharge.   -If your psychiatric symptoms recur, worsen, or if you have side effects to your psychiatric medications, call your outpatient psychiatric provider, 911, [phone] or go to the nearest emergency department.  -If suicidal thoughts recur, call your outpatient psychiatric provider, 911, [phone] or go to the nearest emergency department.     Lorri Frederick, MD 09/07/2022, 12:12 PM

## 2022-09-07 NOTE — Progress Notes (Signed)
Jennifer York requested to meet with chaplain following the grief and loss group. She shared that she has struggled with depression and not judging herself for the lack of energy that she has to care for herself. She feels that she is still healing from the psychotic episode that she had recently and is still processing how some of her behaviors during that 4 month period of psychosis impacted her relationships, particularly two important friendships.  Chaplain encouraged her to learn as much as she can about her diagnosis to help with some of the shame that she is experiencing from that time.  She is also struggling with her faith after her psychosis.  She felt such a sense of strong connection to God during that time, but because of religiously themed delusions that she had, she is afraid to connect with God.  Chaplain acknowledged the fear and provided space for her to talk about her experience.  Chaplain encouraged her seek out God in ways that previously helped her feel connected as well as new ways that might feel meaningful.   [address], Bcc Pager, [phone]

## 2022-09-07 NOTE — Discharge Summary (Addendum)
Physician Discharge Summary Note  Patient:  Jennifer York is an 32 y.o., female MRN:  [id number] DOB:  1990/09/27 Patient phone:  [phone] (home)  Patient address:   [address] [place] [address],  Total Time spent with patient: 15 minutes  Date of Admission:  08/30/2022 Date of Discharge: 09/07/22   Reason for Admission:    Jennifer York is a 32 year old female with a past psychiatric history of MDD, GAD, recent Schizoaffective disorder (bipolar type) diagnosis, Cannabis-induced psychosis, Polysubstance use disorder (tobacco, alcohol, LSD, mushroom) and 2 prior psychiatric hospitalizations (most recent June 2024) who presented to [facility] voluntarily on 08/30/2022 with complaints of active suicide ideation with means to carry out plan. Her UDS is positive for THC on admission.    Patient gave [place] permission to speak to her boyfriend, [name] [phone], for collateral on 09/01/2022: [name] reports that he has been dating the patient for 5 years. He shared that he spoke to the patient the night before and she sounded calm and less depressed than earlier in her admission.  Confirms patient's psychotic episode from February to June, confirms patient had increased the frequency of her cannabis use a month prior to the onset of her symptoms.  During this period of psychosis, he reports the patient had increased energy, decreased need for sleep and diminished appetite.  He described her delusional content as fantastical thinking, also alludes to some grandiosity as patient attempted to teach/correct patient on mathematical topics (for context boyfriend is an Art gallery manager, patient has no background in this field).  Since patient's last discharge from [facility] in June, he has not noted significant psychosis, but notes the patient has admitted to feeling like she has to "re-do her thoughts every morning".    [name] is able to recall that prior to patient's episode of  psychosis this year, she has exhibited periods of expansive energy and mood, with goal-directed activity, that last for a maximum of 4 days without significant disruption in her social and occupational functioning.  Information Obtained On Day of Discharge: Jennifer states that she did not sleep very well and attributes this to being in a hospital environment. She declined trazadone last night and says that she did this because the prior two nights, it did not help her sleep. Today, she feels well but notes that one ofher goals upon admission was to improve her motivation for tasks such as "showering daily, brushing teeth, reading more and watching movies." The patient is still able to do these things, but has a low level of initiative to do them. She states that she likes her recently-established tele-health therapist with whom she is able to discuss these motivational factors. Jennifer says that her appetite is well and that she has not been restricting her intake. She denies thoughts of harming herself or committing suicide, nor does she have thoughts of harming others. Jennifer states she has not had thoughts of suicide in about 3-4 days. The patient denies auditory or visual hallucinations. When asked to elaborate on the reported "dissociation" that she informed staff about, the patient says that it is something that she has experienced for almost all of her life and that she experiences it daily. The patient describes "sinking into my thoughts" and "losing focus" for a period while feeling distant to the environment around her. When prompted about derealization or depersonalization she states that she and her environment feel real and palpable, but that she is lost in thought  and feels specifically isolated from the environment around her. Jennifer is not able to describe the timing or duration of these episodes and is not able to describe what she is thinking about during the episode and says "I'm not sure, I'm  thinking so many things." She is not able to identify a trigger or specific concern/thought during episodes. Jennifer states that in the past she has felt that she is watching herself and the world around her from the 3rd person, but that this has not happened recently or during this hospitalization. The patient asks if this could be more along the lines of attention problem, such as ADHD. Jennifer is comfortable with [place] calling James/Thadeus today. She feels safe with Jennifer, would go to him if she felt thoughts of self harm, suicidal ideation, or homicidal ideation, and does not have access to weapons. The patient asks about trazadone and hydroxyzine for discharge as well as a refill of her medications as they run out after discharge.   [name] MS3, [facility] 8/15/202411:03 AM  I personally was present and performed or re-performed the history, physical exam and medical decision-making activities of this service and have verified that the service and findings are accurately documented in the student's note.  Dr. Liston Alba, MD PGY-2, Psychiatry Residency   Pertinent information discussed during bed progression: Patient declined trazodone. Described dissociation yesterday. We are set to discharge today if she is still doing well and [name] (partner) is comfortable.   Past Psychiatric Hx: Current Psychiatrist: [facility], started a month ago, has had 2 visits, patient is unsure if she would like to continue working with them. Current Therapist: [facility] in [place] Previous Psych Diagnoses: MDD, GAD, Schizoaffective disorder (bipolar type), Tic disorder Current psychiatric medications: Lamictal, risperidone Psychiatric medication history: Wellbutrin, Zoloft, Abilify Prior inpatient treatment: 2 (2017, 06/2022) Current/prior outpatient treatment: risperidone 1mg  daily, lamotrigine 25 mg BID Prior rehab hx: none on chart review Psychotherapy hx: Futures trader with [facility] History of suicide: 2 (2017, 08/2022) History of homicide or aggression: denies Psychiatric medication compliance history: compliant per patient Neuromodulation history: none on chart review   Substance Abuse Hx: Alcohol: started at age 32, drinking daily since age 21 (2 drinks, liquor). "It is hard to go a day without drinking". Denies withdrawal symptoms, including seizures. Tobacco: started at age 35, uses vape constantly Illicit drugs:  Rx drug abuse: denies Rehab hx: not assessed   Past Medical History: PCP: [facility] Dx: Hashimoto's Meds:Levothyroxine (not taking 08/2022) ALL: denies Hosp: denies Surgeries: denies Trauma: not assessed Seizures: denies   LMP: June 2024 Contraceptives: none, has scheduled appointment for Paraguard insertion next week (Aug. 2024)   Family History: Medical: father has a heart condition Psych: anxiety, depression Psych Rx: none on chart review SA/HA: father used to use cannabis Substance use family hx:   Social History: Living: in apartment with boyfriend, Thadius. Family (mother, father. Brother) lives 30 min away. 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 Work: Unemployed Finances: Currently depends on boyfriend Marital Status: Single Children: Denies   Abuse: Denies Armed forces operational officer: Denies Hotel manager: Denies  Principal Problem: MDD (major depressive disorder), recurrent severe, without psychosis (HCC) Discharge Diagnoses: Principal Problem:   MDD (major depressive disorder), recurrent severe, without psychosis (HCC)  Past Medical History:  Past Medical History:  Diagnosis Date   Family history of adverse reaction to anesthesia    father has woken up during surgery  Medical history non-contributory     Past Surgical History:  Procedure Laterality Date   DILATION AND EVACUATION N/A 12/24/2019   Procedure: SUCTION DILATION AND EVACUATION;  Surgeon: Reva Bores, MD;  Location: [facility];  Service: Gynecology;  Laterality: N/A;   Family History:  Family History  Problem Relation Age of Onset   Depression Mother    Depression Father    Depression Brother    Social History:  Social History   Substance and Sexual Activity  Alcohol Use Yes   Comment: drinks 'occasionally'     Social History   Substance and Sexual Activity  Drug Use Yes   Types: Marijuana   Comment: smokes weed 'occasionally'    Social History   Socioeconomic History   Marital status: Single    Spouse name: Not on file   Number of children: Not on file   Years of education: Not on file   Highest education level: Not on file  Occupational History   Not on file  Tobacco Use   Smoking status: Every Day    Current packs/day: 1.00    Types: Cigarettes   Smokeless tobacco: Current    Types: Chew  Vaping Use   Vaping status: Every Day   Substances: Nicotine  Substance and Sexual Activity   Alcohol use: Yes    Comment: drinks 'occasionally'   Drug use: Yes    Types: Marijuana    Comment: smokes weed 'occasionally'   Sexual activity: Yes    Birth control/protection: None  Other Topics Concern   Not on file  Social History Narrative   Not on file   Social Determinants of Health   Financial Resource Strain: Not at Risk (01/05/2022)   Received from [place], [employer]    Financial Resource Strain: 1  Food Insecurity: No Food Insecurity (08/30/2022)   Hunger Vital Sign    Worried About Running Out of Food in the Last Year: Never true    Ran Out of Food in the Last Year: Never true  Transportation Needs: No Transportation Needs (08/30/2022)   PRAPARE - Administrator, Civil Service (Medical): No    Lack of Transportation (Non-Medical): No  Physical Activity: Not at Risk (01/05/2022)   Received from [place], [place]   Physical Activity    Physical Activity: 1  Stress: Not at Risk (01/05/2022)   Received from  [place], [place]   Stress    Stress: 1  Social Connections: Not at Risk (01/05/2022)   Received from [place], [place]   Social Connections    Social Connections and Isolation: 1    Hospital Course:   During the patient's hospitalization, patient had extensive initial psychiatric evaluation, and follow-up psychiatric evaluations every day.   Psychiatric diagnoses provided upon initial assessment:  Tentative diagnosis of bipolar 1 disorder, current episode depressed (given on admission) Bipolar II disorder, current episode depressed (modified after speaking with patient's partner) Generalized anxiety disorder Rule out OCD Cannabis use disorder Cannabis induced psychosis Rule out eating disorder   Patient's psychiatric medications were adjusted on admission:  Discontinue Risperdal Order repeat prolactin for today, may repeat by discharge Increase Lamictal 25 mg twice daily to Lamictal 25 mg in the morning + Lamictal 50 mg nightly, for mood stabilization Start Zoloft 50 mg daily, per patient's request for management of her depression Continue to assess for symptoms of mania while on SSRI Start trazodone 50 mg nightly as needed for insomnia  During the hospitalization, other adjustments were made to the patient's psychiatric medication regimen:  Continue Lamictal 75 mg Q12Hrs today for mood stabilization  Scheduled trazodone 50 mg nightly for insomnia, which patient declined for the past 72 hours prior to discharge Continue vraylar 3.0 mg today for bipolar depression and mood stabilization Risperdal discontinued on admission due to galactorrhea and amenorrhea Prolactin on 08/31/2022 - 43.7   Patient's care was discussed during the interdisciplinary team meeting every day during the hospitalization.   The patient denies having side effects to prescribed psychiatric medication.   Gradually, patient started adjusting to milieu. The patient was evaluated each day by a clinical provider to  ascertain response to treatment. Improvement was noted by the patient's report of decreasing symptoms, improved sleep and appetite, affect, medication tolerance, behavior, and participation in unit programming.  Patient was asked each day to complete a self inventory noting mood, mental status, pain, new symptoms, anxiety and concerns.     Symptoms were reported as significantly decreased or resolved completely by discharge.    On day of discharge, the patient reports that their mood is stable. The patient denied having suicidal thoughts for more than 48 hours prior to discharge.  Patient denies having homicidal thoughts.  Patient denies having auditory hallucinations.  Patient denies any visual hallucinations or other symptoms of psychosis. The patient was motivated to continue taking medication with a goal of continued improvement in mental health.    The patient reports their target psychiatric symptoms of depression and anxiety responded well to the psychiatric medications, and the patient reports overall benefit other psychiatric hospitalization. Supportive psychotherapy was provided to the patient. The patient also participated in regular group therapy while hospitalized. Coping skills, problem solving as well as relaxation therapies were also part of the unit programming.   Labs were reviewed with the patient, and abnormal results were discussed with the patient.   The patient is able to verbalize their individual safety plan to this provider.   # It is recommended to the patient to continue psychiatric medications as prescribed, after discharge from the hospital.     # It is recommended to the patient to follow up with your outpatient psychiatric provider and PCP.   # It was discussed with the patient, the impact of alcohol, drugs, tobacco have been there overall psychiatric and medical wellbeing, and total abstinence from substance use was recommended the patient.ed.   # Prescriptions  provided or sent directly to preferred pharmacy at discharge. Patient agreeable to plan. Given opportunity to ask questions. Appears to feel comfortable with discharge.    # In the event of worsening symptoms, the patient is instructed to call the crisis hotline, 911 and or go to the nearest ED for appropriate evaluation and treatment of symptoms. To follow-up with primary care provider for other medical issues, concerns and or health care needs   # Patient was discharged Home with a plan to follow up as noted below.   Physical Findings: AIMS:  , ,  ,  ,    CIWA:    COWS:     Musculoskeletal: Strength & Muscle Tone: within normal limits Gait & Station: normal Patient leans: N/A   Psychiatric Specialty Exam:   Presentation  General Appearance:  Appropriate for Environment; Casual; Fairly Groomed   Eye Contact: Good   Speech: Clear and Coherent; Normal Rate (extensively verbose vocabulary)   Speech Volume: Normal   Handedness: -- (not assessed)     Mood and Affect  Mood: -- (feeling good)   Affect: Appropriate; Congruent     Thought Process  Thought Processes: Coherent; Goal Directed; Linear   Descriptions of Associations:Intact   Orientation:Full (Time, Place and Person)   Thought Content:Logical   History of Schizophrenia/Schizoaffective disorder:No   Duration of Psychotic Symptoms:Less than six months   Hallucinations:Hallucinations: None   Ideas of Reference:None   Suicidal Thoughts:Suicidal Thoughts: No   Homicidal Thoughts:Homicidal Thoughts: No     Sensorium  Memory: Immediate Good; Recent Good; Remote Good   Judgment: Good   Insight: Good     Executive Functions  Concentration: Good   Attention Span: Good   Recall: Good   Fund of Knowledge: Good   Language: Good     Psychomotor Activity  Psychomotor Activity: Psychomotor Activity: Normal     Assets  Assets: Communication Skills; Desire for Improvement; Social  Support; Housing; Physical Health; Intimacy; Transportation; Vocational/Educational     Sleep  Sleep: Sleep: Poor       Physical Exam: Physical Exam Constitutional:      General: She is not in acute distress.    Appearance: Normal appearance. She is not ill-appearing.  HENT:     Head: Normocephalic and atraumatic.  Pulmonary:     Effort: Pulmonary effort is normal. No respiratory distress.  Skin:    General: Skin is warm and dry.  Neurological:     General: No focal deficit present.     Mental Status: She is alert.      Review of Systems  Constitutional:  Negative for chills and fever.  Respiratory:  Negative for shortness of breath.   Cardiovascular:  Negative for chest pain.  Neurological:  Negative for dizziness and headaches.  Psychiatric/Behavioral:  Negative for depression, hallucinations, memory loss, substance abuse and suicidal ideas. The patient is not nervous/anxious and does not have insomnia.     Blood pressure (!) 105/58, pulse 63, temperature 98.5 F (36.9 C), temperature source Oral, resp. rate 16, height 5\' 7"  (1.702 m), weight 65.2 kg, SpO2 97%. Body mass index is 22.52 kg/m.   Social History   Tobacco Use  Smoking Status Every Day   Current packs/day: 1.00   Types: Cigarettes  Smokeless Tobacco Current   Types: Chew   Tobacco Cessation:  A prescription for an FDA-approved tobacco cessation medication provided at discharge   Blood Alcohol level:  Lab Results  Component Value Date   [facility] <10 08/30/2022   ETH <10 05/18/2022    Metabolic Disorder Labs:  Lab Results  Component Value Date   HGBA1C 5.3 08/30/2022   MPG 105.41 08/30/2022   MPG 91.06 07/05/2022   Lab Results  Component Value Date   PROLACTIN 43.7 (H) 08/31/2022   PROLACTIN 34.9 (H) 08/30/2022   Lab Results  Component Value Date   CHOL 205 (H) 08/30/2022   TRIG 43 08/30/2022   HDL 86 08/30/2022   CHOLHDL 2.4 08/30/2022   VLDL 9 08/30/2022   LDLCALC 110 (H) 08/30/2022    LDLCALC 67 07/05/2022    See Psychiatric Specialty Exam and Suicide Risk Assessment completed by Attending Physician prior to discharge.  Discharge destination:  Home  Is patient on multiple antipsychotic therapies at discharge:  No   Has Patient had three or more failed trials of antipsychotic monotherapy by history:  No  Recommended Plan for Multiple Antipsychotic Therapies: NA  Discharge Instructions     Diet - low sodium heart healthy   Complete by: As directed    Increase activity slowly  Complete by: As directed       Allergies as of 09/07/2022       Reactions   Gluten Meal Other (See Comments)   Intolerance - Will Not Trigger Allergy Alert        Medication List     STOP taking these medications    levothyroxine 100 MCG tablet Commonly known as: SYNTHROID   oxyCODONE-acetaminophen 5-325 MG tablet Commonly known as: PERCOCET/ROXICET   penicillin v potassium 500 MG tablet Commonly known as: VEETID   risperiDONE 2 MG tablet Commonly known as: RISPERDAL       TAKE these medications      Indication  busPIRone 10 MG tablet Commonly known as: BUSPAR Take 1 tablet (10 mg total) by mouth 3 (three) times daily.  Indication: Anxiety Disorder   cariprazine 3 MG capsule Commonly known as: VRAYLAR Take 1 capsule (3 mg total) by mouth daily. Start taking on: September 08, 2022  Indication: Depressive Phase of Manic-Depression   chlorhexidine 0.12 % solution Commonly known as: PERIDEX Use as directed 15 mLs in the mouth or throat 2 (two) times daily. Rinse for 30 seconds after brushing, then spit.  Indication: Swelling of the Gums, Gum Inflammation   hydrOXYzine 25 MG tablet Commonly known as: ATARAX Take 1 tablet (25 mg total) by mouth daily as needed for up to 14 days for anxiety. What changed: when to take this  Indication: Feeling Anxious   ibuprofen 800 MG tablet Commonly known as: ADVIL Take 800 mg by mouth every 6 (six) hours as needed (For  pain).  Indication: Pain   lamoTRIgine 25 MG tablet Commonly known as: LAMICTAL Take 3 tablets (75 mg total) by mouth 2 (two) times daily. What changed:  how much to take when to take this  Indication: Manic-Depression   nicotine polacrilex 2 MG gum Commonly known as: NICORETTE Take 1 each (2 mg total) by mouth as needed for smoking cessation.  Indication: Nicotine Addiction         Follow-up Information     [facility] Follow up.   Why: Please call to schedule an appointment with your provider Kerry Fort, as we have been unable to contact at either phone number. Contact information: [address] [place], [place] [address] Phone: [phone] or [address] [place], [place] [address]        [facility] Go to.   Specialty: Behavioral Health Why: Please go to this provider for medication management services on Monday through Friday, arrive at 7:00 am for same day services. Contact information: [address] [place] [address] [phone]               Plan Of Care/Follow-up recommendations:  Activity: as tolerated   Diet: heart healthy   Other: -Follow-up with your outpatient psychiatric provider -instructions on appointment date, time, and address (location) are provided to you in discharge paperwork.   -Take your psychiatric medications as prescribed at discharge - instructions are provided to you in the discharge paperwork   -Follow-up with outpatient primary care doctor and other specialists -for management of preventative medicine and chronic medical disease, including:  Hashimoto's thyroiditis, TSH: 1.378 on 08/30/2022   -Testing: Follow-up with outpatient provider for abnormal lab results:  Prolactin    -Recommend abstinence from alcohol, tobacco, and other illicit drug use at discharge.    -If your psychiatric symptoms recur, worsen, or if you have side effects to your psychiatric medications,  call  your outpatient psychiatric provider, 911, [phone] or go to the nearest emergency department.   -If suicidal thoughts recur, call your outpatient psychiatric provider, 911, [phone] or go to the nearest emergency department.   Signed: Dr. Liston Alba, MD PGY-2, Psychiatry Residency  09/07/2022, 12:22 PM

## 2022-09-07 NOTE — [facility] Group Notes (Signed)
Spiritual care group on grief and loss facilitated by Chaplain Dyanne Carrel, [facility]  Group Goal: Support / Education around grief and loss  Members engage in facilitated group support and psycho-social education.  Group Description:  Following introductions and group rules, group members engaged in facilitated group dialogue and support around topic of loss, with particular support around experiences of loss in their lives. Group Identified types of loss (relationships / self / things) and identified patterns, circumstances, and changes that precipitate losses. Reflected on thoughts / feelings around loss, normalized grief responses, and recognized variety in grief experience. Group encouraged individual reflection on safe space and on the coping skills that they are already utilizing.  Group drew on Adlerian / Rogerian and narrative framework  Patient Progress: Jennifer York attended group and actively engaged and participated in group conversation and activities.  She asked insightful questions and her comments demonstrated good insight into the topic.  She shared about the loss of 2 friendships that are very painful.

## 2022-09-07 NOTE — Progress Notes (Signed)
   09/07/22 0900  Psych Admission Type (Psych Patients Only)  Admission Status Voluntary  Psychosocial Assessment  Patient Complaints Anxiety;Depression  Eye Contact Fair  Facial Expression Flat  Affect Flat  Speech Logical/coherent  Interaction Assertive  Motor Activity Slow  Appearance/Hygiene Unremarkable  Behavior Characteristics Cooperative  Mood Depressed;Anxious  Thought Process  Coherency WDL  Content WDL  Delusions None reported or observed  Perception WDL  Hallucination None reported or observed  Judgment WDL  Confusion None  Danger to Self  Current suicidal ideation? Denies  Self-Injurious Behavior No self-injurious ideation or behavior indicators observed or expressed   Agreement Not to Harm Self Yes  Description of Agreement verbal  Danger to Others  Danger to Others None reported or observed   Denies SI/HI/AVH. Contracts for safety. Calm and cooperative, Pt scheduled to discharge today.

## 2022-09-07 NOTE — Progress Notes (Cosign Needed)
[facility] MD Progress Note  09/07/2022 11:03 AM Jennifer York  MRN:  [id number]  Principal Problem: MDD (major depressive disorder), recurrent severe, without psychosis (HCC) Diagnosis: Principal Problem:   MDD (major depressive disorder), recurrent severe, without psychosis (HCC)   Reason for Admission:  Jennifer Looman is a 32 year old female with a past psychiatric history of MDD, GAD, recent Schizoaffective disorder (bipolar type) diagnosis, Cannabis-induced psychosis, Polysubstance use disorder (tobacco, alcohol, LSD, mushroom) and 2 prior psychiatric hospitalizations (most recent June 2024) who presented to [facility] voluntarily on 08/30/2022 with complaints of active suicide ideation with means to carry out plan. Her UDS was positive for THC on admission.  (admitted on 08/30/2022, total  LOS: 8 days )   Yesterday, the psychiatry team made following recommendations:  Starting Lamictal 75 mg Q12Hrs today for mood stabilization  Scheduled trazodone 50 mg nightly for insomnia Starting vraylar 3.0 mg today for bipolar depression and mood stabilization Risperdal discontinued on admission due to galactorrhea and amenorrhea Prolactin on 08/31/2022 - 43.7  May repeat prolactin level by discharge -- The risks/benefits/side-effects/alternatives to this medication were discussed in detail with the patient and time was given for questions. The patient consents to medication trial.              -- Metabolic profile and EKG monitoring obtained while on an atypical antipsychotic    Information Obtained Today During Patient Interview: Jennifer states that she did not sleep very well and attributes this to being in a hospital environment. She declined trazadone last night and says that she did this because the prior two nights, it did not help her sleep. Today, she feels well but notes that one ofher goals upon admission was to improve her motivation for tasks such as "showering daily, brushing teeth,  reading more and watching movies." The patient is still able to do these things, but has a low level of initiative to do them. She states that she likes her recently-established tele-health therapist with whom she is able to discuss these motivational factors. Jennifer says that her appetite is well and that she has not been restricting her intake. She denies thoughts of harming herself or committing suicide, nor does she have thoughts of harming others. Jennifer states she has not had thoughts of suicide in about 3-4 days. The patient denies auditory or visual hallucinations. When asked to elaborate on the reported "dissociation" that she informed staff about, the patient says that it is something that she has experienced for almost all of her life and that she experiences it daily. The patient describes "sinking into my thoughts" and "losing focus" for a period while feeling distant to the environment around her. When prompted about derealization or depersonalization she states that she and her environment feel real and palpable, but that she is lost in thought and feels specifically isolated from the environment around her. Jennifer is not able to describe the timing or duration of these episodes and is not able to describe what she is thinking about during the episode and says "I'm not sure, I'm thinking so many things." She is not able to identify a trigger or specific concern/thought during episodes. Jennifer states that in the past she has felt that she is watching herself and the world around her from the 3rd person, but that this has not happened recently or during this hospitalization. The patient asks if this could be more along the lines of attention problem, such as ADHD. Jennifer is comfortable with  [place] calling James/Thadeus today. She feels safe with Jennifer, would go to him if she felt thoughts of self harm, suicidal ideation, or homicidal ideation, and does not have access to weapons. The patient asks about  trazadone and hydroxyzine for discharge as well as a refill of her medications as they run out after discharge.  Pertinent information discussed during bed progression: Patient declined trazodone. Described dissociation yesterday. We are set to discharge today if she is still doing well and [name] (partner) is comfortable.   Past Psychiatric History: Current Psychiatrist: [facility], started a month ago, has had 2 visits, patient is unsure if she would like to continue working with them. Current Therapist: [facility] in [place] Previous Psych Diagnoses: MDD, GAD, Schizoaffective disorder (bipolar type), Tic disorder Current psychiatric medications: Lamictal, risperidone Psychiatric medication history: Wellbutrin, Zoloft, Abilify Prior inpatient treatment: 2 (2017, 06/2022) Current/prior outpatient treatment: risperidone 1mg  daily, lamotrigine 25 mg BID Prior rehab hx: none on chart review Psychotherapy hx: Futures trader with [facility] History of suicide: 2 (2017, 08/2022) History of homicide or aggression: denies Psychiatric medication compliance history: compliant per patient Neuromodulation history: none on chart review  Past Medical History:  Past Medical History:  Diagnosis Date   Family history of adverse reaction to anesthesia    father has woken up during surgery   Medical history non-contributory    Family History:  Family History  Problem Relation Age of Onset   Depression Mother    Depression Father    Depression Brother    Family Psychiatric History: Medical: father has a heart condition Psych: anxiety, depression Psych Rx: none on chart review SA/HA: father used to use cannabis Substance use family hx:  Social History:  Living: in apartment with boyfriend, Thadius. Family (mother, father. Brother) lives 30 min away. 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 Work: Unemployed Finances:  Currently depends on boyfriend Marital Status: Single Children: Denies   Abuse: Denies Armed forces operational officer: Denies Hotel manager: Denies  Current Medications: Current Facility-Administered Medications  Medication Dose Route Frequency Provider Last Rate Last Admin   acetaminophen (TYLENOL) tablet 650 mg  650 mg Oral Q6H PRN Sunday Corn, NP       alum & mag hydroxide-simeth (MAALOX/MYLANTA) 200-200-20 MG/5ML suspension 30 mL  30 mL Oral Q4H PRN Sunday Corn, NP       busPIRone (BUSPAR) tablet 10 mg  10 mg Oral TID Sarita Bottom, MD   10 mg at 09/07/22 0900   cariprazine (VRAYLAR) capsule 3 mg  3 mg Oral Daily Carrion-Carrero, Margely, MD   3 mg at 09/07/22 0900   diphenhydrAMINE (BENADRYL) capsule 50 mg  50 mg Oral TID PRN Sunday Corn, NP       Or   diphenhydrAMINE (BENADRYL) injection 50 mg  50 mg Intramuscular TID PRN Sunday Corn, NP       haloperidol (HALDOL) tablet 5 mg  5 mg Oral TID PRN Sunday Corn, NP       Or   haloperidol lactate (HALDOL) injection 5 mg  5 mg Intramuscular TID PRN Sunday Corn, NP       hydrOXYzine (ATARAX) tablet 25 mg  25 mg Oral TID PRN Bobbitt, Shalon E, NP   25 mg at 09/04/22 1105   lamoTRIgine (LAMICTAL) tablet 75 mg  75 mg Oral BID Carrion-Carrero, Margely, MD   75 mg at 09/07/22 0900   LORazepam (ATIVAN) tablet 2 mg  2 mg Oral  TID PRN Sunday Corn, NP       Or   LORazepam (ATIVAN) injection 2 mg  2 mg Intramuscular TID PRN Sunday Corn, NP       magnesium hydroxide (MILK OF MAGNESIA) suspension 30 mL  30 mL Oral Daily PRN Sunday Corn, NP       nicotine polacrilex (NICORETTE) gum 2 mg  2 mg Oral PRN Abbott Pao, Nadir, MD   2 mg at 09/07/22 1056   traZODone (DESYREL) tablet 50 mg  50 mg Oral QHS Carrion-Carrero, Margely, MD   50 mg at 09/04/22 2046    Lab Results: No results found for this or any previous visit (from the past 48 hour(s)).  Blood Alcohol level:  Lab Results  Component Value Date   ETH <10 08/30/2022   ETH  <10 05/18/2022    Metabolic Labs: Lab Results  Component Value Date   HGBA1C 5.3 08/30/2022   MPG 105.41 08/30/2022   MPG 91.06 07/05/2022   Lab Results  Component Value Date   PROLACTIN 43.7 (H) 08/31/2022   PROLACTIN 34.9 (H) 08/30/2022   Lab Results  Component Value Date   CHOL 205 (H) 08/30/2022   TRIG 43 08/30/2022   HDL 86 08/30/2022   CHOLHDL 2.4 08/30/2022   VLDL 9 08/30/2022   LDLCALC 110 (H) 08/30/2022   LDLCALC 67 07/05/2022    Sleep:Sleep: Poor   Physical Findings: AIMS: Not formally assessed  CIWA:    COWS:     Psychiatric Specialty Exam:  Presentation  General Appearance: Appropriate for Environment; Casual; Fairly Groomed  Eye Contact:Good  Speech:Clear and Coherent; Normal Rate (extensively verbose vocabulary)  Speech Volume:Normal  Handedness:-- (not assessed)   Mood and Affect  Mood:-- (feeling good)  Affect:Appropriate; Congruent   Thought Process  Thought Processes:Coherent; Goal Directed; Linear  Descriptions of Associations:Intact  Orientation:Full (Time, Place and Person)  Thought Content:Logical  History of Schizophrenia/Schizoaffective disorder:No  Duration of Psychotic Symptoms:Less than six months  Hallucinations:Hallucinations: None  Ideas of Reference:None  Suicidal Thoughts:Suicidal Thoughts: No  Homicidal Thoughts:Homicidal Thoughts: No   Sensorium  Memory:Immediate Good; Recent Good; Remote Good  Judgment:Good  Insight:Good   Executive Functions  Concentration:Good  Attention Span:Good  Recall:Good  Fund of Knowledge:Good  Language:Good   Psychomotor Activity  Psychomotor Activity:Psychomotor Activity: Normal   Assets  Assets:Communication Skills; Desire for Improvement; Social Support; Housing; Physical Health; Intimacy; Transportation; Vocational/Educational   Sleep  Sleep:Sleep: Poor    Physical Exam: Physical Exam Review of Systems  Constitutional:  Negative for fever.   Psychiatric/Behavioral:  Negative for depression, hallucinations, memory loss and suicidal ideas.        Positive for Episodes of feeling isolated from environment and Poor sleep.   Blood pressure (!) 105/58, pulse 63, temperature 98.5 F (36.9 C), temperature source Oral, resp. rate 16, height 5\' 7"  (1.702 m), weight 65.2 kg, SpO2 97%. Body mass index is 22.52 kg/m.  Treatment Plan Summary: Daily contact with patient to assess and evaluate symptoms and progress in treatment, Medication management, and Plan for discharge once confirmed by the patient's boyfriend [name]   ASSESSMENT: Jennifer Burmester is a 32 year old female with a past psychiatric history of MDD, GAD, recent Schizoaffective disorder (bipolar type) diagnosis, Cannabis-induced psychosis, Polysubstance use disorder (tobacco, alcohol, LSD, mushroom) and 2 prior psychiatric hospitalizations (most recent June 2024) who presented to [facility] voluntarily on 8/7 with complaints of active suicide ideation with means to carry out plan.    Patient's psychiatric  symptoms of depression and anxiety have significantly improved since admission.  No suicidal ideations reported over 48 hours.  Will continue to plan for discharge, pending confirmation by patient's boyfriend.  Diagnoses / Active Problems: Bipolar II disorder, current episode depressed Generalized anxiety disorder Cannabis use disorder Cannabis induced psychosis, cannabis induced mania R/o out eating disorder R/o OCD  PLAN: Safety and Monitoring:             -- Voluntary admission to inpatient psychiatric unit for safety, stabilization and treatment             -- Daily contact with patient to assess and evaluate symptoms and progress in treatment             -- Patient's case to be discussed in multi-disciplinary team meeting             -- Observation Level : q15 minute checks             -- Vital signs:  q12 hours             -- Precautions: suicide, elopement,  and assault   2. Psychiatric Diagnoses and Treatment:  Starting Lamictal 75 mg Q12Hrs today for mood stabilization  Scheduled trazodone 50 mg nightly for insomnia Starting vraylar 3.0 mg today for bipolar depression and mood stabilization Risperdal discontinued on admission due to galactorrhea and amenorrhea Prolactin on 08/31/2022 - 43.7  May repeat prolactin level by discharge -- The risks/benefits/side-effects/alternatives to this medication were discussed in detail with the patient and time was given for questions. The patient consents to medication trial.              -- Metabolic profile and EKG monitoring obtained while on an atypical antipsychotic  BMI: 22.52 kg/m TSH: 1.378 on 8/7 Lipid Panel: Cholesterol 205 and LDL 110 on 8/7 HbgA1c: 5.3 on 8/7 QTc: 456 on 8/7:              -- Encouraged patient to participate in unit milieu and in scheduled group therapies              -- Short Term Goals: Identify what she experiences, senses, and thinks about during her transient episodes of out-of-focusness and isolation. Ability to identify changes in lifestyle to reduce recurrence of condition will improve and Ability to verbalize feelings will improve             -- Long Term Goals: Improvement in symptoms so as ready for discharge Other PRNS Maalox/Mylanta, Tylenol, milk of magnesia, agitation protocol (Benadryl/Haldol/Ativan)        3. Medical Issues Being Addressed:              Tobacco Use Disorder             -- Nicotine patch 21mg /24 hours ordered             -- Smoking cessation encouraged   #Hypothyroidism -- TSH WNL on 08/30/2022   4. Discharge Planning:              -- Social work and case management to assist with discharge planning and identification of hospital follow-up needs prior to discharge             -- Estimated Discharge; Today             -- Discharge Concerns: Need to establish a safety plan; Medication compliance and effectiveness             -- Discharge  Goals: Return home with outpatient referrals for mental health follow-up including medication management/psychotherapy    [name] MS3, [facility] 8/15/202411:03 AM

## 2022-09-07 NOTE — Progress Notes (Signed)
   09/07/22 0615  15 Minute Checks  Location Bedroom  Visual Appearance Calm  Behavior Composed  Sleep (Behavioral Health Patients Only)  Calculate sleep? (Click Yes once per 24 hr at 0600 safety check) Yes  Documented sleep last 24 hours 7.25
